# Patient Record
Sex: Female | Born: 1955 | Race: Black or African American | Hispanic: No | Marital: Married | Smoking: Former smoker
Health system: Southern US, Community
[De-identification: ages and names within clinical notes are randomized; demographics above are authoritative.]

## PROBLEM LIST (undated history)

## (undated) DIAGNOSIS — Z800 Family history of malignant neoplasm of digestive organs: Secondary | ICD-10-CM

## (undated) DIAGNOSIS — M797 Fibromyalgia: Secondary | ICD-10-CM

## (undated) DIAGNOSIS — I10 Essential (primary) hypertension: Secondary | ICD-10-CM

## (undated) DIAGNOSIS — Z8051 Family history of malignant neoplasm of kidney: Secondary | ICD-10-CM

## (undated) DIAGNOSIS — R519 Headache, unspecified: Secondary | ICD-10-CM

## (undated) DIAGNOSIS — Z803 Family history of malignant neoplasm of breast: Secondary | ICD-10-CM

## (undated) DIAGNOSIS — E785 Hyperlipidemia, unspecified: Secondary | ICD-10-CM

## (undated) DIAGNOSIS — D649 Anemia, unspecified: Secondary | ICD-10-CM

## (undated) DIAGNOSIS — C801 Malignant (primary) neoplasm, unspecified: Secondary | ICD-10-CM

## (undated) DIAGNOSIS — M7070 Other bursitis of hip, unspecified hip: Secondary | ICD-10-CM

## (undated) DIAGNOSIS — K219 Gastro-esophageal reflux disease without esophagitis: Secondary | ICD-10-CM

## (undated) HISTORY — PX: BREAST BIOPSY: SHX20

## (undated) HISTORY — PX: ABDOMINAL HYSTERECTOMY: SHX81

## (undated) HISTORY — DX: Gastro-esophageal reflux disease without esophagitis: K21.9

## (undated) HISTORY — DX: Family history of malignant neoplasm of breast: Z80.3

## (undated) HISTORY — DX: Family history of malignant neoplasm of digestive organs: Z80.0

## (undated) HISTORY — DX: Other bursitis of hip, unspecified hip: M70.70

## (undated) HISTORY — DX: Hyperlipidemia, unspecified: E78.5

## (undated) HISTORY — DX: Family history of malignant neoplasm of kidney: Z80.51

---

## 2006-12-11 ENCOUNTER — Ambulatory Visit: Payer: Self-pay | Admitting: Cardiology

## 2006-12-12 ENCOUNTER — Ambulatory Visit: Payer: Self-pay | Admitting: Cardiology

## 2006-12-12 ENCOUNTER — Observation Stay (HOSPITAL_COMMUNITY): Admission: AD | Admit: 2006-12-12 | Discharge: 2006-12-13 | Payer: Self-pay | Admitting: Cardiology

## 2006-12-12 HISTORY — PX: LEFT HEART CATH: SHX5946

## 2010-09-26 NOTE — Discharge Summary (Signed)
Christy Lynch, Christy Lynch         ACCOUNT NO.:  [id number]   MEDICAL RECORD NO.:  [id number]          PATIENT TYPE:  INP   LOCATION:  4741                         FACILITY:  [facility]   PHYSICIAN:  Arturo Morton. Riley Kill, MD, FACCDATE OF BIRTH:  17-Jul-1955   DATE OF ADMISSION:  12/12/2006  DATE OF DISCHARGE:  12/13/2006                               DISCHARGE SUMMARY   PROCEDURES:  1. Cardiac catheterization.  2. Coronary arteriogram.  3. Left ventriculogram.   TIME AT DISCHARGE:  35 minutes.   PRIMARY FINAL DISCHARGE DIAGNOSIS:  Chest pain, cardiac enzymes negative  for myocardial infarction and D-dimer negative for pulmonary embolism.   SECONDARY DIAGNOSES:  1. Hypertension.  2. Hyperlipidemia with a total cholesterol of 212, triglycerides 180,      HDL 34, LDL 142 at [facility] this admission.  3. Gastroesophageal reflux disease.  4. Status post hysterectomy.  5. Bursitis in her hips.  6. Obesity.  7. Remote history of tobacco use.   HOSPITAL COURSE:  Christy Lynch is a 55 year old female with no history  of coronary artery disease.  She went to [facility] for chest  pain on December 11, 2006.  Cardiology consult was called and because of the  fact that her chest pain was relieved by nitroglycerin as well as  multiple cardiac risk factors, it was felt that she needed cardiac  catheterization.  She was transferred to [facility]  for this.   The cardiac catheterization showed normal coronary arteries and normal  EF with no MR.  Dr. Excell Seltzer felt it was likely noncardiac chest pain and  a D-dimer was checked which was within normal limits at 0.31.  On December 13, 2006, Christy Lynch was evaluated by Dr. Riley Kill.  She was  ambulating without chest pain or shortness of breath and her EKG was  nonacute.  She was considered stable for discharge and is to follow up  as an outpatient with Dr. Linna Darner and with cardiology on a p.r.n. basis.  Of note, her a Statin was  changed from Pravachol 40 to  Lipitor 80 but  Christy Lynch prefers to remain on the Pravachol 40 and emphasized diet  compliance and follow up with Dr. Linna Darner.   DISCHARGE INSTRUCTIONS:  1. Her activity level is to be increased gradually.  2. She is to call our office for any problems with the cath site.  3. She is to stick to a low sodium heart healthy diet.  4. She is to follow up with Dr. Andee Lineman as needed.  5. She is to follow up with Dr. Linna Darner and call for an appointment.   DISCHARGE MEDICATIONS:  1. Lisinopril/hydrochlorothiazide 20/12.5 mg daily.  2. Nexium 40 mg a day.  3. Pravastatin 40 mg a day.  4. Naprosyn 500 mg b.i.d.Theodore Demark, PA-C      Arturo Morton. Riley Kill, MD, [facility]  Electronically Signed    RB/MEDQ  D:  12/13/2006  T:  12/14/2006  Job:  [id number]   cc:   [facility] in [place], [name]  Erasmo Downer, MD

## 2010-09-26 NOTE — Cardiovascular Report (Signed)
NAMEAMBERIA, BAYLESS         ACCOUNT NO.:  [id number]   MEDICAL RECORD NO.:  [id number]          PATIENT TYPE:  INP   LOCATION:  NA                           FACILITY:  [facility]   PHYSICIAN:  Veverly Fells. Excell Seltzer, MD  DATE OF BIRTH:  1955-05-29   DATE OF PROCEDURE:  12/12/2006  DATE OF DISCHARGE:                            CARDIAC CATHETERIZATION   PROCEDURE:  Left heart catheterization, selective coronary angiography,  left ventricular angiography, Starclose of the right femoral artery.   INDICATIONS:  Ms. Suddeth is a 55 year old woman with hypertension.  She experienced severe substernal chest pain.  She had a very mildly  elevated troponin at 0.11.  Her symptoms have greatly resolved, but in  the setting of her troponin elevation, she was referred for cardiac  catheterization.   Risks and indications of the procedure were explained to the patient.  Informed consent was obtained.  The right groin was prepped, draped and  anesthetized with 1% lidocaine.  Using the modified Seldinger technique,  a 6-French sheath was placed in the right femoral artery.  Multiple  views of the right and left coronary arteries were taken using standard  Judkins catheters.  Following selective coronary angiography, an angled  pigtail catheter was inserted into the left ventricle, and pressures  were recorded.  A left ventriculogram was performed.  A pullback across  the aortic valve was done at the completion of the procedure.  A  Starclose device was used to seal the femoral arteriotomy.  All catheter  exchanges were performed over a guidewire.  There were no immediate  complications.   FINDINGS:  Aortic pressure 124/68 with a mean of 92, left ventricular  pressure 124/8.   The left mainstem is angiographically normal.  It bifurcates into the  LAD and left circumflex.   The LAD is a large-caliber vessel that courses down and wraps around  left ventricular apex.  There is very small first  diagonal branch and a  medium-size second diagonal branch.  There is no significant  angiographic disease throughout the LAD or any of its diagonal branches.   Left circumflex is a large-caliber vessel, courses down and provides a  large first OM branch that bifurcates into twin vessels.  The true AV  groove circumflex beyond the OM branch is very small.  There is no  significant angiographic disease in the left circumflex.   The right coronary artery is large and dominant.  There is no  significant angiographic stenosis in the proximal mid or distal portion  of the right coronary artery.  Distally, it bifurcates into a PDA and  posterior AV segment which provides 3 posterolateral branches, all of  which are small.  There is no significant angiographic disease in any of  the branch vessels of the right coronary artery.   Left ventricular function is normal.  The LVEF is 60%.  There is no  mitral regurgitation.   ASSESSMENT:  1. Normal coronary arteries.  2. Normal left ventricular function.   Will watch Ms. Boehle overnight and plan on discharge home tomorrow  morning as her symptoms have resolved, and she has normal coronary  arteries.  It is difficult to explain her mild troponin increase in the  setting of widely patent coronary arteries.      Veverly Fells. Excell Seltzer, MD  Electronically Signed     MDC/MEDQ  D:  12/12/2006  T:  12/13/2006  Job:  [id number]   cc:   Learta Codding, MD,FACC  Erasmo Downer, MD

## 2011-02-26 LAB — CBC
HCT: 39.3
MCHC: 34.3
MCV: 86.1
Platelets: 254
WBC: 6.7

## 2011-02-26 LAB — BASIC METABOLIC PANEL
BUN: 12
CO2: 31
Chloride: 101
Creatinine, Ser: 0.9
Glucose, Bld: 96
Potassium: 4

## 2011-02-26 LAB — TSH: TSH: 1.423

## 2013-03-23 ENCOUNTER — Encounter (HOSPITAL_COMMUNITY): Payer: Self-pay | Admitting: Emergency Medicine

## 2013-03-23 ENCOUNTER — Emergency Department (HOSPITAL_COMMUNITY): Payer: PRIVATE HEALTH INSURANCE

## 2013-03-23 ENCOUNTER — Emergency Department (HOSPITAL_COMMUNITY)
Admission: EM | Admit: 2013-03-23 | Discharge: 2013-03-23 | Disposition: A | Discharge status: Home / Self Care | Payer: PRIVATE HEALTH INSURANCE | Attending: Emergency Medicine | Admitting: Emergency Medicine

## 2013-03-23 DIAGNOSIS — R079 Chest pain, unspecified: Secondary | ICD-10-CM

## 2013-03-23 DIAGNOSIS — I10 Essential (primary) hypertension: Secondary | ICD-10-CM | POA: Insufficient documentation

## 2013-03-23 DIAGNOSIS — Z79899 Other long term (current) drug therapy: Secondary | ICD-10-CM | POA: Insufficient documentation

## 2013-03-23 DIAGNOSIS — R0789 Other chest pain: Secondary | ICD-10-CM | POA: Insufficient documentation

## 2013-03-23 DIAGNOSIS — Z87891 Personal history of nicotine dependence: Secondary | ICD-10-CM | POA: Insufficient documentation

## 2013-03-23 HISTORY — DX: Essential (primary) hypertension: I10

## 2013-03-23 LAB — COMPREHENSIVE METABOLIC PANEL
ALT: 25 U/L (ref 0–35)
Alkaline Phosphatase: 93 U/L (ref 39–117)
BUN: 13 mg/dL (ref 6–23)
CO2: 29 mEq/L (ref 19–32)
Calcium: 9.5 mg/dL (ref 8.4–10.5)
GFR calc Af Amer: 81 mL/min — ABNORMAL LOW (ref >90)
GFR calc non Af Amer: 70 mL/min — ABNORMAL LOW (ref >90)
Glucose, Bld: 96 mg/dL (ref 70–99)
Sodium: 142 mEq/L (ref 135–145)

## 2013-03-23 LAB — CBC WITH DIFFERENTIAL/PLATELET
Basophils Absolute: 0 10*3/uL (ref 0.0–0.1)
Basophils Relative: 0 % (ref 0–1)
Hemoglobin: 12.5 g/dL (ref 12.0–15.0)
Lymphocytes Relative: 31 % (ref 12–46)
MCHC: 33.2 g/dL (ref 30.0–36.0)
Neutro Abs: 4.4 10*3/uL (ref 1.7–7.7)
Neutrophils Relative %: 58 % (ref 43–77)
RDW: 13.6 % (ref 11.5–15.5)
WBC: 7.6 10*3/uL (ref 4.0–10.5)

## 2013-03-23 LAB — POCT I-STAT 3, ART BLOOD GAS (G3+)
Patient temperature: 37
pH, Arterial: 7.58 — ABNORMAL HIGH (ref 7.350–7.450)

## 2013-03-23 LAB — POCT I-STAT TROPONIN I

## 2013-03-23 NOTE — ED Notes (Signed)
Patient transported to X-ray 

## 2013-03-23 NOTE — ED Notes (Signed)
Patient is alert and orientedx4.  Patient was explained discharge instructions and they understood them with no questions.   

## 2013-03-23 NOTE — ED Provider Notes (Signed)
CSN: [id number]     Arrival date & time 03/23/13  1413 History   First MD Initiated Contact with Patient 03/23/13 1517     Chief Complaint  Patient presents with  . Chest Pain   (Consider location/radiation/quality/duration/timing/severity/associated sxs/prior Treatment) Patient is a 57 y.o. female presenting with chest pain. The history is provided by the patient.  Chest Pain Pain location:  L chest Associated symptoms: no abdominal pain, no back pain, no headache, no nausea, no numbness, no shortness of breath, not vomiting and no weakness    patient has had left-sided chest pain since Saturday evening. It is constant. It is sort of under her left arm. It is not worse with movement. No difficulty breathing. It is worse with a deep breath. No trauma. She does not smoke. She's on no hormones. No swelling or legs. No recent travel. She had a clean heart cath in 2008.  Past Medical History  Diagnosis Date  . Hypertension    Past Surgical History  Procedure Laterality Date  . Abdominal hysterectomy     No family history on file. History  Substance Use Topics  . Smoking status: Former Games developer  . Smokeless tobacco: Not on file  . Alcohol Use: No   OB History   Grav Para Term Preterm Abortions TAB SAB Ect Mult Living                 Review of Systems  Constitutional: Negative for activity change and appetite change.  Eyes: Negative for pain.  Respiratory: Negative for chest tightness and shortness of breath.   Cardiovascular: Positive for chest pain. Negative for leg swelling.  Gastrointestinal: Negative for nausea, vomiting, abdominal pain and diarrhea.  Genitourinary: Negative for flank pain.  Musculoskeletal: Negative for back pain and neck stiffness.  Skin: Negative for rash.  Neurological: Negative for weakness, numbness and headaches.  Psychiatric/Behavioral: Negative for behavioral problems.    Allergies  Flagyl  Home Medications   Current Outpatient Rx  Name   Route  Sig  Dispense  Refill  . allopurinol (ZYLOPRIM) 100 MG tablet   Oral   Take 100 mg by mouth every evening.         Marland Kitchen atenolol (TENORMIN) 50 MG tablet   Oral   Take 50 mg by mouth every evening.         . cyclobenzaprine (FLEXERIL) 10 MG tablet   Oral   Take 10 mg by mouth at bedtime as needed for muscle spasms.         . DULoxetine (CYMBALTA) 60 MG capsule   Oral   Take 60 mg by mouth every evening.         Marland Kitchen esomeprazole (NEXIUM) 40 MG capsule   Oral   Take 40 mg by mouth daily at 12 noon.         Marland Kitchen lisinopril-hydrochlorothiazide (PRINZIDE,ZESTORETIC) 20-12.5 MG per tablet   Oral   Take 1 tablet by mouth daily.         . Multiple Vitamin (MULTIVITAMIN WITH MINERALS) TABS tablet   Oral   Take 1 tablet by mouth every evening.          BP 144/89  Pulse 65  Resp 18  SpO2 100% Physical Exam  Nursing note and vitals reviewed. Constitutional: She is oriented to person, place, and time. She appears well-developed and well-nourished.  HENT:  Head: Normocephalic and atraumatic.  Eyes: EOM are normal. Pupils are equal, round, and reactive to light.  Neck: Normal  range of motion. Neck supple.  Cardiovascular: Normal rate, regular rhythm and normal heart sounds.   No murmur heard. Pulmonary/Chest: Effort normal and breath sounds normal. No respiratory distress. She has no wheezes. She has no rales. She exhibits tenderness.  Tenderness to left chest wall near her axillary region. No rash. No deformity  Abdominal: Soft. Bowel sounds are normal. She exhibits no distension. There is no tenderness. There is no rebound and no guarding.  Musculoskeletal: Normal range of motion.  Neurological: She is alert and oriented to person, place, and time. No cranial nerve deficit.  Skin: Skin is warm and dry.  Psychiatric: She has a normal mood and affect. Her speech is normal.    ED Course  Procedures (including critical care time) Labs Review Labs Reviewed   COMPREHENSIVE METABOLIC PANEL - Abnormal; Notable for the following:    GFR calc non Af Amer 70 (*)    GFR calc Af Amer 81 (*)    All other components within normal limits  POCT I-STAT 3, BLOOD GAS (G3+) - Abnormal; Notable for the following:    pH, Arterial 7.580 (*)    pCO2 arterial 22.5 (*)    pO2, Arterial 55.0 (*)    All other components within normal limits  PRO B NATRIURETIC PEPTIDE  CBC WITH DIFFERENTIAL  POCT I-STAT TROPONIN I   Imaging Review Dg Chest 2 View  03/23/2013   CLINICAL DATA:  History of tobacco use. Now complaining of left-sided chest discomfort.  EXAM: CHEST  2 VIEW  COMPARISON:  None.  FINDINGS: The lungs are adequately inflated and clear. The cardiac silhouette is normal in size. The pulmonary vascularity is not engorged. There is mild tortuosity of the descending thoracic aorta. There is no pleural effusion or pneumothorax or pneumomediastinum. The observed portions of the bony thorax are normal.  IMPRESSION: There is no evidence of pneumonia or CHF nor other acute cardiopulmonary abnormality.   Electronically Signed   By: David  [place]   On: 03/23/2013 15:28    EKG Interpretation     Ventricular Rate:  66 PR Interval:  166 QRS Duration: 82 QT Interval:  394 QTC Calculation: 413 R Axis:   9 Text Interpretation:  Normal sinus rhythm Nonspecific T wave abnormality Abnormal ECG            MDM   1. Chest pain    Patient with chest pain. Doubt cardiac cause. 7 years ago had a clean cath. No risk factors for pulmonary embolism. Will discharged home.    Juliet Rude. Rubin Payor, MD 03/23/13 [phone]

## 2013-03-23 NOTE — ED Notes (Signed)
Patient is resting comfortably. 

## 2013-03-23 NOTE — ED Notes (Signed)
Pt states constant chest pain to left chest to left shoulder, left neck, and left arm.  Pt reports sob.  No nausea or diaphoresis.

## 2013-05-18 ENCOUNTER — Other Ambulatory Visit: Payer: Self-pay | Admitting: Family Medicine

## 2013-05-18 DIAGNOSIS — R110 Nausea: Secondary | ICD-10-CM

## 2013-05-18 DIAGNOSIS — R1011 Right upper quadrant pain: Secondary | ICD-10-CM

## 2013-05-18 DIAGNOSIS — R1013 Epigastric pain: Secondary | ICD-10-CM

## 2013-05-19 ENCOUNTER — Ambulatory Visit
Admission: RE | Admit: 2013-05-19 | Discharge: 2013-05-19 | Disposition: A | Discharge status: Home / Self Care | Payer: 59 | Source: Ambulatory Visit | Attending: Family Medicine | Admitting: Family Medicine

## 2013-05-19 DIAGNOSIS — R110 Nausea: Secondary | ICD-10-CM

## 2013-05-19 DIAGNOSIS — R1013 Epigastric pain: Secondary | ICD-10-CM

## 2013-05-19 DIAGNOSIS — R1011 Right upper quadrant pain: Secondary | ICD-10-CM

## 2013-05-21 ENCOUNTER — Other Ambulatory Visit: Payer: Self-pay | Admitting: Family Medicine

## 2013-05-21 DIAGNOSIS — K769 Liver disease, unspecified: Secondary | ICD-10-CM

## 2013-05-25 ENCOUNTER — Ambulatory Visit
Admission: RE | Admit: 2013-05-25 | Discharge: 2013-05-25 | Disposition: A | Discharge status: Home / Self Care | Payer: 59 | Source: Ambulatory Visit | Attending: Family Medicine | Admitting: Family Medicine

## 2013-05-25 DIAGNOSIS — K769 Liver disease, unspecified: Secondary | ICD-10-CM

## 2013-05-25 MED ORDER — IOHEXOL 300 MG/ML  SOLN
125.0000 mL | Freq: Once | INTRAMUSCULAR | Status: AC | PRN
  Administered 2013-05-25: 125 mL via INTRAVENOUS

## 2013-05-25 MED ORDER — IOHEXOL 300 MG/ML  SOLN: 100.0000 mL | Freq: Once | INTRAMUSCULAR | Status: DC | PRN

## 2013-05-27 ENCOUNTER — Other Ambulatory Visit (HOSPITAL_COMMUNITY)
Admission: RE | Admit: 2013-05-27 | Discharge: 2013-05-27 | Disposition: A | Discharge status: Home / Self Care | Payer: 59 | Source: Ambulatory Visit | Attending: Family Medicine | Admitting: Family Medicine

## 2013-05-27 ENCOUNTER — Other Ambulatory Visit: Payer: Self-pay | Admitting: Family Medicine

## 2013-05-27 DIAGNOSIS — Z124 Encounter for screening for malignant neoplasm of cervix: Secondary | ICD-10-CM | POA: Insufficient documentation

## 2013-06-04 ENCOUNTER — Other Ambulatory Visit: Payer: Self-pay | Admitting: Gastroenterology

## 2013-06-30 ENCOUNTER — Institutional Professional Consult (permissible substitution): Payer: 59 | Admitting: Cardiology

## 2013-07-02 ENCOUNTER — Institutional Professional Consult (permissible substitution): Payer: 59 | Admitting: Cardiology

## 2013-07-02 ENCOUNTER — Encounter: Payer: Self-pay | Admitting: *Deleted

## 2013-07-02 ENCOUNTER — Encounter: Payer: Self-pay | Admitting: Cardiology

## 2013-07-02 ENCOUNTER — Ambulatory Visit (INDEPENDENT_AMBULATORY_CARE_PROVIDER_SITE_OTHER): Payer: 59 | Admitting: Cardiology

## 2013-07-02 VITALS — BP 128/82 | HR 61 | Ht 64.0 in | Wt 235.0 lb

## 2013-07-02 DIAGNOSIS — R079 Chest pain, unspecified: Secondary | ICD-10-CM

## 2013-07-02 DIAGNOSIS — I10 Essential (primary) hypertension: Secondary | ICD-10-CM | POA: Insufficient documentation

## 2013-07-02 DIAGNOSIS — E785 Hyperlipidemia, unspecified: Secondary | ICD-10-CM

## 2013-07-02 NOTE — Patient Instructions (Signed)
**Note De-Identified Christy Lynch Obfuscation** Your physician has requested that you have an exercise tolerance test. For further information please visit [email or web address]. Please also follow instruction sheet, as given.  Your physician recommends that you schedule a follow-up appointment in: after test

## 2013-07-02 NOTE — Progress Notes (Signed)
Patient ID: Christy Lynch, female   DOB: 1956-04-19, 58 y.o.   MRN: [id number]     Patient Name: Christy Lynch Date of Encounter: 07/02/2013  Primary Care Provider:  Antony Blackbird, MD Primary Cardiologist:  Ena Dawley, H  Problem List   Past Medical History  Diagnosis Date  . Hypertension   . Other and unspecified hyperlipidemia   . Esophageal reflux   . Bursitis of hip    Past Surgical History  Procedure Laterality Date  . Abdominal hysterectomy    . Left heart cath  12/12/2006   Allergies  Allergies  Allergen Reactions  . Flagyl [Metronidazole] Nausea And Vomiting and Other (See Comments)    headache   HPI  A very pleasant 58 year old female with prior medical history of well controlled hypertension and hyperlipidemia. The patient presented to the ER in November of 2014 with left-sided chest pain that was constant for about a week radiating to her back and right arm. The pain was not positional and nonexertional. There was no clear exacerbating or alleviating factor. The patient is fairly active taking care of her granddaughter, and she denies any palpitations, dizziness, shortness of breath orthopnea, or lower extremity edema. She has been followed for her hypertension and hyperlipidemia by her primary care physician. She states that her lipids at the last checkup were at goal. She had a clean heart cath in 2008. She has no family history of coronary artery disease. The patient smoked but quit 20 years ago.  Home Medications  Prior to Admission medications   Medication Sig Start Date End Date Taking? Authorizing Provider  allopurinol (ZYLOPRIM) 100 MG tablet Take 100 mg by mouth every evening.   Yes Historical Provider, MD  atenolol (TENORMIN) 50 MG tablet Take 50 mg by mouth every evening.   Yes Historical Provider, MD  cyclobenzaprine (FLEXERIL) 10 MG tablet Take 10 mg by mouth at bedtime as needed for muscle spasms.   Yes Historical Provider, MD    DULoxetine (CYMBALTA) 60 MG capsule Take 60 mg by mouth every evening.   Yes Historical Provider, MD  esomeprazole (NEXIUM) 40 MG capsule Take 40 mg by mouth daily at 12 noon.   Yes Historical Provider, MD  lisinopril-hydrochlorothiazide (PRINZIDE,ZESTORETIC) 20-12.5 MG per tablet Take 1 tablet by mouth daily.   Yes Historical Provider, MD  Multiple Vitamin (MULTIVITAMIN WITH MINERALS) TABS tablet Take 1 tablet by mouth every evening.   Yes Historical Provider, MD    Family History  Family History  Problem Relation Age of Onset  . Hypertension Mother   . Stroke Mother   . Kidney disease Father     Social History  History   Social History  . Marital Status: Married    Spouse Name: N/A    Number of Children: N/A  . Years of Education: N/A   Occupational History  . Not on file.   Social History Main Topics  . Smoking status: Former Research scientist (life sciences)  . Smokeless tobacco: Not on file  . Alcohol Use: No  . Drug Use: No  . Sexual Activity: Not on file   Other Topics Concern  . Not on file   Social History Narrative  . No narrative on file     Review of Systems, as per HPI, otherwise negative General:  No chills, fever, night sweats or weight changes.  Cardiovascular:  No chest pain, dyspnea on exertion, edema, orthopnea, palpitations, paroxysmal nocturnal dyspnea. Dermatological: No rash, lesions/masses Respiratory: No cough, dyspnea Urologic: No hematuria,  dysuria Abdominal:   No nausea, vomiting, diarrhea, bright red blood per rectum, melena, or hematemesis Neurologic:  No visual changes, wkns, changes in mental status. All other systems reviewed and are otherwise negative except as noted above.  Physical Exam  Blood pressure 128/82, pulse 61, height 5\' 4"  (1.626 m), weight 235 lb (106.595 kg).  General: Pleasant, NAD Psych: Normal affect. Neuro: Alert and oriented X 3. Moves all extremities spontaneously. HEENT: Normal  Neck: Supple without bruits or JVD. Lungs:  Resp  regular and unlabored, CTA. Heart: RRR no s3, s4, or murmurs. Abdomen: Soft, non-tender, non-distended, BS + x 4.  Extremities: No clubbing, cyanosis or edema. DP/PT/Radials 2+ and equal bilaterally.  Labs:  No results found for this basename: CKTOTAL, CKMB, TROPONINI,  in the last 72 hours Lab Results  Component Value Date   WBC 7.6 03/23/2013   HGB 12.5 03/23/2013   HCT 37.7 03/23/2013   MCV 88.1 03/23/2013   PLT 262 03/23/2013   No results found for this basename: NA, K, CL, CO2, BUN, CREATININE, CALCIUM, LABALBU, PROT, BILITOT, ALKPHOS, ALT, AST, GLUCOSE,  in the last 168 hours No results found for this basename: CHOL, HDL, LDLCALC, TRIG   Lab Results  Component Value Date   DDIMER  Value: 0.31        AT THE INHOUSE ESTABLISHED CUTOFF VALUE OF 0.48 ug/mL FEU, THIS ASSAY HAS BEEN DOCUMENTED IN THE LITERATURE TO HAVE 12/13/2006   No components found with this basename: POCBNP,   Accessory Clinical Findings  Echocardiogram - none  ECG - normal sinus rhythm, normal EKG   Assessment & Plan  58 year old female  1. Atypical chest pain - considering risk factors that include hypertension, hyperlipidemia and obesity we will order an exercise treadmill stress test to rule out ischemia.  2. Hypertension - well controlled on current regimen and will continue.  3. Hyperlipidemia - followed by primary care physician currently on 20 mg of simvastatin daily  Followup in one month.  Ena Dawley, Lemmie Evens, MD, [facility] 07/02/2013, 12:09 PM

## 2013-07-09 ENCOUNTER — Encounter (HOSPITAL_COMMUNITY): Payer: 59

## 2013-07-13 ENCOUNTER — Telehealth (HOSPITAL_COMMUNITY): Payer: Self-pay | Admitting: *Deleted

## 2013-07-17 ENCOUNTER — Ambulatory Visit: Payer: 59 | Admitting: Cardiology

## 2013-07-23 ENCOUNTER — Ambulatory Visit: Payer: 59 | Admitting: Cardiology

## 2013-08-21 ENCOUNTER — Ambulatory Visit (INDEPENDENT_AMBULATORY_CARE_PROVIDER_SITE_OTHER): Payer: BC Managed Care – PPO | Admitting: Cardiology

## 2013-08-21 ENCOUNTER — Encounter: Payer: Self-pay | Admitting: Cardiology

## 2013-08-21 VITALS — BP 152/86 | HR 64 | Ht 65.0 in | Wt 233.0 lb

## 2013-08-21 DIAGNOSIS — R079 Chest pain, unspecified: Secondary | ICD-10-CM

## 2013-08-21 DIAGNOSIS — R42 Dizziness and giddiness: Secondary | ICD-10-CM

## 2013-08-21 MED ORDER — MECLIZINE HCL 25 MG PO TABS: 25.0000 mg | ORAL_TABLET | Freq: Two times a day (BID) | ORAL | Status: DC | PRN

## 2013-08-21 NOTE — Progress Notes (Signed)
Patient ID: Christy Lynch, female   DOB: 08/08/1955, 58 y.o.   MRN: [id number]    Patient Name: Christy Lynch Date of Encounter: 08/21/2013  Primary Care Provider:  Antony Blackbird, MD Primary Cardiologist:  Dorothy Spark  Problem List   Past Medical History  Diagnosis Date  . Hypertension   . Other and unspecified hyperlipidemia   . Esophageal reflux   . Bursitis of hip    Past Surgical History  Procedure Laterality Date  . Abdominal hysterectomy    . Left heart cath  12/12/2006   Allergies  Allergies  Allergen Reactions  . Flagyl [Metronidazole] Nausea And Vomiting and Other (See Comments)    headache   HPI  A very pleasant 58 year old female with prior medical history of well controlled hypertension and hyperlipidemia. The patient presented to the ER in November of 2014 with left-sided chest pain that was constant for about a week radiating to her back and right arm. The pain was not positional and nonexertional. There was no clear exacerbating or alleviating factor. The patient is fairly active taking care of her granddaughter, and she denies any palpitations, dizziness, shortness of breath orthopnea, or lower extremity edema. She has been followed for her hypertension and hyperlipidemia by her primary care physician. She states that her lipids at the last checkup were at goal. She had a clean heart cath in 2008. She has no family history of coronary artery disease. The patient smoked but quit 20 years ago.  The patient is coming after 2 months, she hasn't had a stress test yet as it was canceled to do snow day. In the meanwhile she was prescribed meclizine for vertigo in the ER where she went for dizziness associated with chest discomfort. Meclizine helped her significantly but the symptoms came back since she ran out of it.  Home Medications  Prior to Admission medications   Medication Sig Start Date End Date Taking? Authorizing Provider  allopurinol  (ZYLOPRIM) 100 MG tablet Take 100 mg by mouth every evening.   Yes Historical Provider, MD  atenolol (TENORMIN) 50 MG tablet Take 50 mg by mouth every evening.   Yes Historical Provider, MD  cyclobenzaprine (FLEXERIL) 10 MG tablet Take 10 mg by mouth at bedtime as needed for muscle spasms.   Yes Historical Provider, MD  DULoxetine (CYMBALTA) 60 MG capsule Take 60 mg by mouth every evening.   Yes Historical Provider, MD  esomeprazole (NEXIUM) 40 MG capsule Take 40 mg by mouth daily at 12 noon.   Yes Historical Provider, MD  lisinopril-hydrochlorothiazide (PRINZIDE,ZESTORETIC) 20-12.5 MG per tablet Take 1 tablet by mouth daily.   Yes Historical Provider, MD  Multiple Vitamin (MULTIVITAMIN WITH MINERALS) TABS tablet Take 1 tablet by mouth every evening.   Yes Historical Provider, MD    Family History  Family History  Problem Relation Age of Onset  . Hypertension Mother   . Stroke Mother   . Kidney disease Father     Social History  History   Social History  . Marital Status: Married    Spouse Name: N/A    Number of Children: N/A  . Years of Education: N/A   Occupational History  . Not on file.   Social History Main Topics  . Smoking status: Former Research scientist (life sciences)  . Smokeless tobacco: Not on file  . Alcohol Use: No  . Drug Use: No  . Sexual Activity: Not on file   Other Topics Concern  . Not on file  Social History Narrative  . No narrative on file     Review of Systems, as per HPI, otherwise negative General:  No chills, fever, night sweats or weight changes.  Cardiovascular:  No chest pain, dyspnea on exertion, edema, orthopnea, palpitations, paroxysmal nocturnal dyspnea. Dermatological: No rash, lesions/masses Respiratory: No cough, dyspnea Urologic: No hematuria, dysuria Abdominal:   No nausea, vomiting, diarrhea, bright red blood per rectum, melena, or hematemesis Neurologic:  No visual changes, wkns, changes in mental status. All other systems reviewed and are  otherwise negative except as noted above.  Physical Exam  Blood pressure 152/86, pulse 64, height 5\' 5"  (1.651 m), weight 233 lb (105.688 kg).  General: Pleasant, NAD Psych: Normal affect. Neuro: Alert and oriented X 3. Moves all extremities spontaneously. HEENT: Normal  Neck: Supple without bruits or JVD. Lungs:  Resp regular and unlabored, CTA. Heart: RRR no s3, s4, or murmurs. Abdomen: Soft, non-tender, non-distended, BS + x 4.  Extremities: No clubbing, cyanosis or edema. DP/PT/Radials 2+ and equal bilaterally.  Labs:  No results found for this basename: CKTOTAL, CKMB, TROPONINI,  in the last 72 hours Lab Results  Component Value Date   WBC 7.6 03/23/2013   HGB 12.5 03/23/2013   HCT 37.7 03/23/2013   MCV 88.1 03/23/2013   PLT 262 03/23/2013   No results found for this basename: NA, K, CL, CO2, BUN, CREATININE, CALCIUM, LABALBU, PROT, BILITOT, ALKPHOS, ALT, AST, GLUCOSE,  in the last 168 hours No results found for this basename: CHOL,  HDL,  LDLCALC,  TRIG   Lab Results  Component Value Date   DDIMER  Value: 0.31        AT THE INHOUSE ESTABLISHED CUTOFF VALUE OF 0.48 ug/mL FEU, THIS ASSAY HAS BEEN DOCUMENTED IN THE LITERATURE TO HAVE 12/13/2006   Accessory Clinical Findings  Echocardiogram - none  ECG - normal sinus rhythm, normal EKG   Assessment & Plan  58 year old female  1. Atypical chest pain - considering risk factors that include hypertension, hyperlipidemia and obesity we will reorder a Lexiscan nuclear stress test to rule out ischemia. We will prescribe her meclizine 25 mg po BID.  2. Hypertension - well controlled on current regimen and will continue.  3. Hyperlipidemia - followed by primary care physician currently on 20 mg of simvastatin daily  If negative stress test follow up in 1 year.  Dorothy Spark, MD, [facility] 08/21/2013, 9:53 AM

## 2013-08-21 NOTE — Patient Instructions (Signed)
Your physician has recommended you make the following change in your medication:   1. Start Meclizine 25 mg twice a day as needed for dizziness.   Your physician has requested that you have a lexiscan myoview. For further information please visit [email or web address]. Please follow instruction sheet, as given.  Marland Kitchen

## 2013-09-03 ENCOUNTER — Ambulatory Visit (HOSPITAL_COMMUNITY): Payer: BC Managed Care – PPO | Attending: Cardiology | Admitting: Radiology

## 2013-09-03 VITALS — BP 136/85 | HR 85 | Ht 66.0 in | Wt 228.0 lb

## 2013-09-03 DIAGNOSIS — R079 Chest pain, unspecified: Secondary | ICD-10-CM | POA: Insufficient documentation

## 2013-09-03 MED ORDER — TECHNETIUM TC 99M SESTAMIBI GENERIC - CARDIOLITE
10.0000 | Freq: Once | INTRAVENOUS | Status: AC | PRN
  Administered 2013-09-03: 10 via INTRAVENOUS

## 2013-09-03 MED ORDER — REGADENOSON 0.4 MG/5ML IV SOLN
0.4000 mg | Freq: Once | INTRAVENOUS | Status: AC
  Administered 2013-09-03: 0.4 mg via INTRAVENOUS

## 2013-09-03 MED ORDER — TECHNETIUM TC 99M SESTAMIBI GENERIC - CARDIOLITE
30.0000 | Freq: Once | INTRAVENOUS | Status: AC | PRN
  Administered 2013-09-03: 30 via INTRAVENOUS

## 2013-09-03 NOTE — Progress Notes (Signed)
[facility] [place], [place] [address] [phone]    Cardiology Nuclear Med Study  SHELBI VACCARO is a 58 y.o. female     MRN : [id number]     DOB: 20-Jul-1955  Procedure Date: 09/03/2013  Nuclear Med Background Indication for Stress Test:  Evaluation for Ischemia and Hospitalized 04-2013 with Chest Pain History:  No prior known history of CAD, and Cath Cardiac Risk Factors: History of Smoking, Hypertension and Lipids  Symptoms: Chest pain with/without exertion (last occurrence 04-2013),Dizziness   Nuclear Pre-Procedure Caffeine/Decaff Intake:  None NPO After: 7:00pm   Lungs:  clear O2 Sat: 95% on room air. IV 0.9% NS with Angio Cath:  22g  IV Site: R Antecubital  IV Started by:  Crissie Figures, RN  Chest Size (in):  40 Cup Size: B++  Height: 5\' 6"  (1.676 m)  Weight:  228 lb (103.42 kg)  BMI:  Body mass index is 36.82 kg/(m^2). Tech Comments:  Held Atenolol this am.    Nuclear Med Study 1 or 2 day study: 1 day  Stress Test Type:  Lexiscan  Reading MD: N/A  Order Authorizing Provider:  Ena Dawley, MD  Resting Radionuclide: Technetium 6m Sestamibi  Resting Radionuclide Dose: 11.0 mCi   Stress Radionuclide:  Technetium 51m Sestamibi  Stress Radionuclide Dose: 33.0 mCi           Stress Protocol Rest HR: 85 Stress HR: 93  Rest BP: 136/85 Stress BP: 152/63  Exercise Time (min): n/a METS: n/a   Predicted Max HR: 162 bpm % Max HR: 57.41 bpm Rate Pressure Product: 14136   Dose of Adenosine (mg):  n/a Dose of Lexiscan: 0.4 mg  Dose of Atropine (mg): n/a Dose of Dobutamine: n/a mcg/kg/min (at max HR)  Stress Test Technologist: Irven Baltimore, RN  Nuclear Technologist:  Charlton Amor, CNMT     Rest Procedure:  Myocardial perfusion imaging was performed at rest 45 minutes following the intravenous administration of Technetium 34m Sestamibi. Rest ECG: NSR, nonspecific Twave changes.  Stress Procedure:  The patient  received IV Lexiscan 0.4 mg over 15-seconds.  Technetium 72m Sestamibi injected at 30-seconds. The patient complained of headache, but denied chest pain. Quantitative spect images were obtained after a 45 minute delay. Stress ECG: No significant ST segment change suggestive of ischemia.  QPS Raw Data Images:  There is a breast shadow that accounts for the anterior attenuation. Stress Images:  There is decreased uptake in the anterior wall. Rest Images:  There is decreased uptake in the anterior wall. Subtraction (SDS):  No evidence of ischemia. Transient Ischemic Dilatation (Normal <1.22):  1.01 Lung/Heart Ratio (Normal <0.45):  0.31  Quantitative Gated Spect Images QGS EDV:  79 ml QGS ESV:  20 ml  Impression Exercise Capacity:  Lexiscan with no exercise. BP Response:  Normal blood pressure response. Clinical Symptoms:  No chest pain or dyspnea. ECG Impression:  No significant ST segment change suggestive of ischemia. Comparison with Prior Nuclear Study: No previous nuclear study performed  Overall Impression:  Normal stress nuclear study with a moderate size, mild intensity, fixed anterior defect consistent with breast attenuation; no ischemia.  LV Ejection Fraction: 75%.  LV Wall Motion:  NL LV Function; NL Wall Motion  Christy Lynch

## 2013-09-07 ENCOUNTER — Telehealth: Payer: Self-pay | Admitting: *Deleted

## 2013-09-07 NOTE — Telephone Encounter (Signed)
Contacted pt about her normal stress test results and normal heart function per Dr. Meda Coffee. Told pt that she has no prior infarct or ischemia per Dr Meda Coffee.  Pt verbalized understanding and is pleased with this information.

## 2013-09-07 NOTE — Telephone Encounter (Signed)
Message copied by Nuala Alpha on Mon Sep 07, 2013  8:14 AM ------      Message from: Dorothy Spark      Created: Mon Sep 07, 2013  8:04 AM       Normal stress test, normal heart function, no prior infarct or ischemia.      Would you let her know?      Thank you,      K ------

## 2014-11-08 ENCOUNTER — Other Ambulatory Visit (HOSPITAL_COMMUNITY): Payer: Self-pay | Admitting: Orthopaedic Surgery

## 2014-11-08 DIAGNOSIS — M25562 Pain in left knee: Secondary | ICD-10-CM

## 2014-11-18 ENCOUNTER — Ambulatory Visit (HOSPITAL_COMMUNITY)
Admission: RE | Admit: 2014-11-18 | Discharge: 2014-11-18 | Disposition: A | Discharge status: Home / Self Care | Payer: BLUE CROSS/BLUE SHIELD | Source: Ambulatory Visit | Attending: Orthopaedic Surgery | Admitting: Orthopaedic Surgery

## 2014-11-18 DIAGNOSIS — M659 Synovitis and tenosynovitis, unspecified: Secondary | ICD-10-CM | POA: Insufficient documentation

## 2014-11-18 DIAGNOSIS — M25562 Pain in left knee: Secondary | ICD-10-CM | POA: Insufficient documentation

## 2014-11-18 DIAGNOSIS — M23303 Other meniscus derangements, unspecified medial meniscus, right knee: Secondary | ICD-10-CM | POA: Diagnosis not present

## 2014-11-18 DIAGNOSIS — M25462 Effusion, left knee: Secondary | ICD-10-CM | POA: Diagnosis not present

## 2014-11-18 DIAGNOSIS — M23301 Other meniscus derangements, unspecified lateral meniscus, left knee: Secondary | ICD-10-CM | POA: Diagnosis not present

## 2015-06-23 ENCOUNTER — Telehealth: Payer: Self-pay | Admitting: Orthopaedic Surgery

## 2015-06-23 MED ORDER — HYDROCODONE-ACETAMINOPHEN 5-325 MG PO TABS: 1.0000 | ORAL_TABLET | ORAL | Status: DC | PRN

## 2015-06-23 NOTE — Telephone Encounter (Signed)
Rx [employer]

## 2015-06-23 NOTE — Telephone Encounter (Signed)
Patient is requesting refill on Hydrocodone 5/325mg  Qty 120 Tabs. She asked that you be sure to put directions.

## 2015-06-23 NOTE — Telephone Encounter (Signed)
Left message for patient to return call. Prescription is ready for pick up.

## 2015-10-12 ENCOUNTER — Telehealth: Payer: Self-pay | Admitting: Orthopaedic Surgery

## 2015-10-12 MED ORDER — HYDROCODONE-ACETAMINOPHEN 5-325 MG PO TABS: 1.0000 | ORAL_TABLET | ORAL | Status: DC | PRN

## 2015-10-12 NOTE — Telephone Encounter (Signed)
Rx done. 

## 2015-10-12 NOTE — Telephone Encounter (Signed)
Hydrocodone-Acetaminophen 5-325 mgs.  Qty  120   Sig: Take 1 tablet by mouth every 4 (four) hours as needed for moderate pain (Must last 30 days. Do not take and drive a car or use machinery.).

## 2015-11-17 ENCOUNTER — Encounter: Payer: Self-pay | Admitting: Orthopaedic Surgery

## 2015-11-17 ENCOUNTER — Encounter: Payer: Self-pay | Admitting: Orthopedic Surgery

## 2015-11-17 ENCOUNTER — Ambulatory Visit (INDEPENDENT_AMBULATORY_CARE_PROVIDER_SITE_OTHER): Payer: BLUE CROSS/BLUE SHIELD | Admitting: Orthopaedic Surgery

## 2015-11-17 ENCOUNTER — Ambulatory Visit (INDEPENDENT_AMBULATORY_CARE_PROVIDER_SITE_OTHER): Payer: BLUE CROSS/BLUE SHIELD

## 2015-11-17 VITALS — BP 150/64 | HR 61 | Temp 97.5°F | Ht 67.0 in | Wt 226.4 lb

## 2015-11-17 DIAGNOSIS — M79672 Pain in left foot: Secondary | ICD-10-CM

## 2015-11-17 DIAGNOSIS — S92342A Displaced fracture of fourth metatarsal bone, left foot, initial encounter for closed fracture: Secondary | ICD-10-CM

## 2015-11-17 MED ORDER — HYDROCODONE-ACETAMINOPHEN 5-325 MG PO TABS: 1.0000 | ORAL_TABLET | ORAL | Status: DC | PRN

## 2015-11-17 NOTE — Progress Notes (Signed)
Subjective: I hurt my foot last week.    Patient ID: Christy Lynch, female    DOB: 15-Aug-1955, 60 y.o.   MRN: [id number]  HPI  She hurt her left foot a week ago today.  She has had pain in the left foot since then.  She has had ecchymosis and swelling.  She continued working. She elevates at night and used ice.  She has more pain of the fourth and third toe base areas. She has no redness.  It has not gotten better and she wants to know what is going on.  Review of Systems  HENT: Negative for congestion.   Respiratory: Negative for cough and shortness of breath.   Cardiovascular: Negative for chest pain and leg swelling.  Endocrine: Positive for cold intolerance.  Musculoskeletal: Positive for joint swelling, arthralgias and gait problem.  Allergic/Immunologic: Positive for environmental allergies.   Past Medical History  Diagnosis Date  . Hypertension   . Other and unspecified hyperlipidemia   . Esophageal reflux   . Bursitis of hip     Past Surgical History  Procedure Laterality Date  . Abdominal hysterectomy    . Left heart cath  12/12/2006    Current Outpatient Prescriptions on File Prior to Visit  Medication Sig Dispense Refill  . allopurinol (ZYLOPRIM) 100 MG tablet Take 100 mg by mouth every evening.    Marland Kitchen atenolol (TENORMIN) 50 MG tablet Take 50 mg by mouth every evening.    . cyclobenzaprine (FLEXERIL) 10 MG tablet Take 10 mg by mouth at bedtime as needed for muscle spasms.    . DULoxetine (CYMBALTA) 60 MG capsule Take 60 mg by mouth every evening.    Marland Kitchen esomeprazole (NEXIUM) 40 MG capsule Take 40 mg by mouth daily at 12 noon.    Marland Kitchen lisinopril-hydrochlorothiazide (PRINZIDE,ZESTORETIC) 20-12.5 MG per tablet Take 1 tablet by mouth daily.    . meclizine (ANTIVERT) 25 MG tablet Take 1 tablet (25 mg total) by mouth 2 (two) times daily as needed for dizziness. 60 tablet 5  . Multiple Vitamin (MULTIVITAMIN WITH MINERALS) TABS tablet Take 1 tablet by mouth every evening.      No current facility-administered medications on file prior to visit.    Social History   Social History  . Marital Status: Married    Spouse Name: N/A  . Number of Children: N/A  . Years of Education: N/A   Occupational History  . Not on file.   Social History Main Topics  . Smoking status: Former Research scientist (life sciences)  . Smokeless tobacco: Not on file  . Alcohol Use: No  . Drug Use: No  . Sexual Activity: Not on file   Other Topics Concern  . Not on file   Social History Narrative    Family History  Problem Relation Age of Onset  . Hypertension Mother   . Stroke Mother   . Kidney disease Father     BP 150/64 mmHg  Pulse 61  Temp(Src) 97.5 F (36.4 C)  Ht 5\' 7"  (1.702 m)  Wt 226 lb 6 oz (102.683 kg)  BMI 35.45 kg/m2      Objective:   Physical Exam  Constitutional: She is oriented to person, place, and time. She appears well-developed and well-nourished.  HENT:  Head: Normocephalic and atraumatic.  Eyes: Conjunctivae and EOM are normal. Pupils are equal, round, and reactive to light.  Neck: Normal range of motion. Neck supple.  Cardiovascular: Normal rate, regular rhythm and intact distal pulses.   Pulmonary/Chest:  Effort normal.  Abdominal: Soft.  Musculoskeletal: She exhibits tenderness (She has swelling and ecchymosis of the left foot, more of the third and fourth toes.  Motion of toes tender but full.  Limp to the left.  Right foot negative.).       Feet:  Neurological: She is alert and oriented to person, place, and time. She displays normal reflexes. No cranial nerve deficit. She exhibits normal muscle tone. Coordination normal.  Skin: Skin is warm and dry.  Psychiatric: She has a normal mood and affect. Her behavior is normal. Judgment and thought content normal.    X-rays were done of the left foot, reported separately.      Assessment & Plan:   Encounter Diagnoses  Name Primary?  . Fracture of fourth metatarsal bone of left foot, closed, initial  encounter   . Left foot pain Yes   I have explained the findings of the x-ray.  She wants to continue working.  I have given Rx for post op shoe and instructions for Contrast Baths.  Return in one week with x-rays then of the left foot.  Call if any problem  Precautions discussed.  Electronically Signed Sanjuana Kava, MD 7/6/20173:32 PM

## 2015-11-17 NOTE — Patient Instructions (Signed)
Get post op shoe

## 2015-11-24 ENCOUNTER — Ambulatory Visit (INDEPENDENT_AMBULATORY_CARE_PROVIDER_SITE_OTHER): Payer: BLUE CROSS/BLUE SHIELD

## 2015-11-24 ENCOUNTER — Ambulatory Visit (INDEPENDENT_AMBULATORY_CARE_PROVIDER_SITE_OTHER): Payer: BLUE CROSS/BLUE SHIELD | Admitting: Orthopaedic Surgery

## 2015-11-24 ENCOUNTER — Encounter: Payer: Self-pay | Admitting: Orthopaedic Surgery

## 2015-11-24 VITALS — BP 127/73 | HR 63 | Temp 96.8°F | Ht 64.0 in | Wt 226.0 lb

## 2015-11-24 DIAGNOSIS — S92342D Displaced fracture of fourth metatarsal bone, left foot, subsequent encounter for fracture with routine healing: Secondary | ICD-10-CM | POA: Diagnosis not present

## 2015-11-24 DIAGNOSIS — S92335A Nondisplaced fracture of third metatarsal bone, left foot, initial encounter for closed fracture: Secondary | ICD-10-CM | POA: Diagnosis not present

## 2015-11-24 NOTE — Progress Notes (Signed)
CC:  My toes hurt  She has been using the post op shoe on the left.  She has more pain of the third and fourth toes.  NV is intact.  She has less swelling of the left foot and no redness.  Ecchymosis is resolving.  X-rays were done and reported separately.  Of note, she has a fracture of the third metatarsal not seen originally.  I have told her of this.  We will buddy tape the toes today.  Encounter Diagnoses  Name Primary?  . Closed displaced fracture of fourth metatarsal bone of left foot with routine healing, subsequent encounter Yes  . Nondisplaced fracture of third metatarsal bone, left, closed, initial encounter     Return in three weeks.  X-rays of the foot on the left on return.  Call if any problem.  Precautions discussed.  Electronically Signed Sanjuana Kava, MD 7/13/20173:21 PM

## 2015-12-15 ENCOUNTER — Ambulatory Visit: Payer: BLUE CROSS/BLUE SHIELD | Admitting: Orthopaedic Surgery

## 2015-12-20 ENCOUNTER — Encounter: Payer: Self-pay | Admitting: Orthopedic Surgery

## 2015-12-20 ENCOUNTER — Encounter: Payer: Self-pay | Admitting: Orthopaedic Surgery

## 2015-12-20 ENCOUNTER — Ambulatory Visit (INDEPENDENT_AMBULATORY_CARE_PROVIDER_SITE_OTHER): Payer: BLUE CROSS/BLUE SHIELD | Admitting: Orthopaedic Surgery

## 2015-12-20 ENCOUNTER — Ambulatory Visit (INDEPENDENT_AMBULATORY_CARE_PROVIDER_SITE_OTHER): Payer: BLUE CROSS/BLUE SHIELD

## 2015-12-20 VITALS — BP 144/82 | HR 59 | Temp 97.5°F | Ht 64.5 in | Wt 229.4 lb

## 2015-12-20 DIAGNOSIS — S92342D Displaced fracture of fourth metatarsal bone, left foot, subsequent encounter for fracture with routine healing: Secondary | ICD-10-CM | POA: Diagnosis not present

## 2015-12-20 DIAGNOSIS — S92302D Fracture of unspecified metatarsal bone(s), left foot, subsequent encounter for fracture with routine healing: Secondary | ICD-10-CM

## 2015-12-20 DIAGNOSIS — S92322A Displaced fracture of second metatarsal bone, left foot, initial encounter for closed fracture: Secondary | ICD-10-CM

## 2015-12-20 NOTE — Progress Notes (Signed)
Patient [id number] Christy Lynch, female DOB:[birth year], 60 y.o. [id number]  Chief Complaint  Patient presents with  . Follow-up    Left foot    HPI  Christy Lynch is a 60 y.o. female who has known fracture of the third and fourth metatarsals of the left foot.  She got x-rays today and a fracture of the second metatarsal nondisplaced is seen and noted.  I have explained this to her.  She is doing better.  She has no limp. She has been wearing the post op shoe. HPI  Body mass index is 38.77 kg/m.  ROS  Review of Systems  HENT: Negative for congestion.   Respiratory: Negative for cough and shortness of breath.   Cardiovascular: Negative for chest pain and leg swelling.  Endocrine: Positive for cold intolerance.  Musculoskeletal: Positive for arthralgias, gait problem and joint swelling.  Allergic/Immunologic: Positive for environmental allergies.    Past Medical History:  Diagnosis Date  . Bursitis of hip   . Esophageal reflux   . Hypertension   . Other and unspecified hyperlipidemia     Past Surgical History:  Procedure Laterality Date  . ABDOMINAL HYSTERECTOMY    . LEFT HEART CATH  12/12/2006    Family History  Problem Relation Age of Onset  . Hypertension Mother   . Stroke Mother   . Kidney disease Father     Social History Social History  Substance Use Topics  . Smoking status: Former Research scientist (life sciences)  . Smokeless tobacco: Never Used  . Alcohol use No    Allergies  Allergen Reactions  . Flagyl [Metronidazole] Nausea And Vomiting and Other (See Comments)    headache    Current Outpatient Prescriptions  Medication Sig Dispense Refill  . allopurinol (ZYLOPRIM) 100 MG tablet Take 100 mg by mouth every evening.    Marland Kitchen atenolol (TENORMIN) 50 MG tablet Take 50 mg by mouth every evening.    . cyclobenzaprine (FLEXERIL) 10 MG tablet Take 10 mg by mouth at bedtime as needed for muscle spasms.    . DULoxetine (CYMBALTA) 60 MG capsule Take 60 mg by mouth every  evening.    Marland Kitchen esomeprazole (NEXIUM) 40 MG capsule Take 40 mg by mouth daily at 12 noon.    Marland Kitchen HYDROcodone-acetaminophen (NORCO/VICODIN) 5-325 MG tablet Take 1 tablet by mouth every 4 (four) hours as needed for moderate pain (Must last 15 days.Do not take and drive a car or use machinery.). 60 tablet 0  . lisinopril-hydrochlorothiazide (PRINZIDE,ZESTORETIC) 20-12.5 MG per tablet Take 1 tablet by mouth daily.    . meclizine (ANTIVERT) 25 MG tablet Take 1 tablet (25 mg total) by mouth 2 (two) times daily as needed for dizziness. 60 tablet 5  . Multiple Vitamin (MULTIVITAMIN WITH MINERALS) TABS tablet Take 1 tablet by mouth every evening.     No current facility-administered medications for this visit.      Physical Exam  Blood pressure (!) 144/82, pulse (!) 59, temperature 97.5 F (36.4 C), height 5' 4.5" (1.638 m), weight 229 lb 6.4 oz (104.1 kg).  Constitutional: overall normal hygiene, normal nutrition, well developed, normal grooming, normal body habitus. Assistive device:post op shoe  Musculoskeletal: gait and station Limp none, muscle tone and strength are normal, no tremors or atrophy is present.  .  Neurological: coordination overall normal.  Deep tendon reflex/nerve stretch intact.  Sensation normal.  Cranial nerves II-XII intact.   Skin:   normal overall no scars, lesions, ulcers or rashes. No psoriasis.  Psychiatric: Alert and oriented  x 3.  Recent memory intact, remote memory unclear.  Normal mood and affect. Well groomed.  Good eye contact.  Cardiovascular: overall no swelling, no varicosities, no edema bilaterally, normal temperatures of the legs and arms, no clubbing, cyanosis and good capillary refill.  Lymphatic: palpation is normal.  She has just slight tenderness of the left foot distally.  NV is intact.  Gait is normal.  The patient has been educated about the nature of the problem(s) and counseled on treatment options.  The patient appeared to understand what I  have discussed and is in agreement with it.  Encounter Diagnoses  Name Primary?  . Closed displaced fracture of fourth metatarsal bone of left foot with routine healing Yes  . Fracture of second metatarsal bone of left foot, closed, initial encounter   . Fracture of third metatarsal bone, left, with routine healing, subsequent encounter     PLAN Call if any problems.  Precautions discussed.  Continue current medications.   Return to clinic 1 month   She can come out of the post op shoe as desired.  X-rays of the left foot on return.  Electronically Signed Sanjuana Kava, MD 8/8/20179:38 AM

## 2016-01-18 ENCOUNTER — Encounter: Payer: Self-pay | Admitting: Orthopedic Surgery

## 2016-01-18 ENCOUNTER — Ambulatory Visit: Payer: BLUE CROSS/BLUE SHIELD

## 2016-01-18 ENCOUNTER — Encounter: Payer: BLUE CROSS/BLUE SHIELD | Admitting: Orthopaedic Surgery

## 2016-01-31 ENCOUNTER — Ambulatory Visit (INDEPENDENT_AMBULATORY_CARE_PROVIDER_SITE_OTHER): Payer: BLUE CROSS/BLUE SHIELD | Admitting: Orthopaedic Surgery

## 2016-01-31 ENCOUNTER — Ambulatory Visit (INDEPENDENT_AMBULATORY_CARE_PROVIDER_SITE_OTHER): Payer: BLUE CROSS/BLUE SHIELD

## 2016-01-31 DIAGNOSIS — S92902D Unspecified fracture of left foot, subsequent encounter for fracture with routine healing: Secondary | ICD-10-CM

## 2016-01-31 NOTE — Progress Notes (Signed)
CC:  My foot does not hurt   She has done well with the metatarsal fractures of the left foot.  She is walking well in flip flops today.  NV intact.  Full ROM of toes present.  X-rays were taken, reported separately.  Encounter Diagnosis  Name Primary?  Marland Kitchen Foot fracture, left, with routine healing, subsequent encounter Yes   Discharge  See as needed.  Electronically Signed Sanjuana Kava, MD 9/19/201711:15 AM

## 2016-02-28 ENCOUNTER — Other Ambulatory Visit: Payer: Self-pay | Admitting: Orthopaedic Surgery

## 2016-02-29 MED ORDER — HYDROCODONE-ACETAMINOPHEN 5-325 MG PO TABS: 1.0000 | ORAL_TABLET | Freq: Four times a day (QID) | ORAL | 0 refills | Status: DC | PRN

## 2016-03-02 ENCOUNTER — Other Ambulatory Visit: Payer: Self-pay | Admitting: Family Medicine

## 2016-03-02 DIAGNOSIS — R1011 Right upper quadrant pain: Secondary | ICD-10-CM

## 2016-03-13 ENCOUNTER — Ambulatory Visit
Admission: RE | Admit: 2016-03-13 | Discharge: 2016-03-13 | Disposition: A | Discharge status: Home / Self Care | Payer: BLUE CROSS/BLUE SHIELD | Source: Ambulatory Visit | Attending: Family Medicine | Admitting: Family Medicine

## 2016-03-13 DIAGNOSIS — R1011 Right upper quadrant pain: Secondary | ICD-10-CM

## 2016-04-12 NOTE — Progress Notes (Signed)
This encounter was created in error - please disregard.

## 2016-04-24 ENCOUNTER — Other Ambulatory Visit: Payer: Self-pay | Admitting: Family Medicine

## 2016-05-10 ENCOUNTER — Other Ambulatory Visit (HOSPITAL_COMMUNITY): Payer: Self-pay | Admitting: Family Medicine

## 2016-05-10 DIAGNOSIS — R1011 Right upper quadrant pain: Secondary | ICD-10-CM

## 2016-09-26 ENCOUNTER — Ambulatory Visit (INDEPENDENT_AMBULATORY_CARE_PROVIDER_SITE_OTHER): Payer: BLUE CROSS/BLUE SHIELD

## 2016-09-26 ENCOUNTER — Encounter: Payer: Self-pay | Admitting: Orthopaedic Surgery

## 2016-09-26 ENCOUNTER — Ambulatory Visit (INDEPENDENT_AMBULATORY_CARE_PROVIDER_SITE_OTHER): Payer: BLUE CROSS/BLUE SHIELD | Admitting: Orthopaedic Surgery

## 2016-09-26 VITALS — BP 122/71 | HR 63 | Temp 97.9°F | Ht 64.5 in | Wt 224.0 lb

## 2016-09-26 DIAGNOSIS — M79672 Pain in left foot: Secondary | ICD-10-CM | POA: Diagnosis not present

## 2016-09-26 NOTE — Progress Notes (Signed)
[name], female DOB:11-Jan-1956, 60 y.o. [id number]  Chief Complaint  Patient presents with  . Follow-up    Left foot    HPI  Christy Lynch is a 61 y.o. female who had fractures of the 2, 3 4th metatarsals on the left foot last summer.  She has done well until recently and has had some pain in the left foot.  She is working 12 hours shifts now.  She has no new trauma, no redness, no swelling. She has no change in shoe wear.  She has soaked her foot and used heat. HPI  Body mass index is 37.86 kg/m.  ROS  Review of Systems  HENT: Negative for congestion.   Respiratory: Negative for cough and shortness of breath.   Cardiovascular: Negative for chest pain and leg swelling.  Endocrine: Positive for cold intolerance.  Musculoskeletal: Positive for arthralgias, gait problem and joint swelling.  Allergic/Immunologic: Positive for environmental allergies.    Past Medical History:  Diagnosis Date  . Bursitis of hip   . Esophageal reflux   . Hypertension   . Other and unspecified hyperlipidemia     Past Surgical History:  Procedure Laterality Date  . ABDOMINAL HYSTERECTOMY    . LEFT HEART CATH  12/12/2006    Family History  Problem Relation Age of Onset  . Hypertension Mother   . Stroke Mother   . Kidney disease Father     Social History Social History  Substance Use Topics  . Smoking status: Former Research scientist (life sciences)  . Smokeless tobacco: Never Used  . Alcohol use No    Allergies  Allergen Reactions  . Flagyl [Metronidazole] Nausea And Vomiting and Other (See Comments)    headache    Current Outpatient Prescriptions  Medication Sig Dispense Refill  . allopurinol (ZYLOPRIM) 100 MG tablet Take 100 mg by mouth every evening.    Marland Kitchen atenolol (TENORMIN) 50 MG tablet Take 50 mg by mouth every evening.    . cyclobenzaprine (FLEXERIL) 10 MG tablet Take 10 mg by mouth at bedtime as needed for muscle spasms.    . DULoxetine (CYMBALTA) 60 MG capsule  Take 60 mg by mouth every evening.    Marland Kitchen esomeprazole (NEXIUM) 40 MG capsule Take 40 mg by mouth daily at 12 noon.    Marland Kitchen HYDROcodone-acetaminophen (NORCO/VICODIN) 5-325 MG tablet Take 1 tablet by mouth every 6 (six) hours as needed for moderate pain (Must last 15 days.Do not take and drive a car or use machinery.). 50 tablet 0  . lisinopril-hydrochlorothiazide (PRINZIDE,ZESTORETIC) 20-12.5 MG per tablet Take 1 tablet by mouth daily.    . meclizine (ANTIVERT) 25 MG tablet Take 1 tablet (25 mg total) by mouth 2 (two) times daily as needed for dizziness. 60 tablet 5  . Multiple Vitamin (MULTIVITAMIN WITH MINERALS) TABS tablet Take 1 tablet by mouth every evening.     No current facility-administered medications for this visit.      Physical Exam  Blood pressure 122/71, pulse 63, temperature 97.9 F (36.6 C), height 5' 4.5" (1.638 m), weight 224 lb (101.6 kg).  Constitutional: overall normal hygiene, normal nutrition, well developed, normal grooming, normal body habitus. Assistive device:none  Musculoskeletal: gait and station Limp none, muscle tone and strength are normal, no tremors or atrophy is present.  .  Neurological: coordination overall normal.  Deep tendon reflex/nerve stretch intact.  Sensation normal.  Cranial nerves II-XII intact.   Skin:   Normal overall no scars, lesions, ulcers or rashes. No psoriasis.  Psychiatric:  Alert and oriented x 3.  Recent memory intact, remote memory unclear.  Normal mood and affect. Well groomed.  Good eye contact.  Cardiovascular: overall no swelling, no varicosities, no edema bilaterally, normal temperatures of the legs and arms, no clubbing, cyanosis and good capillary refill.  Lymphatic: palpation is normal.  Her left foot has no swelling, no redness.  NV intact.  Gait is normal. She has no deformity.  Exam negative.  The patient has been educated about the nature of the problem(s) and counseled on treatment options.  The patient appeared to  understand what I have discussed and is in agreement with it.  Encounter Diagnosis  Name Primary?  . Left foot pain Yes   X-rays were done of the left foot, reported separately.  PLAN Call if any problems.  Precautions discussed.  Continue current medications.   Return to clinic prn   I have suggested shoe inserts, Aspercreme.  Electronically Signed Sanjuana Kava, MD 5/16/201810:37 AM

## 2017-06-22 ENCOUNTER — Encounter (HOSPITAL_COMMUNITY): Payer: Self-pay | Admitting: *Deleted

## 2017-06-22 ENCOUNTER — Other Ambulatory Visit: Payer: Self-pay

## 2017-06-22 ENCOUNTER — Emergency Department (HOSPITAL_COMMUNITY)
Admission: EM | Admit: 2017-06-22 | Discharge: 2017-06-22 | Disposition: A | Discharge status: Home / Self Care | Payer: No Typology Code available for payment source | Attending: Emergency Medicine | Admitting: Emergency Medicine

## 2017-06-22 ENCOUNTER — Emergency Department (HOSPITAL_COMMUNITY): Payer: No Typology Code available for payment source

## 2017-06-22 DIAGNOSIS — E785 Hyperlipidemia, unspecified: Secondary | ICD-10-CM | POA: Insufficient documentation

## 2017-06-22 DIAGNOSIS — M7989 Other specified soft tissue disorders: Secondary | ICD-10-CM

## 2017-06-22 DIAGNOSIS — I10 Essential (primary) hypertension: Secondary | ICD-10-CM | POA: Insufficient documentation

## 2017-06-22 DIAGNOSIS — Z79899 Other long term (current) drug therapy: Secondary | ICD-10-CM | POA: Insufficient documentation

## 2017-06-22 DIAGNOSIS — M25571 Pain in right ankle and joints of right foot: Secondary | ICD-10-CM | POA: Diagnosis not present

## 2017-06-22 DIAGNOSIS — Z87891 Personal history of nicotine dependence: Secondary | ICD-10-CM | POA: Insufficient documentation

## 2017-06-22 MED ORDER — ENOXAPARIN SODIUM 40 MG/0.4ML ~~LOC~~ SOLN: 1.0000 mg/kg | Freq: Once | SUBCUTANEOUS | 0 refills | Status: DC

## 2017-06-22 MED ORDER — ENOXAPARIN SODIUM 100 MG/ML ~~LOC~~ SOLN
100.0000 mg | Freq: Once | SUBCUTANEOUS | Status: AC
Start: 2017-06-22 — End: 2017-06-22
  Administered 2017-06-22: 100 mg via SUBCUTANEOUS
  Filled 2017-06-22: qty 1

## 2017-06-22 NOTE — ED Notes (Signed)
Pharmacy confirmed they are sending lovenox.

## 2017-06-22 NOTE — ED Notes (Signed)
Pharmacy messaged about verifying/sending lovenox.

## 2017-06-22 NOTE — Discharge Instructions (Addendum)
As discussed, you will need to return in the morning for your ultrasound. You received a dose of anticoagulant that will last for the next 12 hours.  Return sooner if symptoms worsen, chest pain ,shortness of breath, or new concerning symptoms in the meantime.

## 2017-06-22 NOTE — ED Notes (Signed)
ED Provider at bedside. 

## 2017-06-22 NOTE — ED Triage Notes (Signed)
The pt is c/o rt foot and ankle swelling for one week  No known injury

## 2017-06-22 NOTE — ED Provider Notes (Signed)
[facility] Provider Note   CSN: [id number] Arrival date & time: 06/22/17  1925     History   Chief Complaint Chief Complaint  Patient presents with  . Ankle Pain    HPI Christy Lynch is a 62 y.o. female presenting with urgent care with right ankle pain, unilateral edema and calf pain of progressive onset over the last week. Denies any trauma or injury. No fever, chills, erythema, warmth.  Denies any chest pain or shortness of breath.  Patient does report a [address] twice a week. Denies any history of DVT/PE, prolonged immobilization, recent surgery, cough, hemoptysis.   HPI  Past Medical History:  Diagnosis Date  . Bursitis of hip   . Esophageal reflux   . Hypertension   . Other and unspecified hyperlipidemia     Patient Active Problem List   Diagnosis Date Noted  . Vertigo 08/21/2013  . Chest pain 07/02/2013  . Essential hypertension 07/02/2013  . Hyperlipidemia 07/02/2013    Past Surgical History:  Procedure Laterality Date  . ABDOMINAL HYSTERECTOMY    . LEFT HEART CATH  12/12/2006    OB History    No data available       Home Medications    Prior to Admission medications   Medication Sig Start Date End Date Taking? Authorizing Provider  allopurinol (ZYLOPRIM) 100 MG tablet Take 100 mg by mouth every evening.    [provider]  atenolol (TENORMIN) 50 MG tablet Take 50 mg by mouth every evening.    [provider]  cyclobenzaprine (FLEXERIL) 10 MG tablet Take 10 mg by mouth at bedtime as needed for muscle spasms.    [provider]  DULoxetine (CYMBALTA) 60 MG capsule Take 60 mg by mouth every evening.    [provider]  esomeprazole (NEXIUM) 40 MG capsule Take 40 mg by mouth daily at 12 noon.    [provider]  HYDROcodone-acetaminophen (NORCO/VICODIN) 5-325 MG tablet Take 1 tablet by mouth every 6 (six) hours as needed for moderate pain (Must last 15  days.Do not take and drive a car or use machinery.). 02/29/16   Sanjuana Kava, MD  lisinopril-hydrochlorothiazide (PRINZIDE,ZESTORETIC) 20-12.5 MG per tablet Take 1 tablet by mouth daily.    [provider]  meclizine (ANTIVERT) 25 MG tablet Take 1 tablet (25 mg total) by mouth 2 (two) times daily as needed for dizziness. 08/21/13   Dorothy Spark, MD  Multiple Vitamin (MULTIVITAMIN WITH MINERALS) TABS tablet Take 1 tablet by mouth every evening.    [provider]    Family History Family History  Problem Relation Age of Onset  . Hypertension Mother   . Stroke Mother   . Kidney disease Father     Social History Social History   Tobacco Use  . Smoking status: Former Research scientist (life sciences)  . Smokeless tobacco: Never Used  Substance Use Topics  . Alcohol use: No  . Drug use: No     Allergies   Flagyl [metronidazole]   Review of Systems Review of Systems  Constitutional: Negative for chills, diaphoresis, fatigue and fever.  Respiratory: Negative for cough, choking, chest tightness, shortness of breath, wheezing and stridor.   Cardiovascular: Positive for leg swelling. Negative for chest pain and palpitations.  Gastrointestinal: Negative for nausea and vomiting.  Musculoskeletal: Positive for arthralgias and joint swelling. Negative for gait problem, neck pain and neck stiffness.  Skin: Negative for color change, pallor, rash and wound.  Neurological: Negative  for dizziness, weakness, light-headedness, numbness and headaches.  Psychiatric/Behavioral: Negative for behavioral problems.     Physical Exam Updated Vital Signs BP 129/70 (BP Location: Right Arm)   Pulse (!) 56   Temp 97.8 F (36.6 C) (Oral)   Resp 15   Ht 5\' 7"  (1.702 m)   Wt 103 kg (227 lb)   SpO2 100%   BMI 35.55 kg/m   Physical Exam  Constitutional: She appears well-developed and well-nourished. No distress.  Well-appearing, nontoxic afebrile sitting comfortably in bed no acute distress.    HENT:  Head: Normocephalic and atraumatic.  Cardiovascular: Normal rate, regular rhythm, normal heart sounds and intact distal pulses.  No murmur heard. Pulmonary/Chest: Effort normal and breath sounds normal. No stridor. No respiratory distress. She has no wheezes. She has no rales.  Musculoskeletal: Normal range of motion. She exhibits edema and tenderness.  Strong dorsalis pedis pulses, edema to the right ankle and calf.  Tender to palpation of the calf worse medially.  No erythema or warmth.  Neurological: She is alert. No sensory deficit.  Skin: Skin is warm and dry. Capillary refill takes less than 2 seconds. No rash noted. She is not diaphoretic. No erythema. No pallor.  Psychiatric: She has a normal mood and affect.  Nursing note and vitals reviewed.    ED Treatments / Results  Labs (all labs ordered are listed, but only abnormal results are displayed) Labs Reviewed - No data to display  EKG  EKG Interpretation None       Radiology Dg Ankle Complete Right  Result Date: 06/22/2017 CLINICAL DATA:  Pt reports she woke up x 1 week ago with her right ankle and right foot swollen. Denies any known injury or fall. States most of her pain and swelling is located on the medial side of right ankle. Pt is able to bear some weight on right foot and ankle. EXAM: RIGHT ANKLE - COMPLETE 3+ VIEW COMPARISON:  None. FINDINGS: No fracture.  No bone lesion. The ankle joint is normally spaced and aligned. There are small marginal osteophytes from the articular surface of the distal tibia. No other arthropathic change. Small plantar calcaneal spur. There are areas of ossification along the lower Achilles tendon. There is diffuse soft tissue swelling, more evident laterally. IMPRESSION: 1. No fracture or bone lesion. 2. Mild marginal spurring from the distal tibia at the tibiotalar joint. No other ankle arthropathic change. 3. Nonspecific soft tissue swelling. Electronically Signed   By: Lajean Manes  M.D.   On: 06/22/2017 20:04   Dg Foot 2 Views Right  Result Date: 06/22/2017 CLINICAL DATA:  Pt reports she woke up x 1 week ago with her right ankle and right foot swollen. Denies any known injury or fall. States most of her pain and swelling is located on the medial side of right ankle. Pt is able to bear some weight on right foot and ankle. EXAM: RIGHT FOOT - 2 VIEW COMPARISON:  None. FINDINGS: No fracture or bone lesion. The joints are normally spaced and aligned. There is a flattened plantar arch. Small plantar calcaneal spur. There is ossification along the course of the Achilles tendon. Soft tissues are otherwise unremarkable. IMPRESSION: 1. No fracture or joint abnormality. 2. Small plantar calcaneal spur.  Flattened plantar arch. Electronically Signed   By: Lajean Manes M.D.   On: 06/22/2017 20:02    Procedures Procedures (including critical care time)  Medications Ordered in ED Medications  enoxaparin (LOVENOX) injection 105 mg (not administered)  Initial Impression / Assessment and Plan / ED Course  I have reviewed the triage vital signs and the nursing notes.  Pertinent labs & imaging results that were available during my care of the patient were reviewed by me and considered in my medical decision making (see chart for details).    Patient presenting with progressive onset worsening right lower extremity edema and pain.  Was seen at urgent care and sent to the emergency department to rule out DVT. Edema and tenderness palpation.  No evidence of cellulitis.  Otherwise well-appearing nontoxic afebrile. Films negative  No Ultrasound available at this time of night.  She was given Lovenox subcutanous in the emergency department and scheduled return for DVT study in the morning.  disharge home with return in the morning.  Discussed return precautions and advised to return if worsening in the meantime.  She understood and agrees with discharge plan.  Final Clinical  Impressions(s) / ED Diagnoses   Final diagnoses:  Acute right ankle pain  Leg swelling    ED Discharge Orders        Ordered    LE VENOUS     06/22/17 2257    enoxaparin (LOVENOX) 40 MG/0.4ML injection   Once,   Status:  Discontinued     06/22/17 2303       Emeline General, PA-C 06/22/17 2319    Quintella Reichert, MD 06/23/17 [phone]

## 2017-06-23 ENCOUNTER — Ambulatory Visit (HOSPITAL_COMMUNITY)
Admission: RE | Admit: 2017-06-23 | Discharge: 2017-06-23 | Disposition: A | Discharge status: Home / Self Care | Payer: No Typology Code available for payment source | Source: Ambulatory Visit | Attending: Emergency Medicine | Admitting: Emergency Medicine

## 2017-06-23 DIAGNOSIS — M79604 Pain in right leg: Secondary | ICD-10-CM | POA: Diagnosis not present

## 2017-06-23 DIAGNOSIS — R600 Localized edema: Secondary | ICD-10-CM | POA: Diagnosis not present

## 2017-06-23 DIAGNOSIS — M7989 Other specified soft tissue disorders: Secondary | ICD-10-CM | POA: Diagnosis not present

## 2017-06-23 NOTE — Progress Notes (Signed)
RLE venous duplex prelim: negative for DVT.  Shylyn Younce Eunice, RDMS, RVT  

## 2017-06-26 ENCOUNTER — Encounter: Payer: Self-pay | Admitting: Orthopaedic Surgery

## 2017-06-26 ENCOUNTER — Ambulatory Visit (INDEPENDENT_AMBULATORY_CARE_PROVIDER_SITE_OTHER): Payer: PRIVATE HEALTH INSURANCE | Admitting: Orthopaedic Surgery

## 2017-06-26 VITALS — BP 126/61 | HR 54 | Temp 97.4°F | Ht 64.5 in | Wt 229.0 lb

## 2017-06-26 DIAGNOSIS — M25571 Pain in right ankle and joints of right foot: Secondary | ICD-10-CM

## 2017-06-26 DIAGNOSIS — M109 Gout, unspecified: Secondary | ICD-10-CM

## 2017-06-26 LAB — URIC ACID: Uric Acid, Serum: 5 mg/dL (ref 2.5–7.0)

## 2017-06-26 MED ORDER — PREDNISONE 5 MG (21) PO TBPK: ORAL_TABLET | ORAL | 0 refills | Status: DC

## 2017-06-26 MED ORDER — HYDROCODONE-ACETAMINOPHEN 5-325 MG PO TABS: ORAL_TABLET | ORAL | 0 refills | Status: DC

## 2017-06-26 NOTE — Progress Notes (Signed)
[name], female DOB:Apr 06, 1956, 62 y.o. [id number]  Chief Complaint  Patient presents with  . Foot Pain    right     HPI  [name] is a 62 y.o. female who has had right foot and ankle pain.over the last two weeks.  She was seen at [facility] ER.  X-rays were negative.  She was tested for DVT and it was negative.  She has no trauma, no redness.  She awoke and her foot was tender and swollen.  She has tried ice, heat, Tylenol, elevation with no help.  It is not getting better.  I have reviewed the x-rays, the reports of the x-rays and the ER visit notes. HPI  Body mass index is 38.7 kg/m.  ROS  Review of Systems  HENT: Negative for congestion.   Respiratory: Negative for cough and shortness of breath.   Cardiovascular: Negative for chest pain and leg swelling.  Endocrine: Positive for cold intolerance.  Musculoskeletal: Positive for arthralgias, gait problem and joint swelling.  Allergic/Immunologic: Positive for environmental allergies.    Past Medical History:  Diagnosis Date  . Bursitis of hip   . Esophageal reflux   . Hypertension   . Other and unspecified hyperlipidemia     Past Surgical History:  Procedure Laterality Date  . ABDOMINAL HYSTERECTOMY    . LEFT HEART CATH  12/12/2006    Family History  Problem Relation Age of Onset  . Hypertension Mother   . Stroke Mother   . Kidney disease Father     Social History Social History   Tobacco Use  . Smoking status: Former Research scientist (life sciences)  . Smokeless tobacco: Never Used  Substance Use Topics  . Alcohol use: No  . Drug use: No    Allergies  Allergen Reactions  . Flagyl [Metronidazole] Nausea And Vomiting and Other (See Comments)    headache    Current Outpatient Medications  Medication Sig Dispense Refill  . allopurinol (ZYLOPRIM) 100 MG tablet Take 100 mg by mouth every evening.    Marland Kitchen atenolol (TENORMIN) 50 MG tablet Take 50 mg by mouth every evening.    . cyclobenzaprine  (FLEXERIL) 10 MG tablet Take 10 mg by mouth at bedtime as needed for muscle spasms.    . DULoxetine (CYMBALTA) 60 MG capsule Take 60 mg by mouth every evening.    Marland Kitchen esomeprazole (NEXIUM) 40 MG capsule Take 40 mg by mouth daily at 12 noon.    Marland Kitchen lisinopril-hydrochlorothiazide (PRINZIDE,ZESTORETIC) 20-12.5 MG per tablet Take 1 tablet by mouth daily.    . meclizine (ANTIVERT) 25 MG tablet Take 1 tablet (25 mg total) by mouth 2 (two) times daily as needed for dizziness. 60 tablet 5  . Multiple Vitamin (MULTIVITAMIN WITH MINERALS) TABS tablet Take 1 tablet by mouth every evening.    Marland Kitchen HYDROcodone-acetaminophen (NORCO/VICODIN) 5-325 MG tablet One tablet every four hours as needed for acute pain.  Limit of five days per [place] statue. 30 tablet 0  . predniSONE (STERAPRED UNI-PAK 21 TAB) 5 MG (21) TBPK tablet Take 6 pills first day; 5 pills second day; 4 pills third day; 3 pills fourth day; 2 pills next day and 1 pill last day. 21 tablet 0   No current facility-administered medications for this visit.      Physical Exam  Blood pressure 126/61, pulse (!) 54, temperature (!) 97.4 F (36.3 C), height 5' 4.5" (1.638 m), weight 229 lb (103.9 kg).  Constitutional: overall normal hygiene, normal nutrition, well developed, normal  grooming, normal body habitus. Assistive device:none  Musculoskeletal: gait and station Limp right, muscle tone and strength are normal, no tremors or atrophy is present.  .  Neurological: coordination overall normal.  Deep tendon reflex/nerve stretch intact.  Sensation normal.  Cranial nerves II-XII intact.   Skin:   Normal overall no scars, lesions, ulcers or rashes. No psoriasis.  Psychiatric: Alert and oriented x 3.  Recent memory intact, remote memory unclear.  Normal mood and affect. Well groomed.  Good eye contact.  Cardiovascular: overall no swelling, no varicosities, no edema bilaterally, normal temperatures of the legs and arms, no clubbing, cyanosis and good  capillary refill.  Lymphatic: palpation is normal.  Right foot is diffusely tender, no redness, no fluctuance, limp right.  NV intact.  All other systems reviewed and are negative   The patient has been educated about the nature of the problem(s) and counseled on treatment options.  The patient appeared to understand what I have discussed and is in agreement with it.  Encounter Diagnoses  Name Primary?  . Pain in joint involving right ankle and foot Yes  . Acute gout of right foot, unspecified cause     PLAN Call if any problems.  Precautions discussed.  Begin Prednisone and Norco.   Return to clinic 1 week   She is to get serum uric acid drawn.  I have reviewed the [facility] web site prior to prescribing narcotic medicine for this patient.  Electronically Signed Sanjuana Kava, MD 2/13/20198:24 AM

## 2017-07-02 ENCOUNTER — Encounter: Payer: Self-pay | Admitting: Orthopaedic Surgery

## 2017-07-02 ENCOUNTER — Ambulatory Visit (INDEPENDENT_AMBULATORY_CARE_PROVIDER_SITE_OTHER): Payer: PRIVATE HEALTH INSURANCE | Admitting: Orthopaedic Surgery

## 2017-07-02 VITALS — BP 149/81 | HR 63 | Temp 98.1°F | Ht 64.5 in | Wt 224.0 lb

## 2017-07-02 DIAGNOSIS — M25571 Pain in right ankle and joints of right foot: Secondary | ICD-10-CM

## 2017-07-02 MED ORDER — HYDROCODONE-ACETAMINOPHEN 5-325 MG PO TABS: 1.0000 | ORAL_TABLET | Freq: Four times a day (QID) | ORAL | 0 refills | Status: DC | PRN

## 2017-07-02 NOTE — Patient Instructions (Signed)
Out of work until Monday February 25, return February 25.

## 2017-07-02 NOTE — Progress Notes (Signed)
Patient SW:FUXNATFT Christy Lynch, female DOB:03-16-1956, 62 y.o. [id number]  Chief Complaint  Patient presents with  . Foot Pain    right     HPI  Christy Lynch is a 62 y.o. female who has right foot pain.  Her pain is less.  She has less swelling.  Uric acid was normal and she does not have gout.  She has been using the contrast baths and elevation.  She has no new trauma.  She still has problems walking distances.  She is taking her pain medicine. HPI  Body mass index is 37.86 kg/m.  ROS  Review of Systems  HENT: Negative for congestion.   Respiratory: Negative for cough and shortness of breath.   Cardiovascular: Negative for chest pain and leg swelling.  Endocrine: Positive for cold intolerance.  Musculoskeletal: Positive for arthralgias, gait problem and joint swelling.  Allergic/Immunologic: Positive for environmental allergies.  All other systems reviewed and are negative.   Past Medical History:  Diagnosis Date  . Bursitis of hip   . Esophageal reflux   . Hypertension   . Other and unspecified hyperlipidemia     Past Surgical History:  Procedure Laterality Date  . ABDOMINAL HYSTERECTOMY    . LEFT HEART CATH  12/12/2006    Family History  Problem Relation Age of Onset  . Hypertension Mother   . Stroke Mother   . Kidney disease Father     Social History Social History   Tobacco Use  . Smoking status: Former Research scientist (life sciences)  . Smokeless tobacco: Never Used  Substance Use Topics  . Alcohol use: No  . Drug use: No    Allergies  Allergen Reactions  . Flagyl [Metronidazole] Nausea And Vomiting and Other (See Comments)    headache    Current Outpatient Medications  Medication Sig Dispense Refill  . allopurinol (ZYLOPRIM) 100 MG tablet Take 100 mg by mouth every evening.    Marland Kitchen atenolol (TENORMIN) 50 MG tablet Take 50 mg by mouth every evening.    . cyclobenzaprine (FLEXERIL) 10 MG tablet Take 10 mg by mouth at bedtime as needed for muscle spasms.     . DULoxetine (CYMBALTA) 60 MG capsule Take 60 mg by mouth every evening.    Marland Kitchen esomeprazole (NEXIUM) 40 MG capsule Take 40 mg by mouth daily at 12 noon.    Marland Kitchen lisinopril-hydrochlorothiazide (PRINZIDE,ZESTORETIC) 20-12.5 MG per tablet Take 1 tablet by mouth daily.    . meclizine (ANTIVERT) 25 MG tablet Take 1 tablet (25 mg total) by mouth 2 (two) times daily as needed for dizziness. 60 tablet 5  . Multiple Vitamin (MULTIVITAMIN WITH MINERALS) TABS tablet Take 1 tablet by mouth every evening.    Marland Kitchen HYDROcodone-acetaminophen (NORCO/VICODIN) 5-325 MG tablet Take 1 tablet by mouth every 6 (six) hours as needed for moderate pain. 56 tablet 0   No current facility-administered medications for this visit.      Physical Exam  Blood pressure (!) 149/81, pulse 63, temperature 98.1 F (36.7 C), height 5' 4.5" (1.638 m), weight 224 lb (101.6 kg).  Constitutional: overall normal hygiene, normal nutrition, well developed, normal grooming, normal body habitus. Assistive device:cane  Musculoskeletal: gait and station Limp right, muscle tone and strength are normal, no tremors or atrophy is present.  .  Neurological: coordination overall normal.  Deep tendon reflex/nerve stretch intact.  Sensation normal.  Cranial nerves II-XII intact.   Skin:   Normal overall no scars, lesions, ulcers or rashes. No psoriasis.  Psychiatric: Alert and oriented x  3.  Recent memory intact, remote memory unclear.  Normal mood and affect. Well groomed.  Good eye contact.  Cardiovascular: overall no swelling, no varicosities, no edema bilaterally, normal temperatures of the legs and arms, no clubbing, cyanosis and good capillary refill.  Lymphatic: palpation is normal.  Right foot with slight swelling, more dorsally.  NV intact.  Limp right.  ROM of the ankle is full.  All other systems reviewed and are negative   The patient has been educated about the nature of the problem(s) and counseled on treatment options.  The  patient appeared to understand what I have discussed and is in agreement with it.  Encounter Diagnosis  Name Primary?  . Pain in joint involving right ankle and foot Yes    PLAN Call if any problems.  Precautions discussed.  Continue current medications.   Return to clinic 3 weeks   I have reviewed the [place] web site prior to prescribing narcotic medicine for this patient.  Electronically Signed Sanjuana Kava, MD 2/19/20199:54 AM

## 2017-07-04 ENCOUNTER — Telehealth: Payer: Self-pay | Admitting: Orthopaedic Surgery

## 2017-07-04 MED ORDER — HYDROCODONE-ACETAMINOPHEN 5-325 MG PO TABS: ORAL_TABLET | ORAL | 0 refills | Status: DC

## 2017-07-04 NOTE — Telephone Encounter (Signed)
Hydrocodone -Acetaminophen  5/325 mg for 1 week supply   PATIENT USES [facility]

## 2017-07-10 NOTE — Telephone Encounter (Signed)
I called and left a message for this patient to call me regarding the pain medication.  Dr. Luna Glasgow said to tell her to use Advil and tylenol alternately every 2 hours.  Her insurance denied the 7 day prescription without authorization.  Dr. Luna Glasgow will not do the authorization.  If she continues to receive the pain medication, she will need to get it from her family doctor.

## 2017-07-26 ENCOUNTER — Ambulatory Visit (INDEPENDENT_AMBULATORY_CARE_PROVIDER_SITE_OTHER): Payer: PRIVATE HEALTH INSURANCE | Admitting: Orthopedic Surgery

## 2017-07-26 ENCOUNTER — Encounter: Payer: Self-pay | Admitting: Orthopedic Surgery

## 2017-07-26 VITALS — BP 137/86 | HR 57 | Ht 64.5 in | Wt 227.0 lb

## 2017-07-26 DIAGNOSIS — M76822 Posterior tibial tendinitis, left leg: Secondary | ICD-10-CM | POA: Diagnosis not present

## 2017-07-26 DIAGNOSIS — M76821 Posterior tibial tendinitis, right leg: Secondary | ICD-10-CM

## 2017-07-26 NOTE — Progress Notes (Signed)
Progress Note   Patient ID: Christy Lynch, female   DOB: 06-10-1955, 62 y.o.   MRN: [id number]  Chief Complaint  Patient presents with  . Foot Pain    right foot     62 year old female currently still employed presents for evaluation of ongoing pain in her right foot from posterior tibial tendon dysfunction she did improve with steroids but still has 5 out of 10 pain she would like her pain medication refilled  She has tried tried foot orthosis in the past has seen a foot and ankle specialist as well the inserts give her calluses.  She was also casted on the right at one point.     Review of Systems  Constitutional: Negative for fever.  Skin: Negative.   Neurological: Negative for tingling.   Current Meds  Medication Sig  . allopurinol (ZYLOPRIM) 100 MG tablet Take 100 mg by mouth every evening.  Marland Kitchen atenolol (TENORMIN) 50 MG tablet Take 50 mg by mouth every evening.  Marland Kitchen esomeprazole (NEXIUM) 40 MG capsule Take 40 mg by mouth daily at 12 noon.  Marland Kitchen HYDROcodone-acetaminophen (NORCO/VICODIN) 5-325 MG tablet One tablet by mouth every six hours as needed for pain.  Seven day limit  . lisinopril-hydrochlorothiazide (PRINZIDE,ZESTORETIC) 20-12.5 MG per tablet Take 1 tablet by mouth daily.  . meclizine (ANTIVERT) 25 MG tablet Take 1 tablet (25 mg total) by mouth 2 (two) times daily as needed for dizziness.  . Multiple Vitamin (MULTIVITAMIN WITH MINERALS) TABS tablet Take 1 tablet by mouth every evening.    Allergies  Allergen Reactions  . Flagyl [Metronidazole] Nausea And Vomiting and Other (See Comments)    headache     BP 137/86   Pulse (!) 57   Ht 5' 4.5" (1.638 m)   Wt 227 lb (103 kg)   BMI 38.36 kg/m   Physical Exam  Constitutional: She is oriented to person, place, and time. She appears well-developed and well-nourished.  Neurological: She is alert and oriented to person, place, and time.  Psychiatric: She has a normal mood and affect. Judgment normal.  Vitals  reviewed.  Examination it reveals bilateral pes planus severe mild decrease in subtalar motion calcaneus is in valgus or ankle joint motion is still intact  She has tenderness over the medial and lateral areas of the ankle and subtalar joint neurovascular exam remains normal  Medical decision-making Encounter Diagnosis  Name Primary?  . Posterior tibial tendon dysfunction (PTTD) of both lower extremities Yes   Current plan is to see [employer] for PT TD bracing and continue with current pain management  See [employer] for possible bracing either AFO articulated AFO or double upright AFO   No orders of the defined types were placed in this encounter.    Arther Abbott, MD 07/26/2017 12:19 PM

## 2017-08-10 ENCOUNTER — Encounter: Payer: Self-pay | Admitting: Critical Care Medicine

## 2017-08-10 LAB — GLUCOSE, POCT (MANUAL RESULT ENTRY): POC GLUCOSE: 106 mg/dL — AB (ref 70–99)

## 2017-08-10 LAB — POCT GLYCOSYLATED HEMOGLOBIN (HGB A1C): Hemoglobin A1C: 5.8

## 2019-08-10 ENCOUNTER — Ambulatory Visit: Payer: PRIVATE HEALTH INSURANCE | Attending: Internal Medicine

## 2019-08-10 DIAGNOSIS — Z23 Encounter for immunization: Secondary | ICD-10-CM

## 2019-08-10 NOTE — Progress Notes (Signed)
   Covid-19 Vaccination Clinic  Name:  Christy Lynch    MRN: [id number] DOB: Sep 01, 1955  08/10/2019  Christy Lynch was observed post Covid-19 immunization for 15 minutes without incident. She was provided with Vaccine Information Sheet and instruction to access the V-Safe system.   Christy Lynch was instructed to call 911 with any severe reactions post vaccine: Marland Kitchen Difficulty breathing  . Swelling of face and throat  . A fast heartbeat  . A bad rash all over body  . Dizziness and weakness   Immunizations Administered    Name Date Dose VIS Date Route   Pfizer COVID-19 Vaccine 08/10/2019  2:50 PM 0.3 mL 04/24/2019 Intramuscular   Manufacturer: [place]   Lot: [id number]   [place]: [id number]

## 2019-08-31 ENCOUNTER — Ambulatory Visit: Payer: PRIVATE HEALTH INSURANCE | Attending: Internal Medicine

## 2019-08-31 DIAGNOSIS — Z23 Encounter for immunization: Secondary | ICD-10-CM

## 2019-08-31 NOTE — Progress Notes (Signed)
   Covid-19 Vaccination Clinic  Name:  ROSALEY ZAHEER    MRN: [id number] DOB: 19-Nov-1955  08/31/2019  Ms. Casino was observed post Covid-19 immunization for 15 minutes without incident. She was provided with Vaccine Information Sheet and instruction to access the V-Safe system.   Ms. Serlin was instructed to call 911 with any severe reactions post vaccine: Marland Kitchen Difficulty breathing  . Swelling of face and throat  . A fast heartbeat  . A bad rash all over body  . Dizziness and weakness   Immunizations Administered    Name Date Dose VIS Date Route   Pfizer COVID-19 Vaccine 08/31/2019  2:35 PM 0.3 mL 07/08/2018 Intramuscular   Manufacturer: [employer], [employer]   Lot: [id number]   [place]: [id number]

## 2019-10-14 ENCOUNTER — Encounter: Payer: Self-pay | Admitting: Orthopaedic Surgery

## 2019-10-14 ENCOUNTER — Ambulatory Visit (INDEPENDENT_AMBULATORY_CARE_PROVIDER_SITE_OTHER): Payer: BC Managed Care – PPO | Admitting: Orthopaedic Surgery

## 2019-10-14 ENCOUNTER — Ambulatory Visit: Payer: Self-pay

## 2019-10-14 ENCOUNTER — Other Ambulatory Visit: Payer: Self-pay

## 2019-10-14 VITALS — Ht 67.0 in | Wt 239.0 lb

## 2019-10-14 DIAGNOSIS — M25562 Pain in left knee: Secondary | ICD-10-CM

## 2019-10-14 DIAGNOSIS — G8929 Other chronic pain: Secondary | ICD-10-CM

## 2019-10-14 DIAGNOSIS — M1712 Unilateral primary osteoarthritis, left knee: Secondary | ICD-10-CM | POA: Insufficient documentation

## 2019-10-14 NOTE — Progress Notes (Signed)
Office Visit Note   Patient: Christy Lynch           Date of Birth: Sep 23, 1955           MRN: [id number] Visit Date: 10/14/2019              Requested by: Antony Blackbird, MD [place],  [place] [address] PCP: Antony Blackbird, MD   Assessment & Plan: Visit Diagnoses:  1. Chronic pain of left knee   2. Unilateral primary osteoarthritis, left knee     Plan: Chronic, recurrent pain left knee.  Christy Lynch had an MRI scan of her left knee in 2016 ordered by Dr. Luna [place].  This demonstrated tearing of both the medial lateral menisci as well as advanced arthritis in all 3 compartments of her knee.  Surgery was not performed but she did well with medical treatment.  Within the last 6 months she has had recurrent pain to the point of compromise.  She took a short course of prednisone per primary care physician she notes that it "made a big difference".  Hans Panwala injected her knee with cortisone about a month ago and she is not sure that it made that much of a difference I suspect x-rays were obtained at [facility] last month but were unable to retrieve them.  I suspect based on her history and MRI scan from 5 years ago that she has advanced osteoarthritis.  Long discussion over 30 minutes regarding treatment options which are somewhat limited but worth trying.  This would include weight loss and nonweightbearing exercises.  I also think it might be worth trying a course of viscosupplementation.  I am not sure she would be a good candidate for knee replacement right now as I think she is fairly deconditioned and is having some issues with her ankles.  She wears an Manufacturing systems engineer.  We will pre-CERT for Visco supplementation and have her return in 2 weeks This patient is diagnosed with osteoarthritis of the knee(s).    Radiographs show evidence of joint space narrowing, osteophytes, subchondral sclerosis and/or subchondral cysts.  This patient has knee pain which interferes  with functional and activities of daily living.    This patient has experienced inadequate response, adverse effects and/or intolerance with conservative treatments such as acetaminophen, NSAIDS, topical creams, physical therapy or regular exercise, knee bracing and/or weight loss.   This patient has experienced inadequate response or has a contraindication to intra articular steroid injections for at least 3 months.   This patient is not scheduled to have a total knee replacement within 6 months of starting treatment with viscosupplementation.  Follow-Up Instructions: Return Pre-CERT Visco supplementation.   Orders:  Orders Placed This Encounter  Procedures  . XR KNEE 3 VIEW LEFT   No orders of the defined types were placed in this encounter.     Procedures: No procedures performed   Clinical Data: No additional findings.   Subjective: Chief Complaint  Patient presents with  . Left Knee - Pain  Patient presents today for left knee pain. No known injury. Her knee has been hurting for 3 years. Her pain is located more medially and posteriorly. Her pain is constant, but seems to have been worsened with walking in a CAM boot on the right side. She said that grinds, swells, and occasionally gives way. No catches. No previous knee surgery. She takes Ibuprofen for pain. Not diabetic. She states that she received a cortisone injection with her PCP  one month ago, but did not receive any relief from it.   Has had pain off and on in her left  knee for almost 5 or 6 years.  MRI scan in 2016 was performed and outlined as above.  No surgery performed.  She also had recent x-rays performed at [facility] about a month ago.  We just received a copy of the report as were unable to retrieve the images.  There were tricompartmental degenerative changes most notably in the patellofemoral joint with extensive chondrocalcinosis. there was moderate narrowing of the patellofemoral joint and mild  narrowing medially.  I HPI  Review of Systems   Objective: Vital Signs: Ht 5\' 7"  (1.702 m)   Wt 239 lb (108.4 kg)   BMI 37.43 kg/m   Physical Exam Constitutional:      Appearance: She is well-developed.  Eyes:     Pupils: Pupils are equal, round, and reactive to light.  Pulmonary:     Effort: Pulmonary effort is normal.  Skin:    General: Skin is warm and dry.  Neurological:     Mental Status: She is alert and oriented to person, place, and time.  Psychiatric:        Behavior: Behavior normal.     Ortho Exam awake alert and oriented x3.  Comfortable sitting.  Left knee with slight increased varus.  Knee was not hot warm or red.  Predominately medial joint pain diffusely.  No pain laterally.  Considerable patellar crepitation and pain with compression.  Flexed about 100 degrees without instability.  No popliteal pain.  No calf pain.  No distal edema.  Motor exam intact.  Does have significant pronation with some discomfort along the medial aspect of her left ankle.  Right leg is in an Manufacturing systems engineer  Specialty Comments:  No specialty comments available.  Imaging: No results found.   PMFS History: Patient Active Problem List   Diagnosis Date Noted  . Unilateral primary osteoarthritis, left knee 10/14/2019  . Vertigo 08/21/2013  . Chest pain 07/02/2013  . Essential hypertension 07/02/2013  . Hyperlipidemia 07/02/2013   Past Medical History:  Diagnosis Date  . Bursitis of hip   . Esophageal reflux   . Hypertension   . Other and unspecified hyperlipidemia     Family History  Problem Relation Age of Onset  . Hypertension Mother   . Stroke Mother   . Kidney disease Father     Past Surgical History:  Procedure Laterality Date  . ABDOMINAL HYSTERECTOMY    . LEFT HEART CATH  12/12/2006   Social History   Occupational History  . Not on file  Tobacco Use  . Smoking status: Former Research scientist (life sciences)  . Smokeless tobacco: Never Used  Substance and Sexual Activity  .  Alcohol use: No  . Drug use: No  . Sexual activity: Not on file

## 2019-10-28 ENCOUNTER — Ambulatory Visit (INDEPENDENT_AMBULATORY_CARE_PROVIDER_SITE_OTHER): Payer: BC Managed Care – PPO | Admitting: Orthopaedic Surgery

## 2019-10-28 ENCOUNTER — Encounter: Payer: Self-pay | Admitting: Orthopaedic Surgery

## 2019-10-28 ENCOUNTER — Other Ambulatory Visit: Payer: Self-pay

## 2019-10-28 VITALS — Ht 67.0 in | Wt 239.0 lb

## 2019-10-28 DIAGNOSIS — M1712 Unilateral primary osteoarthritis, left knee: Secondary | ICD-10-CM | POA: Diagnosis not present

## 2019-10-28 MED ORDER — HYLAN G-F 20 16 MG/2ML IX SOSY
16.0000 mg | PREFILLED_SYRINGE | INTRA_ARTICULAR | Status: AC | PRN
  Administered 2019-10-28: 16 mg via INTRA_ARTICULAR

## 2019-10-28 NOTE — Progress Notes (Signed)
   Office Visit Note   Patient: Christy Lynch           Date of Birth: [birth year]           MRN: [id number] Visit Date: 10/28/2019              Requested by: Antony Blackbird, MD [place],  [place] [address] PCP: Antony Blackbird, MD   Assessment & Plan: Visit Diagnoses:  1. Unilateral primary osteoarthritis, left knee     Plan: First Synvisc injection left knee.  Follow-up weekly for the next 2 weeks to complete the series  Follow-Up Instructions: Return in about 1 week (around 11/04/2019).   Orders:  Orders Placed This Encounter  Procedures  . Large Joint Inj: L knee   No orders of the defined types were placed in this encounter.     Procedures: Large Joint Inj: L knee on 10/28/2019 2:45 PM Indications: pain and joint swelling Details: 25 G 1.5 in needle, anteromedial approach  Arthrogram: No  Medications: 16 mg Hylan 16 MG/2ML Outcome: tolerated well, no immediate complications Procedure, treatment alternatives, risks and benefits explained, specific risks discussed. Consent was given by the patient. Immediately prior to procedure a time out was called to verify the correct patient, procedure, equipment, support staff and site/side marked as required. Patient was prepped and draped in the usual sterile fashion.       Clinical Data: No additional findings.   Subjective: Chief Complaint  Patient presents with  . Left Knee - Follow-up  Patient presents today for follow up on her left knee. She is here today to get the first injection of the series of Synvisc in her left knee.   HPI  Review of Systems   Objective: Vital Signs: Ht 5\' 7"  (1.702 m)   Wt 239 lb (108.4 kg)   BMI 37.43 kg/m   Physical Exam  Ortho Exam left knee was not hot red warm or swollen.  No obvious effusion.  Predominately medial joint pain today.  Specialty Comments:  No specialty comments available.  Imaging: No results found.   PMFS History: Patient Active  Problem List   Diagnosis Date Noted  . Unilateral primary osteoarthritis, left knee 10/14/2019  . Vertigo 08/21/2013  . Chest pain 07/02/2013  . Essential hypertension 07/02/2013  . Hyperlipidemia 07/02/2013   Past Medical History:  Diagnosis Date  . Bursitis of hip   . Esophageal reflux   . Hypertension   . Other and unspecified hyperlipidemia     Family History  Problem Relation Age of Onset  . Hypertension Mother   . Stroke Mother   . Kidney disease Father     Past Surgical History:  Procedure Laterality Date  . ABDOMINAL HYSTERECTOMY    . LEFT HEART CATH  12/12/2006   Social History   Occupational History  . Not on file  Tobacco Use  . Smoking status: Former Research scientist (life sciences)  . Smokeless tobacco: Never Used  Substance and Sexual Activity  . Alcohol use: No  . Drug use: No  . Sexual activity: Not on file

## 2019-11-05 ENCOUNTER — Ambulatory Visit (INDEPENDENT_AMBULATORY_CARE_PROVIDER_SITE_OTHER): Payer: BC Managed Care – PPO | Admitting: Orthopaedic Surgery

## 2019-11-05 ENCOUNTER — Other Ambulatory Visit: Payer: Self-pay

## 2019-11-05 ENCOUNTER — Encounter: Payer: Self-pay | Admitting: Orthopaedic Surgery

## 2019-11-05 VITALS — Ht 67.0 in | Wt 239.0 lb

## 2019-11-05 DIAGNOSIS — M1712 Unilateral primary osteoarthritis, left knee: Secondary | ICD-10-CM | POA: Diagnosis not present

## 2019-11-05 MED ORDER — LIDOCAINE HCL 1 % IJ SOLN
0.5000 mL | INTRAMUSCULAR | Status: AC | PRN
  Administered 2019-11-05: .5 mL

## 2019-11-05 MED ORDER — HYLAN G-F 20 16 MG/2ML IX SOSY
16.0000 mg | PREFILLED_SYRINGE | INTRA_ARTICULAR | Status: AC | PRN
  Administered 2019-11-05: 16 mg via INTRA_ARTICULAR

## 2019-11-05 NOTE — Progress Notes (Signed)
   Office Visit Note   Patient: Christy Lynch           Date of Birth: 03/04/1956           MRN: [id number] Visit Date: 11/05/2019              Requested by: Antony Blackbird, MD [place],  [place] [address] PCP: Antony Blackbird, MD   Assessment & Plan: Visit Diagnoses:  1. Unilateral primary osteoarthritis, left knee     Plan: Second injection performed return 1 week for final left knee injection Synvisc.  Follow-Up Instructions: Return in about 1 week (around 11/12/2019).   Orders:  Orders Placed This Encounter  Procedures  . Large Joint Inj   No orders of the defined types were placed in this encounter.     Procedures: Large Joint Inj: L knee on 11/05/2019 3:09 PM Indications: joint swelling and pain Details: 22 G 1.5 in needle, anterolateral approach  Arthrogram: No  Medications: 0.5 mL lidocaine 1 %; 16 mg Hylan 16 MG/2ML Outcome: tolerated well, no immediate complications Procedure, treatment alternatives, risks and benefits explained, specific risks discussed. Consent was given by the patient. Immediately prior to procedure a time out was called to verify the correct patient, procedure, equipment, support staff and site/side marked as required. Patient was prepped and draped in the usual sterile fashion.       Clinical Data: No additional findings.   Subjective: Chief Complaint  Patient presents with  . Left Knee - Pain    Synvisc Injection #103    HPI 64 year old female returns for left knee Synvisc injection #2.  Review of Systems N/A   Objective: Vital Signs: Ht 5\' 7"  (1.702 m)   Wt 239 lb (108.4 kg)   BMI 37.43 kg/m   Physical Exam no change  Ortho Exam slight left knee swelling no abnormalities at the injection site from last week.  Specialty Comments:  No specialty comments available.  Imaging: No results found.   PMFS History: Patient Active Problem List   Diagnosis Date Noted  . Unilateral primary  osteoarthritis, left knee 10/14/2019  . Vertigo 08/21/2013  . Chest pain 07/02/2013  . Essential hypertension 07/02/2013  . Hyperlipidemia 07/02/2013   Past Medical History:  Diagnosis Date  . Bursitis of hip   . Esophageal reflux   . Hypertension   . Other and unspecified hyperlipidemia     Family History  Problem Relation Age of Onset  . Hypertension Mother   . Stroke Mother   . Kidney disease Father     Past Surgical History:  Procedure Laterality Date  . ABDOMINAL HYSTERECTOMY    . LEFT HEART CATH  12/12/2006   Social History   Occupational History  . Not on file  Tobacco Use  . Smoking status: Former Research scientist (life sciences)  . Smokeless tobacco: Never Used  Substance and Sexual Activity  . Alcohol use: No  . Drug use: No  . Sexual activity: Not on file

## 2019-11-11 ENCOUNTER — Ambulatory Visit (INDEPENDENT_AMBULATORY_CARE_PROVIDER_SITE_OTHER): Payer: BC Managed Care – PPO | Admitting: Orthopaedic Surgery

## 2019-11-11 ENCOUNTER — Encounter: Payer: Self-pay | Admitting: Orthopaedic Surgery

## 2019-11-11 ENCOUNTER — Other Ambulatory Visit: Payer: Self-pay

## 2019-11-11 VITALS — Ht 67.0 in | Wt 239.0 lb

## 2019-11-11 DIAGNOSIS — M1712 Unilateral primary osteoarthritis, left knee: Secondary | ICD-10-CM

## 2019-11-11 MED ORDER — HYLAN G-F 20 16 MG/2ML IX SOSY
16.0000 mg | PREFILLED_SYRINGE | INTRA_ARTICULAR | Status: AC | PRN
  Administered 2019-11-11: 16 mg via INTRA_ARTICULAR

## 2019-11-11 NOTE — Progress Notes (Signed)
Office Visit Note   Patient: Christy Lynch           Date of Birth: 1955/08/13           MRN: [id number] Visit Date: 11/11/2019              Requested by: Antony Blackbird, MD [place],  [place] [address] PCP: Antony Blackbird, MD   Assessment & Plan: Visit Diagnoses:  1. Unilateral primary osteoarthritis, left knee     Plan: Excellent response to the 2 prior Synvisc injections left knee.  We will proceed with the third.  She would like to consider viscosupplementation of the right knee.  Will pre-CERT This patient is diagnosed with osteoarthritis of the knee(s).    Radiographs show evidence of joint space narrowing, osteophytes, subchondral sclerosis and/or subchondral cysts.  This patient has knee pain which interferes with functional and activities of daily living.    This patient has experienced inadequate response, adverse effects and/or intolerance with conservative treatments such as acetaminophen, NSAIDS, topical creams, physical therapy or regular exercise, knee bracing and/or weight loss.   This patient has experienced inadequate response or has a contraindication to intra articular steroid injections for at least 3 months.   This patient is not scheduled to have a total knee replacement within 6 months of starting treatment with viscosupplementation.   Follow-Up Instructions: Return if symptoms worsen or fail to improve.   Orders:  No orders of the defined types were placed in this encounter.  No orders of the defined types were placed in this encounter.     Procedures: Large Joint Inj: L knee on 11/11/2019 3:53 PM Indications: pain and joint swelling Details: 25 G 1.5 in needle, anteromedial approach  Arthrogram: No  Medications: 16 mg Hylan 16 MG/2ML Outcome: tolerated well, no immediate complications Procedure, treatment alternatives, risks and benefits explained, specific risks discussed. Consent was given by the patient. Immediately  prior to procedure a time out was called to verify the correct patient, procedure, equipment, support staff and site/side marked as required. Patient was prepped and draped in the usual sterile fashion.       Clinical Data: No additional findings.   Subjective: Chief Complaint  Patient presents with  . Left Knee - Follow-up, Pain    Synvisc Injection #3 left knee  Patient returns for Synvisc Injection #3 of her left knee.   HPI  Review of Systems   Objective: Vital Signs: Ht 5\' 7"  (1.702 m)   Wt 239 lb (108.4 kg)   BMI 37.43 kg/m   Physical Exam  Ortho Exam left knee not hot red warm or swollen.  No effusion.  Specialty Comments:  No specialty comments available.  Imaging: No results found.   PMFS History: Patient Active Problem List   Diagnosis Date Noted  . Unilateral primary osteoarthritis, left knee 10/14/2019  . Vertigo 08/21/2013  . Chest pain 07/02/2013  . Essential hypertension 07/02/2013  . Hyperlipidemia 07/02/2013   Past Medical History:  Diagnosis Date  . Bursitis of hip   . Esophageal reflux   . Hypertension   . Other and unspecified hyperlipidemia     Family History  Problem Relation Age of Onset  . Hypertension Mother   . Stroke Mother   . Kidney disease Father     Past Surgical History:  Procedure Laterality Date  . ABDOMINAL HYSTERECTOMY    . LEFT HEART CATH  12/12/2006   Social History   Occupational History  .  Not on file  Tobacco Use  . Smoking status: Former Research scientist (life sciences)  . Smokeless tobacco: Never Used  Substance and Sexual Activity  . Alcohol use: No  . Drug use: No  . Sexual activity: Not on file

## 2019-11-25 ENCOUNTER — Encounter: Payer: Self-pay | Admitting: Orthopaedic Surgery

## 2019-11-25 ENCOUNTER — Other Ambulatory Visit: Payer: Self-pay

## 2019-11-25 ENCOUNTER — Ambulatory Visit (INDEPENDENT_AMBULATORY_CARE_PROVIDER_SITE_OTHER): Payer: BC Managed Care – PPO | Admitting: Orthopaedic Surgery

## 2019-11-25 DIAGNOSIS — M1711 Unilateral primary osteoarthritis, right knee: Secondary | ICD-10-CM

## 2019-11-25 MED ORDER — HYLAN G-F 20 16 MG/2ML IX SOSY
16.0000 mg | PREFILLED_SYRINGE | INTRA_ARTICULAR | Status: AC | PRN
  Administered 2019-11-25: 16 mg via INTRA_ARTICULAR

## 2019-11-25 NOTE — Progress Notes (Signed)
   Office Visit Note   Patient: Christy Lynch           Date of Birth: 1956/04/02           MRN: [id number] Visit Date: 11/25/2019              Requested by: Antony Blackbird, MD [place],  [place] [address] PCP: Antony Blackbird, MD   Assessment & Plan: Visit Diagnoses:  1. Unilateral primary osteoarthritis, right knee     Plan: First Synvisc injection right knee follow-up weekly for the next 2 weeks to complete the series  Follow-Up Instructions: Return in about 1 week (around 12/02/2019).   Orders:  Orders Placed This Encounter  Procedures  . Large Joint Inj: R knee   No orders of the defined types were placed in this encounter.     Procedures: Large Joint Inj: R knee on 11/25/2019 3:11 PM Indications: pain and joint swelling Details: 25 G 1.5 in needle  Arthrogram: No  Medications: 16 mg Hylan 16 MG/2ML Outcome: tolerated well, no immediate complications Procedure, treatment alternatives, risks and benefits explained, specific risks discussed. Consent was given by the patient. Immediately prior to procedure a time out was called to verify the correct patient, procedure, equipment, support staff and site/side marked as required. Patient was prepped and draped in the usual sterile fashion.       Clinical Data: No additional findings.   Subjective: Chief Complaint  Patient presents with  . Right Knee - Follow-up    Synvisc started 11-25-2019  Patient presents today for the first Synvisc injection in her right knee. She just finished the series on her left knee and is doing well. She said that her left knee throbs at night, but better during the day.    HPI  Review of Systems   Objective: Vital Signs: Ht 5\' 7"  (1.702 m)   Wt 239 lb (108.4 kg)   BMI 37.43 kg/m   Physical Exam  Ortho Exam right knee with possibly very minimal effusion.  Does have mostly medial joint pain.  Full extension and about 105 degrees of flexion without  instability.  Specialty Comments:  No specialty comments available.  Imaging: No results found.   PMFS History: Patient Active Problem List   Diagnosis Date Noted  . Unilateral primary osteoarthritis, right knee 11/25/2019  . Unilateral primary osteoarthritis, left knee 10/14/2019  . Vertigo 08/21/2013  . Chest pain 07/02/2013  . Essential hypertension 07/02/2013  . Hyperlipidemia 07/02/2013   Past Medical History:  Diagnosis Date  . Bursitis of hip   . Esophageal reflux   . Hypertension   . Other and unspecified hyperlipidemia     Family History  Problem Relation Age of Onset  . Hypertension Mother   . Stroke Mother   . Kidney disease Father     Past Surgical History:  Procedure Laterality Date  . ABDOMINAL HYSTERECTOMY    . LEFT HEART CATH  12/12/2006   Social History   Occupational History  . Not on file  Tobacco Use  . Smoking status: Former Research scientist (life sciences)  . Smokeless tobacco: Never Used  Substance and Sexual Activity  . Alcohol use: No  . Drug use: No  . Sexual activity: Not on file

## 2019-12-09 ENCOUNTER — Ambulatory Visit (INDEPENDENT_AMBULATORY_CARE_PROVIDER_SITE_OTHER): Payer: BC Managed Care – PPO | Admitting: Orthopaedic Surgery

## 2019-12-09 ENCOUNTER — Encounter: Payer: Self-pay | Admitting: Orthopaedic Surgery

## 2019-12-09 ENCOUNTER — Other Ambulatory Visit: Payer: Self-pay

## 2019-12-09 VITALS — Ht 67.0 in | Wt 239.0 lb

## 2019-12-09 DIAGNOSIS — M1711 Unilateral primary osteoarthritis, right knee: Secondary | ICD-10-CM

## 2019-12-09 MED ORDER — LIDOCAINE HCL 1 % IJ SOLN
2.0000 mL | INTRAMUSCULAR | Status: AC | PRN
  Administered 2019-12-09: 15:00:00 2 mL

## 2019-12-09 MED ORDER — HYLAN G-F 20 16 MG/2ML IX SOSY
16.0000 mg | PREFILLED_SYRINGE | INTRA_ARTICULAR | Status: AC | PRN
  Administered 2019-12-09: 16 mg via INTRA_ARTICULAR

## 2019-12-09 NOTE — Progress Notes (Signed)
   Office Visit Note   Patient: Christy Lynch           Date of Birth: 1955/06/21           MRN: [id number] Visit Date: 12/09/2019              Requested by: Antony Blackbird, MD [place],  [place] [address] PCP: Antony Blackbird, MD   Assessment & Plan: Visit Diagnoses:  1. Unilateral primary osteoarthritis, right knee     Plan: Second Synvisc injection right knee.  Doing well after the first  Follow-Up Instructions: Return in about 1 week (around 12/16/2019).   Orders:  Orders Placed This Encounter  Procedures  . Large Joint Inj: R knee   No orders of the defined types were placed in this encounter.     Procedures: Large Joint Inj: R knee on 12/09/2019 3:15 PM Indications: pain and joint swelling Details: 25 G 1.5 in needle  Arthrogram: No  Medications: 2 mL lidocaine 1 %; 16 mg Hylan 16 MG/2ML Outcome: tolerated well, no immediate complications Procedure, treatment alternatives, risks and benefits explained, specific risks discussed. Consent was given by the patient. Immediately prior to procedure a time out was called to verify the correct patient, procedure, equipment, support staff and site/side marked as required. Patient was prepped and draped in the usual sterile fashion.       Clinical Data: No additional findings.   Subjective: Chief Complaint  Patient presents with  . Right Knee - Follow-up    Synvisc Injection #2 Right Knee  Patient returns for Synvisc Injection #2 in her right knee. She states that she has noticed some improvement. She takes an occasional ibuprofen for pain.  HPI  Review of Systems   Objective: Vital Signs: Ht 5\' 7"  (1.702 m)   Wt (!) 239 lb (108.4 kg)   BMI 37.43 kg/m   Physical Exam  Ortho Exam right knee was not hot red warm or swollen.  Very minimal discomfort  Specialty Comments:  No specialty comments available.  Imaging: No results found.   PMFS History: Patient Active Problem List    Diagnosis Date Noted  . Unilateral primary osteoarthritis, right knee 11/25/2019  . Unilateral primary osteoarthritis, left knee 10/14/2019  . Vertigo 08/21/2013  . Chest pain 07/02/2013  . Essential hypertension 07/02/2013  . Hyperlipidemia 07/02/2013   Past Medical History:  Diagnosis Date  . Bursitis of hip   . Esophageal reflux   . Hypertension   . Other and unspecified hyperlipidemia     Family History  Problem Relation Age of Onset  . Hypertension Mother   . Stroke Mother   . Kidney disease Father     Past Surgical History:  Procedure Laterality Date  . ABDOMINAL HYSTERECTOMY    . LEFT HEART CATH  12/12/2006   Social History   Occupational History  . Not on file  Tobacco Use  . Smoking status: Former Research scientist (life sciences)  . Smokeless tobacco: Never Used  Substance and Sexual Activity  . Alcohol use: No  . Drug use: No  . Sexual activity: Not on file

## 2019-12-23 ENCOUNTER — Ambulatory Visit (INDEPENDENT_AMBULATORY_CARE_PROVIDER_SITE_OTHER): Payer: BC Managed Care – PPO | Admitting: Orthopaedic Surgery

## 2019-12-23 ENCOUNTER — Encounter: Payer: Self-pay | Admitting: Orthopaedic Surgery

## 2019-12-23 ENCOUNTER — Other Ambulatory Visit: Payer: Self-pay

## 2019-12-23 VITALS — Ht 67.0 in | Wt 239.0 lb

## 2019-12-23 DIAGNOSIS — M1711 Unilateral primary osteoarthritis, right knee: Secondary | ICD-10-CM

## 2019-12-23 MED ORDER — HYLAN G-F 20 16 MG/2ML IX SOSY
16.0000 mg | PREFILLED_SYRINGE | INTRA_ARTICULAR | Status: AC | PRN
  Administered 2019-12-23: 16 mg via INTRA_ARTICULAR

## 2019-12-23 NOTE — Progress Notes (Signed)
   Office Visit Note   Patient: Christy Lynch           Date of Birth: 02-May-1956           MRN: [id number] Visit Date: 12/23/2019              Requested by: Antony Blackbird, MD [place],  [place] [address] PCP: Antony Blackbird, MD   Assessment & Plan: Visit Diagnoses:  1. Unilateral primary osteoarthritis, right knee     Plan: Third and final Synvisc injection right knee.  Return as needed  Follow-Up Instructions: Return if symptoms worsen or fail to improve.   Orders:  Orders Placed This Encounter  Procedures  . Large Joint Inj   No orders of the defined types were placed in this encounter.     Procedures: Large Joint Inj: R knee on 12/23/2019 3:14 PM Indications: pain and joint swelling Details: 25 G 1.5 in needle  Arthrogram: No  Medications: 16 mg Hylan 16 MG/2ML Outcome: tolerated well, no immediate complications Procedure, treatment alternatives, risks and benefits explained, specific risks discussed. Consent was given by the patient. Immediately prior to procedure a time out was called to verify the correct patient, procedure, equipment, support staff and site/side marked as required. Patient was prepped and draped in the usual sterile fashion.       Clinical Data: No additional findings.   Subjective: Chief Complaint  Patient presents with  . Right Knee - Follow-up    Synvisc started 11/25/2019  Patient presents today for the third and final Synvisc injection for her right knee. She states that she is doing well and has noticed a lot of improvement already in her knee with the first two injections.  HPI  Review of Systems   Objective: Vital Signs: Ht 5\' 7"  (1.702 m)   Wt 239 lb (108.4 kg)   BMI 37.43 kg/m   Physical Exam  Ortho Exam right knee was not hot red warm or swollen.  No effusion and no significant pain  Specialty Comments:  No specialty comments available.  Imaging: No results found.   PMFS  History: Patient Active Problem List   Diagnosis Date Noted  . Unilateral primary osteoarthritis, left knee 10/14/2019  . Vertigo 08/21/2013  . Chest pain 07/02/2013  . Essential hypertension 07/02/2013  . Hyperlipidemia 07/02/2013   Past Medical History:  Diagnosis Date  . Bursitis of hip   . Esophageal reflux   . Hypertension   . Other and unspecified hyperlipidemia     Family History  Problem Relation Age of Onset  . Hypertension Mother   . Stroke Mother   . Kidney disease Father     Past Surgical History:  Procedure Laterality Date  . ABDOMINAL HYSTERECTOMY    . LEFT HEART CATH  12/12/2006   Social History   Occupational History  . Not on file  Tobacco Use  . Smoking status: Former Research scientist (life sciences)  . Smokeless tobacco: Never Used  Substance and Sexual Activity  . Alcohol use: No  . Drug use: No  . Sexual activity: Not on file

## 2020-02-11 ENCOUNTER — Other Ambulatory Visit: Payer: Self-pay | Admitting: Student

## 2020-02-11 DIAGNOSIS — Z1231 Encounter for screening mammogram for malignant neoplasm of breast: Secondary | ICD-10-CM

## 2020-02-12 ENCOUNTER — Other Ambulatory Visit: Payer: Self-pay | Admitting: Student

## 2020-02-12 DIAGNOSIS — Z1231 Encounter for screening mammogram for malignant neoplasm of breast: Secondary | ICD-10-CM

## 2020-02-18 ENCOUNTER — Other Ambulatory Visit: Payer: Self-pay

## 2020-02-18 ENCOUNTER — Ambulatory Visit
Admission: RE | Admit: 2020-02-18 | Discharge: 2020-02-18 | Disposition: A | Discharge status: Home / Self Care | Payer: BC Managed Care – PPO | Source: Ambulatory Visit | Attending: Internal Medicine | Admitting: Internal Medicine

## 2020-02-18 DIAGNOSIS — Z1231 Encounter for screening mammogram for malignant neoplasm of breast: Secondary | ICD-10-CM

## 2020-02-25 ENCOUNTER — Other Ambulatory Visit: Payer: Self-pay | Admitting: *Deleted

## 2020-02-25 ENCOUNTER — Inpatient Hospital Stay
Admission: RE | Admit: 2020-02-25 | Discharge: 2020-02-25 | Disposition: A | Discharge status: Home / Self Care | Payer: Self-pay | Source: Ambulatory Visit | Attending: *Deleted | Admitting: *Deleted

## 2020-02-25 DIAGNOSIS — Z1231 Encounter for screening mammogram for malignant neoplasm of breast: Secondary | ICD-10-CM

## 2020-03-01 ENCOUNTER — Telehealth: Payer: Self-pay | Admitting: Orthopaedic Surgery

## 2020-03-01 NOTE — Telephone Encounter (Signed)
Patient called to relay that she spoke with someone last week and discussed a possible appointment for today, 03/01/20, however, it then came up that patient is currently treating with another orthopaedic specialist. (Notes are in Epic chart). States appointment was "taken off", due to reason of second opinion, which requires review of records and films, and approval by one of our orthopaedic providers.* *Discussed with staff who spoke with patient - correct, appointment scheduling not completed for these reasons. Called back to patient; she voiced understanding.

## 2020-04-26 ENCOUNTER — Ambulatory Visit: Payer: BC Managed Care – PPO | Admitting: Orthopaedic Surgery

## 2020-05-17 ENCOUNTER — Ambulatory Visit: Payer: BC Managed Care – PPO | Admitting: Orthopaedic Surgery

## 2020-10-13 DIAGNOSIS — M25562 Pain in left knee: Secondary | ICD-10-CM | POA: Diagnosis not present

## 2020-10-13 DIAGNOSIS — I10 Essential (primary) hypertension: Secondary | ICD-10-CM | POA: Diagnosis not present

## 2020-10-13 DIAGNOSIS — Z713 Dietary counseling and surveillance: Secondary | ICD-10-CM | POA: Diagnosis not present

## 2020-10-13 DIAGNOSIS — Z299 Encounter for prophylactic measures, unspecified: Secondary | ICD-10-CM | POA: Diagnosis not present

## 2020-11-29 DIAGNOSIS — Z299 Encounter for prophylactic measures, unspecified: Secondary | ICD-10-CM | POA: Diagnosis not present

## 2020-11-29 DIAGNOSIS — I10 Essential (primary) hypertension: Secondary | ICD-10-CM | POA: Diagnosis not present

## 2020-11-29 DIAGNOSIS — R079 Chest pain, unspecified: Secondary | ICD-10-CM | POA: Diagnosis not present

## 2020-11-29 DIAGNOSIS — K219 Gastro-esophageal reflux disease without esophagitis: Secondary | ICD-10-CM | POA: Diagnosis not present

## 2020-12-06 DIAGNOSIS — R079 Chest pain, unspecified: Secondary | ICD-10-CM | POA: Diagnosis not present

## 2020-12-07 DIAGNOSIS — R0789 Other chest pain: Secondary | ICD-10-CM | POA: Diagnosis not present

## 2020-12-07 DIAGNOSIS — Z299 Encounter for prophylactic measures, unspecified: Secondary | ICD-10-CM | POA: Diagnosis not present

## 2020-12-07 DIAGNOSIS — I10 Essential (primary) hypertension: Secondary | ICD-10-CM | POA: Diagnosis not present

## 2020-12-07 DIAGNOSIS — R079 Chest pain, unspecified: Secondary | ICD-10-CM | POA: Diagnosis not present

## 2020-12-23 DIAGNOSIS — R079 Chest pain, unspecified: Secondary | ICD-10-CM | POA: Diagnosis not present

## 2021-01-05 DIAGNOSIS — I10 Essential (primary) hypertension: Secondary | ICD-10-CM | POA: Diagnosis not present

## 2021-01-05 DIAGNOSIS — N390 Urinary tract infection, site not specified: Secondary | ICD-10-CM | POA: Diagnosis not present

## 2021-01-05 DIAGNOSIS — Z299 Encounter for prophylactic measures, unspecified: Secondary | ICD-10-CM | POA: Diagnosis not present

## 2021-01-10 DIAGNOSIS — R079 Chest pain, unspecified: Secondary | ICD-10-CM | POA: Diagnosis not present

## 2021-01-12 DIAGNOSIS — Z299 Encounter for prophylactic measures, unspecified: Secondary | ICD-10-CM | POA: Diagnosis not present

## 2021-01-12 DIAGNOSIS — I10 Essential (primary) hypertension: Secondary | ICD-10-CM | POA: Diagnosis not present

## 2021-01-12 DIAGNOSIS — R109 Unspecified abdominal pain: Secondary | ICD-10-CM | POA: Diagnosis not present

## 2021-01-13 DIAGNOSIS — R000 Tachycardia, unspecified: Secondary | ICD-10-CM | POA: Diagnosis not present

## 2021-01-19 DIAGNOSIS — R911 Solitary pulmonary nodule: Secondary | ICD-10-CM | POA: Diagnosis not present

## 2021-01-19 DIAGNOSIS — N281 Cyst of kidney, acquired: Secondary | ICD-10-CM | POA: Diagnosis not present

## 2021-01-19 DIAGNOSIS — R109 Unspecified abdominal pain: Secondary | ICD-10-CM | POA: Diagnosis not present

## 2021-01-19 DIAGNOSIS — R9389 Abnormal findings on diagnostic imaging of other specified body structures: Secondary | ICD-10-CM | POA: Diagnosis not present

## 2021-01-19 DIAGNOSIS — R200 Anesthesia of skin: Secondary | ICD-10-CM | POA: Diagnosis not present

## 2021-01-27 DIAGNOSIS — I700 Atherosclerosis of aorta: Secondary | ICD-10-CM | POA: Diagnosis not present

## 2021-01-27 DIAGNOSIS — I10 Essential (primary) hypertension: Secondary | ICD-10-CM | POA: Diagnosis not present

## 2021-01-27 DIAGNOSIS — M869 Osteomyelitis, unspecified: Secondary | ICD-10-CM | POA: Diagnosis not present

## 2021-01-27 DIAGNOSIS — Z299 Encounter for prophylactic measures, unspecified: Secondary | ICD-10-CM | POA: Diagnosis not present

## 2021-01-27 DIAGNOSIS — R109 Unspecified abdominal pain: Secondary | ICD-10-CM | POA: Diagnosis not present

## 2021-02-03 DIAGNOSIS — H524 Presbyopia: Secondary | ICD-10-CM | POA: Diagnosis not present

## 2021-02-03 DIAGNOSIS — H35033 Hypertensive retinopathy, bilateral: Secondary | ICD-10-CM | POA: Diagnosis not present

## 2021-02-06 DIAGNOSIS — M25551 Pain in right hip: Secondary | ICD-10-CM | POA: Diagnosis not present

## 2021-02-06 DIAGNOSIS — M25552 Pain in left hip: Secondary | ICD-10-CM | POA: Diagnosis not present

## 2021-02-20 ENCOUNTER — Other Ambulatory Visit: Payer: Self-pay | Admitting: Internal Medicine

## 2021-02-20 ENCOUNTER — Other Ambulatory Visit: Payer: Self-pay

## 2021-02-20 ENCOUNTER — Ambulatory Visit
Admission: RE | Admit: 2021-02-20 | Discharge: 2021-02-20 | Disposition: A | Discharge status: Home / Self Care | Payer: Medicare Other | Source: Ambulatory Visit | Attending: Internal Medicine | Admitting: Internal Medicine

## 2021-02-20 DIAGNOSIS — Z1231 Encounter for screening mammogram for malignant neoplasm of breast: Secondary | ICD-10-CM

## 2021-05-09 DIAGNOSIS — I10 Essential (primary) hypertension: Secondary | ICD-10-CM | POA: Diagnosis not present

## 2021-05-09 DIAGNOSIS — U071 COVID-19: Secondary | ICD-10-CM | POA: Diagnosis not present

## 2021-05-09 DIAGNOSIS — R110 Nausea: Secondary | ICD-10-CM | POA: Diagnosis not present

## 2021-05-09 DIAGNOSIS — Z299 Encounter for prophylactic measures, unspecified: Secondary | ICD-10-CM | POA: Diagnosis not present

## 2021-05-16 DIAGNOSIS — M869 Osteomyelitis, unspecified: Secondary | ICD-10-CM | POA: Diagnosis not present

## 2021-05-16 DIAGNOSIS — Z299 Encounter for prophylactic measures, unspecified: Secondary | ICD-10-CM | POA: Diagnosis not present

## 2021-05-16 DIAGNOSIS — I10 Essential (primary) hypertension: Secondary | ICD-10-CM | POA: Diagnosis not present

## 2021-05-16 DIAGNOSIS — J40 Bronchitis, not specified as acute or chronic: Secondary | ICD-10-CM | POA: Diagnosis not present

## 2021-06-26 DIAGNOSIS — J029 Acute pharyngitis, unspecified: Secondary | ICD-10-CM | POA: Diagnosis not present

## 2021-06-26 DIAGNOSIS — Z299 Encounter for prophylactic measures, unspecified: Secondary | ICD-10-CM | POA: Diagnosis not present

## 2021-06-26 DIAGNOSIS — I700 Atherosclerosis of aorta: Secondary | ICD-10-CM | POA: Diagnosis not present

## 2021-06-26 DIAGNOSIS — I10 Essential (primary) hypertension: Secondary | ICD-10-CM | POA: Diagnosis not present

## 2021-08-25 DIAGNOSIS — R42 Dizziness and giddiness: Secondary | ICD-10-CM | POA: Diagnosis not present

## 2021-08-25 DIAGNOSIS — Z299 Encounter for prophylactic measures, unspecified: Secondary | ICD-10-CM | POA: Diagnosis not present

## 2021-08-25 DIAGNOSIS — I10 Essential (primary) hypertension: Secondary | ICD-10-CM | POA: Diagnosis not present

## 2021-08-31 DIAGNOSIS — Z299 Encounter for prophylactic measures, unspecified: Secondary | ICD-10-CM | POA: Diagnosis not present

## 2021-08-31 DIAGNOSIS — R42 Dizziness and giddiness: Secondary | ICD-10-CM | POA: Diagnosis not present

## 2021-08-31 DIAGNOSIS — I10 Essential (primary) hypertension: Secondary | ICD-10-CM | POA: Diagnosis not present

## 2021-10-11 DIAGNOSIS — I10 Essential (primary) hypertension: Secondary | ICD-10-CM | POA: Diagnosis not present

## 2021-10-11 DIAGNOSIS — R42 Dizziness and giddiness: Secondary | ICD-10-CM | POA: Diagnosis not present

## 2021-10-11 DIAGNOSIS — Z299 Encounter for prophylactic measures, unspecified: Secondary | ICD-10-CM | POA: Diagnosis not present

## 2021-10-19 DIAGNOSIS — Z299 Encounter for prophylactic measures, unspecified: Secondary | ICD-10-CM | POA: Diagnosis not present

## 2021-10-19 DIAGNOSIS — R42 Dizziness and giddiness: Secondary | ICD-10-CM | POA: Diagnosis not present

## 2021-10-19 DIAGNOSIS — I10 Essential (primary) hypertension: Secondary | ICD-10-CM | POA: Diagnosis not present

## 2021-10-19 DIAGNOSIS — Z789 Other specified health status: Secondary | ICD-10-CM | POA: Diagnosis not present

## 2021-10-19 DIAGNOSIS — R0989 Other specified symptoms and signs involving the circulatory and respiratory systems: Secondary | ICD-10-CM | POA: Diagnosis not present

## 2021-10-27 DIAGNOSIS — H8110 Benign paroxysmal vertigo, unspecified ear: Secondary | ICD-10-CM | POA: Diagnosis not present

## 2021-10-27 DIAGNOSIS — R42 Dizziness and giddiness: Secondary | ICD-10-CM | POA: Diagnosis not present

## 2021-10-30 DIAGNOSIS — H8110 Benign paroxysmal vertigo, unspecified ear: Secondary | ICD-10-CM | POA: Diagnosis not present

## 2021-10-30 DIAGNOSIS — R42 Dizziness and giddiness: Secondary | ICD-10-CM | POA: Diagnosis not present

## 2021-11-09 DIAGNOSIS — R0989 Other specified symptoms and signs involving the circulatory and respiratory systems: Secondary | ICD-10-CM | POA: Diagnosis not present

## 2021-11-09 DIAGNOSIS — I6523 Occlusion and stenosis of bilateral carotid arteries: Secondary | ICD-10-CM | POA: Diagnosis not present

## 2021-11-09 DIAGNOSIS — I771 Stricture of artery: Secondary | ICD-10-CM | POA: Diagnosis not present

## 2021-12-01 DIAGNOSIS — K648 Other hemorrhoids: Secondary | ICD-10-CM | POA: Diagnosis not present

## 2021-12-01 DIAGNOSIS — Z86010 Personal history of colonic polyps: Secondary | ICD-10-CM | POA: Diagnosis not present

## 2021-12-01 DIAGNOSIS — Z09 Encounter for follow-up examination after completed treatment for conditions other than malignant neoplasm: Secondary | ICD-10-CM | POA: Diagnosis not present

## 2022-03-12 ENCOUNTER — Other Ambulatory Visit: Payer: Self-pay | Admitting: Internal Medicine

## 2022-03-12 DIAGNOSIS — Z1231 Encounter for screening mammogram for malignant neoplasm of breast: Secondary | ICD-10-CM

## 2022-03-26 ENCOUNTER — Ambulatory Visit
Admission: RE | Admit: 2022-03-26 | Discharge: 2022-03-26 | Disposition: A | Discharge status: Home / Self Care | Payer: Medicare Other | Source: Ambulatory Visit | Attending: Internal Medicine | Admitting: Internal Medicine

## 2022-03-26 DIAGNOSIS — Z1231 Encounter for screening mammogram for malignant neoplasm of breast: Secondary | ICD-10-CM | POA: Diagnosis not present

## 2022-03-29 ENCOUNTER — Other Ambulatory Visit: Payer: Self-pay | Admitting: Internal Medicine

## 2022-03-29 DIAGNOSIS — R928 Other abnormal and inconclusive findings on diagnostic imaging of breast: Secondary | ICD-10-CM

## 2022-03-30 DIAGNOSIS — J069 Acute upper respiratory infection, unspecified: Secondary | ICD-10-CM | POA: Diagnosis not present

## 2022-03-30 DIAGNOSIS — M869 Osteomyelitis, unspecified: Secondary | ICD-10-CM | POA: Diagnosis not present

## 2022-03-30 DIAGNOSIS — I700 Atherosclerosis of aorta: Secondary | ICD-10-CM | POA: Diagnosis not present

## 2022-04-12 DIAGNOSIS — I10 Essential (primary) hypertension: Secondary | ICD-10-CM | POA: Diagnosis not present

## 2022-04-12 DIAGNOSIS — M79672 Pain in left foot: Secondary | ICD-10-CM | POA: Diagnosis not present

## 2022-04-12 DIAGNOSIS — Z299 Encounter for prophylactic measures, unspecified: Secondary | ICD-10-CM | POA: Diagnosis not present

## 2022-04-12 DIAGNOSIS — M79605 Pain in left leg: Secondary | ICD-10-CM | POA: Diagnosis not present

## 2022-04-18 ENCOUNTER — Ambulatory Visit
Admission: RE | Admit: 2022-04-18 | Discharge: 2022-04-18 | Disposition: A | Discharge status: Home / Self Care | Payer: Medicare Other | Source: Ambulatory Visit | Attending: Internal Medicine | Admitting: Internal Medicine

## 2022-04-18 DIAGNOSIS — N6321 Unspecified lump in the left breast, upper outer quadrant: Secondary | ICD-10-CM | POA: Diagnosis not present

## 2022-04-18 DIAGNOSIS — R928 Other abnormal and inconclusive findings on diagnostic imaging of breast: Secondary | ICD-10-CM

## 2022-04-18 DIAGNOSIS — R92322 Mammographic fibroglandular density, left breast: Secondary | ICD-10-CM | POA: Diagnosis not present

## 2022-04-19 ENCOUNTER — Other Ambulatory Visit: Payer: Self-pay | Admitting: Internal Medicine

## 2022-04-19 DIAGNOSIS — M79662 Pain in left lower leg: Secondary | ICD-10-CM | POA: Diagnosis not present

## 2022-04-19 DIAGNOSIS — N6320 Unspecified lump in the left breast, unspecified quadrant: Secondary | ICD-10-CM

## 2022-04-19 DIAGNOSIS — M7122 Synovial cyst of popliteal space [Baker], left knee: Secondary | ICD-10-CM | POA: Diagnosis not present

## 2022-04-19 DIAGNOSIS — I824Y2 Acute embolism and thrombosis of unspecified deep veins of left proximal lower extremity: Secondary | ICD-10-CM | POA: Diagnosis not present

## 2022-04-30 ENCOUNTER — Ambulatory Visit
Admission: RE | Admit: 2022-04-30 | Discharge: 2022-04-30 | Disposition: A | Discharge status: Home / Self Care | Payer: Medicare Other | Source: Ambulatory Visit | Attending: Internal Medicine | Admitting: Internal Medicine

## 2022-04-30 DIAGNOSIS — C50412 Malignant neoplasm of upper-outer quadrant of left female breast: Secondary | ICD-10-CM | POA: Diagnosis not present

## 2022-04-30 DIAGNOSIS — N6320 Unspecified lump in the left breast, unspecified quadrant: Secondary | ICD-10-CM

## 2022-04-30 DIAGNOSIS — N6321 Unspecified lump in the left breast, upper outer quadrant: Secondary | ICD-10-CM | POA: Diagnosis not present

## 2022-04-30 DIAGNOSIS — M797 Fibromyalgia: Secondary | ICD-10-CM | POA: Diagnosis not present

## 2022-04-30 DIAGNOSIS — R5383 Other fatigue: Secondary | ICD-10-CM | POA: Diagnosis not present

## 2022-04-30 HISTORY — PX: BREAST BIOPSY: SHX20

## 2022-05-02 DIAGNOSIS — Z713 Dietary counseling and surveillance: Secondary | ICD-10-CM | POA: Diagnosis not present

## 2022-05-02 DIAGNOSIS — I10 Essential (primary) hypertension: Secondary | ICD-10-CM | POA: Diagnosis not present

## 2022-05-02 DIAGNOSIS — Z299 Encounter for prophylactic measures, unspecified: Secondary | ICD-10-CM | POA: Diagnosis not present

## 2022-05-02 DIAGNOSIS — C50912 Malignant neoplasm of unspecified site of left female breast: Secondary | ICD-10-CM | POA: Diagnosis not present

## 2022-05-14 DIAGNOSIS — C801 Malignant (primary) neoplasm, unspecified: Secondary | ICD-10-CM

## 2022-05-14 HISTORY — DX: Malignant (primary) neoplasm, unspecified: C80.1

## 2022-05-18 ENCOUNTER — Other Ambulatory Visit: Payer: Self-pay | Admitting: General Surgery

## 2022-05-18 DIAGNOSIS — Z170 Estrogen receptor positive status [ER+]: Secondary | ICD-10-CM

## 2022-05-18 DIAGNOSIS — Z809 Family history of malignant neoplasm, unspecified: Secondary | ICD-10-CM | POA: Diagnosis not present

## 2022-05-18 DIAGNOSIS — C50412 Malignant neoplasm of upper-outer quadrant of left female breast: Secondary | ICD-10-CM | POA: Diagnosis not present

## 2022-05-21 ENCOUNTER — Other Ambulatory Visit: Payer: Self-pay | Admitting: General Surgery

## 2022-05-21 DIAGNOSIS — Z170 Estrogen receptor positive status [ER+]: Secondary | ICD-10-CM

## 2022-05-22 ENCOUNTER — Other Ambulatory Visit: Payer: Self-pay | Admitting: *Deleted

## 2022-05-22 ENCOUNTER — Other Ambulatory Visit: Payer: Self-pay | Admitting: General Surgery

## 2022-05-22 DIAGNOSIS — Z170 Estrogen receptor positive status [ER+]: Secondary | ICD-10-CM

## 2022-05-22 DIAGNOSIS — C50412 Malignant neoplasm of upper-outer quadrant of left female breast: Secondary | ICD-10-CM

## 2022-05-25 ENCOUNTER — Encounter: Payer: Self-pay | Admitting: *Deleted

## 2022-05-25 ENCOUNTER — Inpatient Hospital Stay (HOSPITAL_BASED_OUTPATIENT_CLINIC_OR_DEPARTMENT_OTHER): Payer: Medicare Other | Admitting: Hematology and Oncology

## 2022-05-25 ENCOUNTER — Inpatient Hospital Stay: Payer: Medicare Other | Attending: Hematology and Oncology

## 2022-05-25 VITALS — BP 117/76 | HR 63 | Temp 97.1°F | Resp 18 | Wt 248.4 lb

## 2022-05-25 DIAGNOSIS — Z833 Family history of diabetes mellitus: Secondary | ICD-10-CM

## 2022-05-25 DIAGNOSIS — C50412 Malignant neoplasm of upper-outer quadrant of left female breast: Secondary | ICD-10-CM | POA: Diagnosis not present

## 2022-05-25 DIAGNOSIS — Z841 Family history of disorders of kidney and ureter: Secondary | ICD-10-CM | POA: Diagnosis not present

## 2022-05-25 DIAGNOSIS — Z87891 Personal history of nicotine dependence: Secondary | ICD-10-CM

## 2022-05-25 DIAGNOSIS — Z170 Estrogen receptor positive status [ER+]: Secondary | ICD-10-CM

## 2022-05-25 DIAGNOSIS — Z8249 Family history of ischemic heart disease and other diseases of the circulatory system: Secondary | ICD-10-CM | POA: Insufficient documentation

## 2022-05-25 DIAGNOSIS — Z90710 Acquired absence of both cervix and uterus: Secondary | ICD-10-CM | POA: Diagnosis not present

## 2022-05-25 DIAGNOSIS — Z803 Family history of malignant neoplasm of breast: Secondary | ICD-10-CM | POA: Diagnosis not present

## 2022-05-25 DIAGNOSIS — Z79899 Other long term (current) drug therapy: Secondary | ICD-10-CM

## 2022-05-25 DIAGNOSIS — Z881 Allergy status to other antibiotic agents status: Secondary | ICD-10-CM

## 2022-05-25 LAB — RESEARCH LABS

## 2022-05-25 NOTE — Research (Signed)
Trial: [employer] 2021-05 - Specimen Collection Study to Evaluate Biomarkers in Subjects with Cancer    [name] was identified by Dr. Lindi Adie as a potential candidate for the above listed study.  This Clinical Research Nurse met with Christy Lynch, [id number], on 05/25/22 in a manner and location that ensures patient privacy to discuss participation in the above listed research study.  Patient is Unaccompanied.  A copy of the informed consent document with embedded HIPAA language was provided to the patient.  Patient reads, speaks, and understands [place].    Patient was provided with the business card of this Nurse and encouraged to contact the research team with any questions.  Patient was provided the option of taking informed consent documents home to review and was encouraged to review at their convenience with their support network, including other care providers. Patient is comfortable with making a decision regarding study participation today.  As outlined in the informed consent form, this Nurse and Christy Lynch discussed the purpose of the research study, the investigational nature of the study, study procedures and requirements for study participation, potential risks and benefits of study participation, as well as alternatives to participation. This study is not blinded. The patient understands participation is voluntary and they may withdraw from study participation at any time.  This study does not involve randomization.  This study does not involve an investigational drug or device. This study does not involve a placebo. Patient understands enrollment is pending full eligibility review.   Confidentiality and how the patient's information will be used as part of study participation were discussed.  Patient was informed there is reimbursement provided for their time and effort spent on trial participation.  The patient is encouraged to discuss research  study participation with their insurance provider to determine what costs they may incur as part of study participation, including research related injury.    All questions were answered to patient's satisfaction.  The informed consent with embedded HIPAA language was reviewed page by page.  The patient's mental and emotional status is appropriate to provide informed consent, and the patient verbalizes an understanding of study participation.  Patient has agreed to participate in the above listed research study and has voluntarily signed the informed consent IRB approved version 27 May 2020, revised 12 Jun 2021 with embedded HIPAA language on 05/25/22 at 10:00AM.  The patient was provided with a copy of the signed informed consent form with embedded HIPAA language for their reference.  No study specific procedures were obtained prior to the signing of the informed consent document.  Approximately 20 minutes were spent with the patient reviewing the informed consent documents.  Patient was not requested to complete a Release of Information form.  Data Collection:  Medical History:  High Blood Pressure  Yes Coronary Artery Disease No Lupus    No Rheumatoid Arthritis  No Diabetes   No      Lynch Syndrome  No  Is the patient currently taking a magnesium supplement?   No  Does the patient have a personal history of cancer (greater than 5 years ago)?  No  Does the patient have a family history of cancer in 1st or 2nd degree relatives? Yes If yes, Relationship(s) and Cancer type(s)? Mother- Breast Cancer and Uncle- Colon Cancer  Does the patient have history of alcohol consumption? No    Does the patient have history of cigarette, cigar, pipe, or chewing tobacco use?  Yes  If yes, current  for former? Former If yes, type (Cigarette, cigar, pipe, and/or chewing tobacco)? Cigarettes   If former, year stopped? 1994 Number of years? 4 Packs/number/containers per day?  1 pack every 4-5 days= 0.25 pack  per day    Eligibility: Eligibility criteria reviewed with patient. This Nurse has reviewed this patient's inclusion and exclusion criteria and confirmed patient is eligible for study participation. Eligibility confirmed by treating investigator, who also agrees that patient should proceed with enrollment. Patient will continue with enrollment.  Blood Collection: Research blood obtained by Fresh venipuncture. Patient tolerated well without any adverse events.  Gift Card: $50 gift card given to patient for her participation in this study.    Patient was thanked for her participation in this research study.  Foye Spurling, BSN, RN, [place] Nurse II [phone] 05/25/2022 10:55 AM

## 2022-05-25 NOTE — Progress Notes (Signed)
[facility] CONSULT NOTE  Patient Care Team: Glenda Chroman, MD as PCP - General (Internal Medicine)  CHIEF COMPLAINTS/PURPOSE OF CONSULTATION:  Newly diagnosed breast cancer  HISTORY OF PRESENTING ILLNESS:  Christy Lynch 67 y.o. female is here because of recent diagnosis of left breast cancer.  Patient routine screening mammogram detected left breast abnormality which was evaluated by ultrasound.  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  She was seen by Dr. Barry Dienes who is setting her up for a breast MRI.  She was referred to [place] for discussion regarding adjuvant treatment options.  I reviewed her records extensively and collaborated the history with the patient.  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% Ki-67 60%, HER2 3+ positive   05/25/2022 Cancer Staging   Staging form: Breast, AJCC 8th Edition - Clinical: Stage IA (cT1b, cN0, cM0, G3, ER+, PR+, HER2+) - Signed by Nicholas Lose, MD on 05/25/2022 Stage prefix: Initial diagnosis Histologic grading system: 3 grade system      MEDICAL HISTORY:  Past Medical History:  Diagnosis Date   Bursitis of hip    Esophageal reflux    Hypertension    Other and unspecified hyperlipidemia     SURGICAL HISTORY: Past Surgical History:  Procedure Laterality Date   ABDOMINAL HYSTERECTOMY     BREAST BIOPSY Left    neg bx early 2000's   BREAST BIOPSY Left 04/30/2022   [place] LT BREAST BX W LOC DEV 1ST LESION IMG BX SPEC US GUIDE 04/30/2022 [facility]   BREAST BIOPSY Left 04/30/2022   [place] LT BREAST BX W LOC DEV EA ADD LESION IMG BX SPEC US GUIDE 04/30/2022  [facility]   LEFT HEART CATH  12/12/2006    SOCIAL HISTORY: Social History   Socioeconomic History   Marital status: Married    Spouse name: Not on file   Number of children: Not on file   Years of education: Not on file   Highest education level: Not on file  Occupational History   Not on file  Tobacco Use   Smoking status: Former   Smokeless tobacco: Never  Substance and Sexual Activity   Alcohol use: No   Drug use: No   Sexual activity: Not on file  Other Topics Concern   Not on file  Social History Narrative   Not on file   Social Determinants of Health   Financial Resource Strain: Not on file  Food Insecurity: Not on file  Transportation Needs: Not on file  Physical Activity: Not on file  Stress: Not on file  Social Connections: Not on file  Intimate Partner Violence: Not on file    FAMILY HISTORY: Family History  Problem Relation Age of Onset   Breast cancer Mother    Hypertension Mother    Stroke Mother    Kidney disease Father     ALLERGIES:  is allergic to flagyl [metronidazole].  MEDICATIONS:  Current Outpatient Medications  Medication Sig Dispense Refill   allopurinol (ZYLOPRIM) 100 MG tablet Take 100 mg by mouth every evening.     atenolol (TENORMIN) 50 MG tablet Take 50  mg by mouth every evening.     cyclobenzaprine (FLEXERIL) 10 MG tablet Take 10 mg by mouth at bedtime as needed for muscle spasms.     DULoxetine (CYMBALTA) 60 MG capsule Take 60 mg by mouth every evening.     esomeprazole (NEXIUM) 40 MG capsule Take 40 mg by mouth daily at 12 noon.     HYDROcodone-acetaminophen (NORCO/VICODIN) 5-325 MG tablet One tablet by mouth every six hours as needed for pain.  Seven day limit 28 tablet 0   lisinopril-hydrochlorothiazide (PRINZIDE,ZESTORETIC) 20-12.5 MG per tablet Take 1 tablet by mouth daily.     meclizine (ANTIVERT) 25 MG tablet Take 1 tablet (25 mg total) by mouth 2 (two) times daily as needed for dizziness. 60 tablet 5   Multiple  Vitamin (MULTIVITAMIN WITH MINERALS) TABS tablet Take 1 tablet by mouth every evening.     No current facility-administered medications for this visit.    REVIEW OF SYSTEMS:   Constitutional: Denies fevers, chills or abnormal night sweats   All other systems were reviewed with the patient and are negative.  PHYSICAL EXAMINATION: ECOG PERFORMANCE STATUS: 0 - Asymptomatic  Vitals:   05/25/22 0904  BP: 117/76  Pulse: 63  Resp: 18  Temp: (!) 97.1 F (36.2 C)  SpO2: 100%   Filed Weights   05/25/22 0904  Weight: 248 lb 7 oz (112.7 kg)    GENERAL:alert, no distress and comfortable    LABORATORY DATA:  I have reviewed the data as listed Lab Results  Component Value Date   WBC 7.6 03/23/2013   HGB 12.5 03/23/2013   HCT 37.7 03/23/2013   MCV 88.1 03/23/2013   PLT 262 03/23/2013   Lab Results  Component Value Date   NA 142 03/23/2013   K 4.1 03/23/2013   CL 105 03/23/2013   CO2 29 03/23/2013    RADIOGRAPHIC STUDIES: I have personally reviewed the radiological reports and agreed with the findings in the report.  ASSESSMENT AND PLAN:  Malignant neoplasm of upper-outer quadrant of left breast in female, estrogen receptor positive ([place]) 04/30/2022: Screening mammogram detected left breast mass: Diagnostic mammogram and ultrasound revealed 2 o'clock position left breast irregular hypoechoic mass with calcification measuring 0.7 cm.  Adjacent to this another irregular hypoechoic mass 0.5 cm.  Axilla negative.  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 After reviewing the pathology in detail, we proceeded to discuss the different treatment options between surgery, radiation, chemotherapy, antiestrogen  therapies.  Treatment plan: Breast conserving surgery with sentinel lymph node biopsy Adjuvant chemotherapy with Taxol Herceptin followed by Herceptin maintenance Adjuvant radiation Adjuvant antiestrogen therapy  Return to clinic after surgery to discuss final pathology report and finalize her treatment plan.  All questions were answered. The patient knows to call the clinic with any problems, questions or concerns.    Harriette Ohara, MD 05/25/22

## 2022-05-25 NOTE — Assessment & Plan Note (Signed)
04/30/2022: Screening mammogram detected left breast mass: Diagnostic mammogram and ultrasound revealed 2 o'clock position left breast irregular hypoechoic mass with calcification measuring 0.7 cm.  Adjacent to this another irregular hypoechoic mass 0.5 cm.  Axilla negative.  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 After reviewing the pathology in detail, we proceeded to discuss the different treatment options between surgery, radiation, chemotherapy, antiestrogen therapies.  Treatment plan: Breast conserving surgery with sentinel lymph node biopsy Adjuvant chemotherapy with Taxol Herceptin followed by Herceptin maintenance Adjuvant radiation Adjuvant antiestrogen therapy  Return to clinic after surgery to discuss final pathology report and finalize her treatment plan.

## 2022-05-25 NOTE — Research (Signed)
[employer] 2021-05 - Specimen Collection Study to Evaluate Biomarkers in Subjects with Cancer   SECOND ELIGIBILITY CHECK  This Nurse has reviewed this patient's inclusion and exclusion criteria as a second review and confirms Christy Lynch is eligible for study participation.  Patient may continue with enrollment.  Wells Guiles 'Learta Codding' Neysa Bonito, RN, BSN, [facility] Clinical Research Nurse I 05/25/22 10:17 AM

## 2022-05-28 ENCOUNTER — Encounter: Payer: Self-pay | Admitting: Physical Therapy

## 2022-05-28 ENCOUNTER — Other Ambulatory Visit: Payer: Self-pay

## 2022-05-28 ENCOUNTER — Ambulatory Visit: Payer: Medicare Other | Attending: General Surgery | Admitting: Physical Therapy

## 2022-05-28 DIAGNOSIS — R293 Abnormal posture: Secondary | ICD-10-CM

## 2022-05-28 DIAGNOSIS — Z170 Estrogen receptor positive status [ER+]: Secondary | ICD-10-CM

## 2022-05-28 DIAGNOSIS — C50412 Malignant neoplasm of upper-outer quadrant of left female breast: Secondary | ICD-10-CM | POA: Diagnosis not present

## 2022-05-28 NOTE — Therapy (Signed)
OUTPATIENT PHYSICAL THERAPY BREAST CANCER BASELINE EVALUATION   Patient Name: Christy Lynch MRN: [id number] DOB:06/19/1955, 67 y.o., female Today's Date: 05/28/2022  END OF SESSION:  PT End of Session - 05/28/22 1434     Visit Number 1    Number of Visits 2    Date for PT Re-Evaluation 07/09/22    PT Start Time 1404    PT Stop Time 1431    PT Time Calculation (min) 27 min    Activity Tolerance Patient tolerated treatment well    Behavior During Therapy WFL for tasks assessed/performed             Past Medical History:  Diagnosis Date   Bursitis of hip    Esophageal reflux    Hypertension    Other and unspecified hyperlipidemia    Past Surgical History:  Procedure Laterality Date   ABDOMINAL HYSTERECTOMY     BREAST BIOPSY Left    neg bx early 2000's   BREAST BIOPSY Left 04/30/2022   [place] LT BREAST BX W LOC DEV 1ST LESION IMG BX SPEC US GUIDE 04/30/2022 [facility]   BREAST BIOPSY Left 04/30/2022   [place] LT BREAST BX W LOC DEV EA ADD LESION IMG BX SPEC US GUIDE 04/30/2022 [facility]   LEFT HEART CATH  12/12/2006   Patient Active Problem List   Diagnosis Date Noted   Malignant neoplasm of upper-outer quadrant of left breast in female, estrogen receptor positive ([place]) 05/22/2022   Unilateral primary osteoarthritis, left knee 10/14/2019   Vertigo 08/21/2013   Chest pain 07/02/2013   Essential hypertension 07/02/2013   Hyperlipidemia 07/02/2013    PCP: Glenda Chroman, MD  REFERRING PROVIDER: Stark Klein, MD  REFERRING DIAG: C50.412,Z17.0 (ICD-10-CM) - Malignant neoplasm of upper-outer quadrant of left breast in female, estrogen receptor positive ([place])   THERAPY DIAG:  Abnormal posture  Malignant neoplasm of upper-outer quadrant of left breast in female, estrogen receptor positive ([place])  Rationale for Evaluation and Treatment: Rehabilitation  ONSET DATE: 04/30/22  SUBJECTIVE:                                                                                                                                                                                            SUBJECTIVE STATEMENT: Patient reports she is here today to be seen by her medical team for her newly diagnosed left breast cancer.   PERTINENT HISTORY:  Patient was diagnosed on 04/30/22 with left grade 3. It measures 0.7 cm and the second mass is 0.5 cm and is located in the upper-outer quadrant. It is ER/PR+, HER2+ with a Ki67 of 60%.   PATIENT  GOALS:   reduce lymphedema risk and learn post op HEP.   PAIN:  Are you having pain? No  PRECAUTIONS: Active CA   HAND DOMINANCE: right  WEIGHT BEARING RESTRICTIONS: No  FALLS:  Has patient fallen in last 6 months? No  LIVING ENVIRONMENT: Patient lives with: husband Lives in: House/apartment Has following equipment at home: None  OCCUPATION: full time, Freight forwarder at [employer]: pt does not exercise  PRIOR LEVEL OF FUNCTION: Independent   OBJECTIVE:  COGNITION: Overall cognitive status: Within functional limits for tasks assessed    POSTURE:  Forward head and rounded shoulders posture  UPPER EXTREMITY AROM/PROM:  A/PROM RIGHT   eval   Shoulder extension 85  Shoulder flexion 167  Shoulder abduction 175  Shoulder internal rotation 65  Shoulder external rotation 85    (Blank rows = not tested)  A/PROM LEFT   eval  Shoulder extension 74  Shoulder flexion 169  Shoulder abduction 172  Shoulder internal rotation 61  Shoulder external rotation 84    (Blank rows = not tested)  CERVICAL AROM: All within normal limits:    Percent limited  Flexion WFL  Extension WFL  Right lateral flexion WFL  Left lateral flexion WFL  Right rotation WFL  Left rotation WFL    UPPER EXTREMITY STRENGTH: 5/5  LYMPHEDEMA ASSESSMENTS:   LANDMARK RIGHT   eval  10 cm proximal to olecranon process 34.1  Olecranon process 29  10 cm proximal to ulnar styloid process 23.3  Just proximal to ulnar styloid  process 18.1  Across hand at thumb web space 19.5  At base of 2nd digit 6.8  (Blank rows = not tested)  LANDMARK LEFT   eval  10 cm proximal to olecranon process 35  Olecranon process 28.6  10 cm proximal to ulnar styloid process 22.3  Just proximal to ulnar styloid process 17.5  Across hand at thumb web space 19.1  At base of 2nd digit 6.5  (Blank rows = not tested)  L-DEX LYMPHEDEMA SCREENING:  The patient was assessed using the L-Dex machine today to produce a lymphedema index baseline score. The patient will be reassessed on a regular basis (typically every 3 months) to obtain new L-Dex scores. If the score is > 6.5 points away from his/her baseline score indicating onset of subclinical lymphedema, it will be recommended to wear a compression garment for 4 weeks, 12 hours per day and then be reassessed. If the score continues to be > 6.5 points from baseline at reassessment, we will initiate lymphedema treatment. Assessing in this manner has a 95% rate of preventing clinically significant lymphedema.   L-DEX FLOWSHEETS - 05/28/22 1400       L-DEX LYMPHEDEMA SCREENING   Measurement Type Unilateral    L-DEX MEASUREMENT EXTREMITY Upper Extremity    POSITION  Standing    DOMINANT SIDE Right    At Risk Side Left    BASELINE SCORE (UNILATERAL) 1.7             QUICK DASH SURVEY:  [name] - 05/28/22 0001     Open a tight or new jar No difficulty    Do heavy household chores (wash walls, wash floors) No difficulty    Carry a shopping bag or briefcase No difficulty    Wash your back No difficulty    Use a knife to cut food No difficulty    Recreational activities in which you take some force or impact through your arm, shoulder, or hand (  golf, hammering, tennis) No difficulty    During the past week, to what extent has your arm, shoulder or hand problem interfered with your normal social activities with family, friends, neighbors, or groups? Not at all    During the past  week, to what extent has your arm, shoulder or hand problem limited your work or other regular daily activities Not at all    Arm, shoulder, or hand pain. None    Tingling (pins and needles) in your arm, shoulder, or hand None    Difficulty Sleeping No difficulty    DASH Score 0 %              PATIENT EDUCATION:  Education details: Lymphedema risk reduction and post op shoulder/posture HEP Person educated: Patient Education method: Explanation, Demonstration, Handout Education comprehension: Patient verbalized understanding and returned demonstration  HOME EXERCISE PROGRAM: Patient was instructed today in a home exercise program today for post op shoulder range of motion. These included active assist shoulder flexion in sitting, scapular retraction, wall walking with shoulder abduction, and hands behind head external rotation.  She was encouraged to do these twice a day, holding 3 seconds and repeating 5 times when permitted by her physician.   ASSESSMENT:  CLINICAL IMPRESSION: Pt reports to PT with recently diagnosed L breast cancer. The plan is to undergo a L breast lumpectomy and SLNB on 06/14/22 followed by adjuvant chemo with Taxol and herceptin and then radiation and anti estrogen therapy. At baseline her bilateral shoulder ROM is [facility]. She will benefit from a post op PT reassessment to determine needs and from L-Dex screens every 3 months for 2 years to detect subclinical lymphedema.  Pt will benefit from skilled therapeutic intervention to improve on the following deficits: Decreased knowledge of precautions, impaired UE functional use, pain, decreased ROM, postural dysfunction.   PT treatment/interventions: ADL/self-care home management, pt/family education, therapeutic exercise  REHAB POTENTIAL: Good  CLINICAL DECISION MAKING: Stable/uncomplicated  EVALUATION COMPLEXITY: Low   GOALS: Goals reviewed with patient? YES  LONG TERM GOALS: (STG=LTG)    Name Target Date  Goal status  1 Pt will be able to verbalize understanding of pertinent lymphedema risk reduction practices relevant to her dx specifically related to skin care.  Baseline:  No knowledge 05/28/2022 Achieved at eval  2 Pt will be able to return demo and/or verbalize understanding of the post op HEP related to regaining shoulder ROM. Baseline:  No knowledge 05/28/2022 Achieved at eval  3 Pt will be able to verbalize understanding of the importance of attending the post op After Breast CA Class for further lymphedema risk reduction education and therapeutic exercise.  Baseline:  No knowledge 05/28/2022 Achieved at eval  4 Pt will demo she has regained full shoulder ROM and function post operatively compared to baselines.  Baseline: See objective measurements taken today. 07/09/22     PLAN:  PT FREQUENCY/DURATION: EVAL and 1 follow up appointment.   PLAN FOR NEXT SESSION: will reassess 3-4 weeks post op to determine needs.   Patient will follow up at outpatient cancer rehab 3-4 weeks following surgery.  If the patient requires physical therapy at that time, a specific plan will be dictated and sent to the referring physician for approval. The patient was educated today on appropriate basic range of motion exercises to begin post operatively and the importance of attending the After Breast Cancer class following surgery.  Patient was educated today on lymphedema risk reduction practices as it pertains to recommendations that will  benefit the patient immediately following surgery.  She verbalized good understanding.    Physical Therapy Information for After Breast Cancer Surgery/Treatment:  Lymphedema is a swelling condition that you may be at risk for in your arm if you have lymph nodes removed from the armpit area.  After a sentinel node biopsy, the risk is approximately 5-9% and is higher after an axillary node dissection.  There is treatment available for this condition and it is not life-threatening.   Contact your physician or physical therapist with concerns. You may begin the 4 shoulder/posture exercises (see additional sheet) when permitted by your physician (typically a week after surgery).  If you have drains, you may need to wait until those are removed before beginning range of motion exercises.  A general recommendation is to not lift your arms above shoulder height until drains are removed.  These exercises should be done to your tolerance and gently.  This is not a "no pain/no gain" type of recovery so listen to your body and stretch into the range of motion that you can tolerate, stopping if you have pain.  If you are having immediate reconstruction, ask your plastic surgeon about doing exercises as he or she may want you to wait. We encourage you to attend the free one time ABC (After Breast Cancer) class offered by [facility].  You will learn information related to lymphedema risk, prevention and treatment and additional exercises to regain mobility following surgery.  You can call [phone] for more information.  This is offered the 1st and 3rd Monday of each month.  You only attend the class one time. While undergoing any medical procedure or treatment, try to avoid blood pressure being taken or needle sticks from occurring on the arm on the side of cancer.   This recommendation begins after surgery and continues for the rest of your life.  This may help reduce your risk of getting lymphedema (swelling in your arm). An excellent resource for those seeking information on lymphedema is the National Lymphedema Network's web site. It can be accessed at [place].org If you notice swelling in your hand, arm or breast at any time following surgery (even if it is many years from now), please contact your doctor or physical therapist to discuss this.  Lymphedema can be treated at any time but it is easier for you if it is treated early on.  If you feel like your  shoulder motion is not returning to normal in a reasonable amount of time, please contact your surgeon or physical therapist.  [facility] [phone]. [address], [address], [place] [address]  ABC CLASS After Breast Cancer Class  After Breast Cancer Class is a specially designed exercise class to assist you in a safe recover after having breast cancer surgery.  In this class you will learn how to get back to full function whether your drains were just removed or if you had surgery a month ago.  This one-time class is held the 1st and 3rd Monday of every month from 11:00 a.m. until 12:00 noon virtually.  This class is FREE and space is limited. For more information or to register for the next available class, call [phone].  Class Goals  Understand specific stretches to improve the flexibility of you chest and shoulder. Learn ways to safely strengthen your upper body and improve your posture. Understand the warning signs of infection and why you may be at risk for  an arm infection. Learn about Lymphedema and prevention.  ** You do not attend this class until after surgery.  Drains must be removed to participate  Patient was instructed today in a home exercise program today for post op shoulder range of motion. These included active assist shoulder flexion in sitting, scapular retraction, wall walking with shoulder abduction, and hands behind head external rotation.  She was encouraged to do these twice a day, holding 3 seconds and repeating 5 times when permitted by her physician.    [facility] [place], PT 05/28/2022, 2:36 PM

## 2022-05-30 ENCOUNTER — Telehealth: Payer: Self-pay | Admitting: *Deleted

## 2022-05-30 ENCOUNTER — Encounter: Payer: Self-pay | Admitting: *Deleted

## 2022-05-30 NOTE — Telephone Encounter (Signed)
Called pt to provide navigation resources and contact information. Left vm requesting call with questions or needs.

## 2022-05-31 ENCOUNTER — Telehealth: Payer: Self-pay | Admitting: Hematology and Oncology

## 2022-05-31 NOTE — Telephone Encounter (Signed)
Called patient to schedule appointment per 1/17 in basket. Left voicemail with new appointment information.

## 2022-06-05 ENCOUNTER — Other Ambulatory Visit: Payer: Medicare Other

## 2022-06-07 ENCOUNTER — Ambulatory Visit
Admission: RE | Admit: 2022-06-07 | Discharge: 2022-06-07 | Disposition: A | Discharge status: Home / Self Care | Payer: Medicare Other | Source: Ambulatory Visit | Attending: General Surgery | Admitting: General Surgery

## 2022-06-07 DIAGNOSIS — N6311 Unspecified lump in the right breast, upper outer quadrant: Secondary | ICD-10-CM | POA: Diagnosis not present

## 2022-06-07 DIAGNOSIS — Z170 Estrogen receptor positive status [ER+]: Secondary | ICD-10-CM

## 2022-06-07 MED ORDER — GADOPICLENOL 0.5 MMOL/ML IV SOLN: 6.0000 mL | Freq: Once | INTRAVENOUS | Status: DC | PRN

## 2022-06-07 MED ORDER — GADOPICLENOL 0.5 MMOL/ML IV SOLN
10.0000 mL | Freq: Once | INTRAVENOUS | Status: AC | PRN
  Administered 2022-06-07: 10 mL via INTRAVENOUS

## 2022-06-08 ENCOUNTER — Other Ambulatory Visit: Payer: Self-pay | Admitting: General Surgery

## 2022-06-08 ENCOUNTER — Encounter: Payer: Self-pay | Admitting: General Surgery

## 2022-06-08 DIAGNOSIS — C50412 Malignant neoplasm of upper-outer quadrant of left female breast: Secondary | ICD-10-CM

## 2022-06-08 NOTE — Pre-Procedure Instructions (Signed)
Surgical Instructions    Your procedure is scheduled on Thursday, June 14, 2022 at 11:00 AM,  .  Report to [facility] Main Entrance "A" at 9:00 A.M., then check in with the Admitting office.  Call this number if you have problems the morning of surgery:  [phone]   If you have any questions prior to your surgery date call [phone]: Open Monday-Friday 8am-4pm  *If you experience any cold or flu symptoms such as cough, fever, chills, shortness of breath, etc. between now and your scheduled surgery, please notify us.*    Remember:  Do not eat after midnight the night before your surgery  You may drink clear liquids until 8:00 AM the morning of your surgery.   Clear liquids allowed are: Water, Non-Citrus Juices (without pulp), Carbonated Beverages, Clear Tea, Black Coffee Only (NO MILK, CREAM OR POWDERED CREAMER of any kind), and Gatorade.    Take these medicines the morning of surgery with A SIP OF WATER:   NONE  Follow your surgeon's instructions on when to stop Aspirin.  If no instructions were given by your surgeon then you will need to call the office to get those instructions.     As of today, STOP taking any Aleve, Naproxen, Ibuprofen, Motrin, Advil, Goody's, BC's, all herbal medications, fish oil, and all vitamins.                     Do NOT Smoke (Tobacco/Vaping) for 24 hours prior to your procedure.  If you use a CPAP at night, you may bring your mask/headgear for your overnight stay.   Contacts, glasses, piercing's, hearing aid's, dentures or partials may not be worn into surgery, please bring cases for these belongings.    For patients admitted to the hospital, discharge time will be determined by your treatment team.   Patients discharged the day of surgery will not be allowed to drive home, and someone needs to stay with them for 24 hours.  SURGICAL WAITING ROOM VISITATION Patients having surgery or a procedure may have two support people in the waiting  area. Visitors may stay in the waiting area during the procedure and switch out with other visitors if needed. Only 1 support person is allowed in the pre-op area with the patient AFTER the patient is prepped. This person cannot be switched out. Children under the age of 75 must have an adult accompany them who is not the patient. If the patient needs to stay at the hospital during part of their recovery, the visitor guidelines for inpatient rooms apply.  Please refer to the [facility] website for the visitor guidelines for Inpatients (after your surgery is over and you are in a regular room).    Special instructions:   [facility]- Preparing For Surgery  Before surgery, you can play an important role. Because skin is not sterile, your skin needs to be as free of germs as possible. You can reduce the number of germs on your skin by washing with CHG (chlorahexidine gluconate) Soap before surgery.  CHG is an antiseptic cleaner which kills germs and bonds with the skin to continue killing germs even after washing.    Oral Hygiene is also important to reduce your risk of infection.  Remember - BRUSH YOUR TEETH THE MORNING OF SURGERY WITH YOUR REGULAR TOOTHPASTE  Please do not use if you have an allergy to CHG or antibacterial soaps. If your skin becomes reddened/irritated stop using the CHG.  Do not shave (  including legs and underarms) for at least 48 hours prior to first CHG shower. It is OK to shave your face.  Please follow these instructions carefully.   Shower the NIGHT BEFORE SURGERY and the MORNING OF SURGERY  If you chose to wash your hair, wash your hair first as usual with your normal shampoo.  After you shampoo, rinse your hair and body thoroughly to remove the shampoo.  Use CHG Soap as you would any other liquid soap. You can apply CHG directly to the skin and wash gently with a scrungie or a clean washcloth.   Apply the CHG Soap to your body ONLY FROM THE NECK DOWN.  Do not use  on open wounds or open sores. Avoid contact with your eyes, ears, mouth and genitals (private parts). Wash Face and genitals (private parts)  with your normal soap.   Wash thoroughly, paying special attention to the area where your surgery will be performed.  Thoroughly rinse your body with warm water from the neck down.  DO NOT shower/wash with your normal soap after using and rinsing off the CHG Soap.  Pat yourself dry with a CLEAN TOWEL.  Wear CLEAN PAJAMAS to bed the night before surgery  Place CLEAN SHEETS on your bed the night before your surgery  DO NOT SLEEP WITH PETS.   Day of Surgery: Take a shower with CHG soap. Do not wear jewelry or makeup Do not wear lotions, powders, perfumes/colognes, or deodorant. Do not shave 48 hours prior to surgery. Do not wear nail polish, gel polish, artificial nails, or any other type of covering on natural nails (fingers and toes) If you have artificial nails or gel coating that need to be removed by a nail salon, please have this removed prior to surgery. Artificial nails or gel coating may interfere with anesthesia's ability to adequately monitor your vital signs. Wear Clean/Comfortable clothing the morning of surgery Do not bring valuables to the hospital.  [facility] is not responsible for any belongings or valuables. Remember to brush your teeth WITH YOUR REGULAR TOOTHPASTE.   Please read over the following fact sheets that you were given.  If you received a COVID test during your pre-op visit  it is requested that you wear a mask when out in public, stay away from anyone that may not be feeling well and notify your surgeon if you develop symptoms. If you have been in contact with anyone that has tested positive in the last 10 days please notify you surgeon.

## 2022-06-11 ENCOUNTER — Other Ambulatory Visit: Payer: Self-pay

## 2022-06-11 ENCOUNTER — Encounter (HOSPITAL_COMMUNITY)
Admission: RE | Admit: 2022-06-11 | Discharge: 2022-06-11 | Disposition: A | Discharge status: Home / Self Care | Payer: Medicare Other | Source: Ambulatory Visit | Attending: General Surgery | Admitting: General Surgery

## 2022-06-11 ENCOUNTER — Encounter: Payer: Self-pay | Admitting: *Deleted

## 2022-06-11 ENCOUNTER — Encounter (HOSPITAL_COMMUNITY): Payer: Self-pay

## 2022-06-11 VITALS — BP 131/71 | HR 71 | Temp 97.6°F | Resp 18 | Ht 67.0 in | Wt 250.5 lb

## 2022-06-11 DIAGNOSIS — Z01818 Encounter for other preprocedural examination: Secondary | ICD-10-CM | POA: Insufficient documentation

## 2022-06-11 DIAGNOSIS — I10 Essential (primary) hypertension: Secondary | ICD-10-CM | POA: Diagnosis not present

## 2022-06-11 HISTORY — DX: Fibromyalgia: M79.7

## 2022-06-11 HISTORY — DX: Malignant (primary) neoplasm, unspecified: C80.1

## 2022-06-11 LAB — CBC
HCT: 37.1 % (ref 36.0–46.0)
Hemoglobin: 11.6 g/dL — ABNORMAL LOW (ref 12.0–15.0)
MCH: 27 pg (ref 26.0–34.0)
MCHC: 31.3 g/dL (ref 30.0–36.0)
MCV: 86.5 fL (ref 80.0–100.0)
Platelets: 319 10*3/uL (ref 150–400)
RBC: 4.29 MIL/uL (ref 3.87–5.11)
RDW: 14.5 % (ref 11.5–15.5)
WBC: 6.9 10*3/uL (ref 4.0–10.5)
nRBC: 0 % (ref 0.0–0.2)

## 2022-06-11 LAB — BASIC METABOLIC PANEL
Anion gap: 12 (ref 5–15)
BUN: 16 mg/dL (ref 8–23)
CO2: 25 mmol/L (ref 22–32)
Calcium: 9.7 mg/dL (ref 8.9–10.3)
Chloride: 100 mmol/L (ref 98–111)
Creatinine, Ser: 1.15 mg/dL — ABNORMAL HIGH (ref 0.44–1.00)
GFR, Estimated: 53 mL/min — ABNORMAL LOW (ref >60)
Glucose, Bld: 97 mg/dL (ref 70–99)
Potassium: 3.8 mmol/L (ref 3.5–5.1)
Sodium: 137 mmol/L (ref 135–145)

## 2022-06-11 NOTE — Progress Notes (Signed)
PCP - Dr. Jerene Bears Cardiologist - Denies Oncologist: Dr. Nicholas Lose  PPM/ICD - Denies  Chest x-ray - N/A EKG - 06/01/22 Stress Test - 12/06/20 ECHO - Denies Cardiac Cath - Denies  Sleep Study - Denies  Diabetes: Denies  Blood Thinner Instructions: N/A Aspirin Instructions: LD: 04/30/22  ERAS Protcol - Yes PRE-SURGERY Ensure or G2- No  COVID TEST- N/A   Anesthesia review: Yes, seed placement; abnormal EKG  Patient denies shortness of breath, fever, cough and chest pain at PAT appointment   All instructions explained to the patient, with a verbal understanding of the material. Patient agrees to go over the instructions while at home for a better understanding. Patient also instructed to self quarantine after being tested for COVID-19. The opportunity to ask questions was provided.

## 2022-06-12 ENCOUNTER — Encounter: Payer: Self-pay | Admitting: Hematology and Oncology

## 2022-06-14 ENCOUNTER — Encounter (HOSPITAL_COMMUNITY): Admission: RE | Payer: Self-pay | Source: Ambulatory Visit

## 2022-06-14 ENCOUNTER — Telehealth: Payer: Self-pay | Admitting: *Deleted

## 2022-06-14 ENCOUNTER — Ambulatory Visit (HOSPITAL_COMMUNITY): Admission: RE | Admit: 2022-06-14 | Payer: Medicare Other | Source: Ambulatory Visit | Admitting: General Surgery

## 2022-06-14 SURGERY — BREAST LUMPECTOMY WITH RADIOACTIVE SEED AND SENTINEL LYMPH NODE BIOPSY
Anesthesia: General | Site: Breast

## 2022-06-14 NOTE — Telephone Encounter (Signed)
Spoke with patient to explain the reasoning for delay of sx.  Explained the add bx's need to be done and could possibly change the type of sx. Patient verbalized understanding.

## 2022-06-15 DIAGNOSIS — C50412 Malignant neoplasm of upper-outer quadrant of left female breast: Secondary | ICD-10-CM | POA: Diagnosis not present

## 2022-06-20 ENCOUNTER — Ambulatory Visit
Admission: RE | Admit: 2022-06-20 | Discharge: 2022-06-20 | Disposition: A | Discharge status: Home / Self Care | Payer: Medicare Other | Source: Ambulatory Visit | Attending: General Surgery | Admitting: General Surgery

## 2022-06-20 DIAGNOSIS — N6012 Diffuse cystic mastopathy of left breast: Secondary | ICD-10-CM | POA: Diagnosis not present

## 2022-06-20 DIAGNOSIS — N6011 Diffuse cystic mastopathy of right breast: Secondary | ICD-10-CM | POA: Diagnosis not present

## 2022-06-20 DIAGNOSIS — R928 Other abnormal and inconclusive findings on diagnostic imaging of breast: Secondary | ICD-10-CM | POA: Diagnosis not present

## 2022-06-20 DIAGNOSIS — C50411 Malignant neoplasm of upper-outer quadrant of right female breast: Secondary | ICD-10-CM | POA: Diagnosis not present

## 2022-06-20 DIAGNOSIS — C50412 Malignant neoplasm of upper-outer quadrant of left female breast: Secondary | ICD-10-CM

## 2022-06-20 DIAGNOSIS — N6311 Unspecified lump in the right breast, upper outer quadrant: Secondary | ICD-10-CM | POA: Diagnosis not present

## 2022-06-20 DIAGNOSIS — Z171 Estrogen receptor negative status [ER-]: Secondary | ICD-10-CM

## 2022-06-20 DIAGNOSIS — C50912 Malignant neoplasm of unspecified site of left female breast: Secondary | ICD-10-CM | POA: Diagnosis not present

## 2022-06-20 MED ORDER — GADOPICLENOL 0.5 MMOL/ML IV SOLN
10.0000 mL | Freq: Once | INTRAVENOUS | Status: AC | PRN
  Administered 2022-06-20: 10 mL via INTRAVENOUS

## 2022-06-22 ENCOUNTER — Other Ambulatory Visit: Payer: Self-pay | Admitting: General Surgery

## 2022-06-22 DIAGNOSIS — Z170 Estrogen receptor positive status [ER+]: Secondary | ICD-10-CM

## 2022-06-22 DIAGNOSIS — C50412 Malignant neoplasm of upper-outer quadrant of left female breast: Secondary | ICD-10-CM | POA: Diagnosis not present

## 2022-06-22 DIAGNOSIS — C50411 Malignant neoplasm of upper-outer quadrant of right female breast: Secondary | ICD-10-CM | POA: Diagnosis not present

## 2022-06-22 DIAGNOSIS — N6092 Unspecified benign mammary dysplasia of left breast: Secondary | ICD-10-CM | POA: Diagnosis not present

## 2022-06-25 ENCOUNTER — Ambulatory Visit: Payer: Medicare Other | Admitting: Hematology and Oncology

## 2022-06-26 ENCOUNTER — Other Ambulatory Visit: Payer: Self-pay | Admitting: General Surgery

## 2022-06-26 DIAGNOSIS — Z170 Estrogen receptor positive status [ER+]: Secondary | ICD-10-CM

## 2022-06-27 ENCOUNTER — Telehealth: Payer: Self-pay | Admitting: Hematology and Oncology

## 2022-06-27 ENCOUNTER — Encounter: Payer: Self-pay | Admitting: *Deleted

## 2022-06-27 NOTE — Telephone Encounter (Signed)
Per 2/24 IB reached out to patient to schedule appointment, left voicemail will call patient back at a later time.

## 2022-06-28 ENCOUNTER — Encounter: Payer: Self-pay | Admitting: Hematology and Oncology

## 2022-07-03 ENCOUNTER — Encounter: Payer: Self-pay | Admitting: Hematology and Oncology

## 2022-07-09 ENCOUNTER — Ambulatory Visit: Payer: Medicare Other | Attending: General Surgery | Admitting: Physical Therapy

## 2022-07-09 DIAGNOSIS — Z170 Estrogen receptor positive status [ER+]: Secondary | ICD-10-CM

## 2022-07-09 DIAGNOSIS — C50412 Malignant neoplasm of upper-outer quadrant of left female breast: Secondary | ICD-10-CM | POA: Insufficient documentation

## 2022-07-09 NOTE — Therapy (Signed)
  OUTPATIENT PHYSICAL THERAPY BREAST CANCER POST OP FOLLOW UP   Patient Name: Christy Lynch MRN: [id number] DOB:01/28/1956, 67 y.o., female Today's Date: 07/09/2022  END OF SESSION:   Past Medical History:  Diagnosis Date   Breast Cancer    Left   Bursitis of hip    Esophageal reflux    Fibromyalgia    Hypertension    Other and unspecified hyperlipidemia    Past Surgical History:  Procedure Laterality Date   ABDOMINAL HYSTERECTOMY     BREAST BIOPSY Left    neg bx early 2000's   BREAST BIOPSY Left 04/30/2022   [place] LT BREAST BX W LOC DEV 1ST LESION IMG BX SPEC US GUIDE 04/30/2022 [facility]   BREAST BIOPSY Left 04/30/2022   [place] LT BREAST BX W LOC DEV EA ADD LESION IMG BX SPEC US GUIDE 04/30/2022 [facility]   LEFT HEART CATH  12/12/2006   Patient Active Problem List   Diagnosis Date Noted   Malignant neoplasm of upper-outer quadrant of left breast in female, estrogen receptor positive ([place]) 05/22/2022   Unilateral primary osteoarthritis, left knee 10/14/2019   Vertigo 08/21/2013   Chest pain 07/02/2013   Essential hypertension 07/02/2013   Hyperlipidemia 07/02/2013    PCP: Glenda Chroman, MD  REFERRING PROVIDER: Stark Klein, MD  REFERRING DIAG: C50.412,Z17.0 (ICD-10-CM) - Malignant neoplasm of upper-outer quadrant of left breast in female, estrogen receptor positive ([place])   THERAPY DIAG:  No diagnosis found.  Rationale for Evaluation and Treatment: Rehabilitation  ONSET DATE: 04/30/22  Pt arrived - no charge because her surgery was rescheduled and she has not had surgery. It was rescheduled due to now needing bilateral lumpectomies due to bilateral breast cancer. It was rescheduled to 07/19/22 and this appt was rescheduled for 3 weeks after that.   [facility] [place], PT 07/09/2022, 8:55 AM

## 2022-07-12 ENCOUNTER — Encounter: Payer: Self-pay | Admitting: Radiology

## 2022-07-12 ENCOUNTER — Other Ambulatory Visit: Payer: Self-pay

## 2022-07-12 ENCOUNTER — Encounter (HOSPITAL_BASED_OUTPATIENT_CLINIC_OR_DEPARTMENT_OTHER): Payer: Self-pay | Admitting: General Surgery

## 2022-07-13 ENCOUNTER — Encounter (HOSPITAL_BASED_OUTPATIENT_CLINIC_OR_DEPARTMENT_OTHER)
Admission: RE | Admit: 2022-07-13 | Discharge: 2022-07-13 | Disposition: A | Discharge status: Home / Self Care | Payer: Medicare Other | Source: Ambulatory Visit | Attending: General Surgery | Admitting: General Surgery

## 2022-07-13 DIAGNOSIS — Z79899 Other long term (current) drug therapy: Secondary | ICD-10-CM | POA: Diagnosis not present

## 2022-07-13 DIAGNOSIS — Z01812 Encounter for preprocedural laboratory examination: Secondary | ICD-10-CM | POA: Insufficient documentation

## 2022-07-13 LAB — BASIC METABOLIC PANEL
Anion gap: 8 (ref 5–15)
BUN: 20 mg/dL (ref 8–23)
CO2: 25 mmol/L (ref 22–32)
Calcium: 9.4 mg/dL (ref 8.9–10.3)
Chloride: 103 mmol/L (ref 98–111)
Creatinine, Ser: 1.15 mg/dL — ABNORMAL HIGH (ref 0.44–1.00)
GFR, Estimated: 52 mL/min — ABNORMAL LOW (ref >60)
Glucose, Bld: 112 mg/dL — ABNORMAL HIGH (ref 70–99)
Potassium: 3.6 mmol/L (ref 3.5–5.1)
Sodium: 136 mmol/L (ref 135–145)

## 2022-07-13 NOTE — Progress Notes (Signed)

## 2022-07-17 ENCOUNTER — Ambulatory Visit
Admission: RE | Admit: 2022-07-17 | Discharge: 2022-07-17 | Disposition: A | Discharge status: Home / Self Care | Payer: Medicare Other | Source: Ambulatory Visit | Attending: General Surgery | Admitting: General Surgery

## 2022-07-17 DIAGNOSIS — C50911 Malignant neoplasm of unspecified site of right female breast: Secondary | ICD-10-CM | POA: Diagnosis not present

## 2022-07-17 DIAGNOSIS — C50912 Malignant neoplasm of unspecified site of left female breast: Secondary | ICD-10-CM | POA: Diagnosis not present

## 2022-07-17 DIAGNOSIS — Z170 Estrogen receptor positive status [ER+]: Secondary | ICD-10-CM

## 2022-07-17 DIAGNOSIS — R928 Other abnormal and inconclusive findings on diagnostic imaging of breast: Secondary | ICD-10-CM | POA: Diagnosis not present

## 2022-07-17 HISTORY — PX: BREAST BIOPSY: SHX20

## 2022-07-17 NOTE — H&P (Signed)
PROVIDER:  Georgianne Fick, MD Patient Care Team: Glenda Chroman, MD as PCP - General (Internal Medicine) Georgianne Fick, MD as Consulting Provider (Surgical Oncology) Rulon Eisenmenger, MD (Hematology and Oncology)   MRN: [id number] DOB: 1955/11/23 DATE OF ENCOUNTER: 06/22/2022    Chief Complaint: Follow-up (RE-CHECK - discuss breast sx/)   History of Present Illness: Christy Lynch is a 67 y.o. female who is seen today for breast cancer follow up.   Initial history:   Pt presented with new diagnosis of left breast cancer 04/2022.  She had recall for screening detected mass.  Diagnostic imaging was performed.  She had two adjacent masses at 2 o'clock with the larger 7-8 mm and the smaller 5 mm.  Biopsies were performed.  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  She has had a biopsy before, but not cancer.     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      Interval history:    Pt underwent MRI and had several additional findings.  There was a 9 mm area in the upper outer quadrant that was invasive mammary carcinoma (prognostics pending) and a 1.4 cm upper central region that was benign.  There was a 3 cm region of NME in the retroareolar region on the left that was ADH with excision requested.   ] She presents to discuss.  She has seen Dr. Lindi Adie and is going to get chemo.     Review of Systems: A complete review of systems was obtained from the patient.  I have reviewed this information and discussed as appropriate with the patient.  See HPI as well for other ROS.   Review of Systems  All other systems reviewed and are negative.       Medical History: Past Medical History      Past Medical History:  Diagnosis Date   GERD (gastroesophageal reflux disease)     Hypertension             Patient Active Problem List  Diagnosis    Malignant neoplasm of upper-outer quadrant of left breast in female, estrogen receptor positive    Family history of cancer   Breast cancer of upper-outer quadrant of right female breast (CMS-HCC)   Atypical ductal hyperplasia of left breast      Past Surgical History       Past Surgical History:  Procedure Laterality Date   HYSTERECTOMY            Allergies       Allergies  Allergen Reactions   Metronidazole Nausea And Vomiting and Other (See Comments)      headache              Current Outpatient Medications on File Prior to Visit  Medication Sig Dispense Refill   atenoloL (TENORMIN) 50 MG tablet Take 50 mg by mouth once daily       diazePAM (VALIUM) 5 MG tablet Take 1 tablet (5 mg total) by mouth once daily as needed for Anxiety (prior to procedure) for up to 2 doses 2 tablet 0   DULoxetine (CYMBALTA) 60 MG DR capsule Take 60 mg by mouth at bedtime       esomeprazole (NEXIUM) 40 MG DR capsule Take 40 mg by mouth once daily       lisinopriL-hydroCHLOROthiazide (ZESTORETIC) 20-25 mg tablet Take  1 tablet by mouth once daily        No current facility-administered medications on file prior to visit.      Family History       Family History  Problem Relation Age of Onset   Breast cancer Mother     High blood pressure (Hypertension) Brother          Social History        Tobacco Use  Smoking Status Former   Types: Cigarettes   Quit date: 1996   Years since quitting: 28.1  Smokeless Tobacco Never      Social History  Social History         Socioeconomic History   Marital status: Married  Tobacco Use   Smoking status: Former      Types: Cigarettes      Quit date: 1996      Years since quitting: 28.1   Smokeless tobacco: Never  Substance and Sexual Activity   Alcohol use: Never   Drug use: Never        Objective:         Vitals:    06/22/22 1040  PainSc:   6    There is no height or weight on file to calculate BMI.   Head:   Normocephalic  and atraumatic.  Eyes:    Conjunctivae are normal. Pupils are equal, round, and reactive to light. No scleral icterus.  Neck:   Normal range of motion. Neck supple. No tracheal deviation present. No thyromegaly present.  Resp:   No respiratory distress, normal effort. Breast: no significant bruising.  Neurological: Alert and oriented to person, place, and time. Coordination normal.  Skin:    Skin is warm and dry. No rash noted. No diaphoretic. No erythema. No pallor.   Psychiatric: Normal mood and affect. Normal behavior. Judgment and thought content normal.      Labs, Imaging and Diagnostic Testing:   See above.     Assessment and Plan:     Diagnoses and all orders for this visit:   Malignant neoplasm of upper-outer quadrant of right female breast, unspecified estrogen receptor status (CMS-HCC)   Malignant neoplasm of upper-outer quadrant of left breast in female, estrogen receptor positive    Atypical ductal hyperplasia of left breast   New diagnosis of right breast cancer in addition to the left breast cancer that we already knew about.  Both of these appear to be clinical T1b N0.  The left side is triple positive.   There is an additional lesion in the retroareolar region on the left that is atypical ductal hyperplasia.   I will plan a left sided seed localized lumpectomy x 2 and sentinel lymph node biopsy, right breast seed localized lumpectomy with sentinel lymph node biopsy, and port placement.  I reviewed the procedure as well as risks with the patient and her spouse.   The surgical procedure was described to the patient.  I discussed the incision type and location and that we will need radiology involved with seed markers the day before surgery.   We discussed the risks bleeding, infection, damage to other structures, need for further procedures/surgeries.  We discussed the risk of seroma.  The patient was advised if the breast has cancer, we may need to go back to surgery  for additional tissue to obtain negative margins or for a lymph node biopsy. The patient was advised that these are the most common complications, but that others can occur as well.  I discussed the risk of alteration in breast contour or size.  I discussed risk of chronic pain.  There are rare instances of heart/lung issues post op as well as blood clots.      They were advised against taking aspirin or other anti-inflammatory agents/blood thinners the week before surgery.

## 2022-07-18 ENCOUNTER — Encounter: Payer: Self-pay | Admitting: Genetic Counselor

## 2022-07-18 ENCOUNTER — Other Ambulatory Visit: Payer: Self-pay

## 2022-07-18 ENCOUNTER — Inpatient Hospital Stay: Payer: Medicare Other

## 2022-07-18 ENCOUNTER — Inpatient Hospital Stay: Payer: Medicare Other | Attending: Hematology and Oncology | Admitting: Genetic Counselor

## 2022-07-18 DIAGNOSIS — Z8051 Family history of malignant neoplasm of kidney: Secondary | ICD-10-CM

## 2022-07-18 DIAGNOSIS — C50412 Malignant neoplasm of upper-outer quadrant of left female breast: Secondary | ICD-10-CM | POA: Diagnosis not present

## 2022-07-18 DIAGNOSIS — Z800 Family history of malignant neoplasm of digestive organs: Secondary | ICD-10-CM | POA: Diagnosis not present

## 2022-07-18 DIAGNOSIS — Z803 Family history of malignant neoplasm of breast: Secondary | ICD-10-CM | POA: Diagnosis not present

## 2022-07-18 DIAGNOSIS — Z170 Estrogen receptor positive status [ER+]: Secondary | ICD-10-CM

## 2022-07-18 DIAGNOSIS — Z79899 Other long term (current) drug therapy: Secondary | ICD-10-CM | POA: Insufficient documentation

## 2022-07-18 DIAGNOSIS — Z881 Allergy status to other antibiotic agents status: Secondary | ICD-10-CM | POA: Insufficient documentation

## 2022-07-18 LAB — GENETIC SCREENING ORDER

## 2022-07-18 NOTE — Anesthesia Preprocedure Evaluation (Addendum)
Anesthesia Evaluation  Patient identified by MRN, date of birth, ID band Patient awake    Reviewed: Allergy & Precautions, NPO status , Patient's Chart, lab work & pertinent test results  Airway Mallampati: II  TM Distance: >3 FB Neck ROM: Full    Dental no notable dental hx. (+) Dental Advisory Given   Pulmonary former smoker   Pulmonary exam normal breath sounds clear to auscultation       Cardiovascular hypertension, Pt. on home beta blockers and Pt. on medications Normal cardiovascular exam Rhythm:Regular Rate:Normal     Neuro/Psych negative neurological ROS     GI/Hepatic Neg liver ROS,GERD  ,,  Endo/Other    Morbid obesity  Renal/GU negative Renal ROS     Musculoskeletal  (+) Arthritis ,  Fibromyalgia -  Abdominal  (+) + obese  Peds  Hematology negative hematology ROS (+)   Anesthesia Other Findings   Reproductive/Obstetrics                             Anesthesia Physical Anesthesia Plan  ASA: 3  Anesthesia Plan: General   Post-op Pain Management: Tylenol PO (pre-op)* and Regional block*   Induction: Intravenous  PONV Risk Score and Plan: 4 or greater and Ondansetron, Dexamethasone, Treatment may vary due to age or medical condition and Midazolam  Airway Management Planned: Oral ETT  Additional Equipment:   Intra-op Plan:   Post-operative Plan: Extubation in OR  Informed Consent: I have reviewed the patients History and Physical, chart, labs and discussed the procedure including the risks, benefits and alternatives for the proposed anesthesia with the patient or authorized representative who has indicated his/her understanding and acceptance.     Dental advisory given  Plan Discussed with: CRNA  Anesthesia Plan Comments:         Anesthesia Quick Evaluation

## 2022-07-18 NOTE — Progress Notes (Addendum)
REFERRING PROVIDER: [name], MD [address] [place],  [name] [address]  PRIMARY PROVIDER:  [name], MD  PRIMARY REASON FOR VISIT:  1. Family history of breast cancer   2. Family history of colon cancer   3. Family history of kidney cancer   4. Malignant neoplasm of upper-outer quadrant of left breast in female, estrogen receptor positive (HCC)      HISTORY OF PRESENT ILLNESS:   Christy Lynch, a 67 y.o. female, was seen for a [place] cancer genetics consultation at the request of Dr. Pamelia Hoit due to a personal and family history of breast cancer.  Christy Lynch presents to clinic today to discuss the possibility of a hereditary predisposition to cancer, genetic testing, and to further clarify her future cancer risks, as well as potential cancer risks for family members.   In 2023, at the age of 69, Christy Lynch was diagnosed with invasive ductal carcinoma of the left breast.      CANCER HISTORY:  Oncology History  Malignant neoplasm of upper-outer quadrant of left breast in female, estrogen receptor positive (HCC)  04/30/2022 Initial Diagnosis   Screening mammogram to treat left breast mass measuring 0.7 cm, adjacent mass 0.5 cm, biopsy of both revealed grade 3 IDC with DCIS ER 100% PR 100% Ki-67 60%, HER2 3+ positive   05/25/2022 Cancer Staging   Staging form: Breast, AJCC 8th Edition - Clinical: Stage IA (cT1b, cN0, cM0, G3, ER+, PR+, HER2+) - Signed by [name], MD on 05/25/2022 Stage prefix: Initial diagnosis Histologic grading system: 3 grade system      RISK FACTORS:  Menarche was at age 50.  First live birth at age 2.  OCP use for approximately 0 years.  Ovaries intact: one ovary.  Hysterectomy: yes.  Menopausal status: postmenopausal.  HRT use: 0 years. Colonoscopy: yes; normal. Mammogram within the last year: yes. Number of breast biopsies: 2. Up to date with pelvic exams: n/a. Any excessive radiation exposure in the past:  no  Past Medical History:  Diagnosis Date   Breast Cancer    Left   Bursitis of hip    Esophageal reflux    Family history of breast cancer    Family history of colon cancer    Family history of kidney cancer    Fibromyalgia    Hypertension    Other and unspecified hyperlipidemia     Past Surgical History:  Procedure Laterality Date   ABDOMINAL HYSTERECTOMY     BREAST BIOPSY Left    neg bx early 2000's   BREAST BIOPSY Left 04/30/2022   [place] LT BREAST BX W LOC DEV 1ST LESION IMG BX SPEC US GUIDE 04/30/2022 [facility]   BREAST BIOPSY Left 04/30/2022   [place] LT BREAST BX W LOC DEV EA ADD LESION IMG BX SPEC US GUIDE 04/30/2022 [facility]   BREAST BIOPSY  07/17/2022   MM LT RADIOACTIVE SEED LOC MAMMO GUIDE 07/17/2022 [facility]   BREAST BIOPSY  07/17/2022   MM LT RADIOACTIVE SEED EA ADD LESION LOC MAMMO GUIDE 07/17/2022 [facility]   BREAST BIOPSY  07/17/2022   MM RT RADIOACTIVE SEED LOC MAMMO GUIDE 07/17/2022 [facility]   LEFT HEART CATH  12/12/2006    Social History   Socioeconomic History   Marital status: Married    Spouse name: Not on file   Number of children: Not on file   Years of education: Not on file   Highest education level: Not on file  Occupational  History   Not on file  Tobacco Use   Smoking status: Former   Smokeless tobacco: Never  Vaping Use   Vaping Use: Never used  Substance and Sexual Activity   Alcohol use: No   Drug use: No   Sexual activity: Not on file  Other Topics Concern   Not on file  Social History Narrative   Not on file   Social Determinants of Health   Financial Resource Strain: Not on file  Food Insecurity: Not on file  Transportation Needs: Not on file  Physical Activity: Not on file  Stress: Not on file  Social Connections: Not on file     FAMILY HISTORY:  We obtained a detailed, 4-generation family history.  Significant diagnoses are listed below: Family History  Problem Relation Age of Onset    Breast cancer Mother 2   Hypertension Mother    Stroke Mother    Kidney disease Father    Kidney cancer Brother 66   Colon cancer Maternal Uncle 68   Breast cancer Cousin        maternal first cousin, dx 30s     The patient has two sons who are cancer free.  She ha four sisters and three brothers.  One brother died of kidney cancer at 56. Both of her parents are deceased.  The patient's mother had breast cancer at 26 and died at 79.  She had five siblings, one had colon cancer at 45. This brother has a daughter who had breast cancer in her 47's.  The maternal grandparents.  The patient's father died at 67.  He had two brothers and a sister who were cancer free.  The paternal grandparents.  Christy Lynch is unaware of previous family history of genetic testing for hereditary cancer risks. Patient's maternal ancestors are of African American descent, and paternal ancestors are of African American descent. There is no reported Ashkenazi Jewish ancestry. There is no known consanguinity.  GENETIC COUNSELING ASSESSMENT: Christy Lynch is a 67 y.o. female with a personal and family history of breast cancer which is somewhat suggestive of a hereditary cancer syndrome and predisposition to cancer given the number of women in the family with breast cancer and the young age of onset. We, therefore, discussed and recommended the following at today's visit.   DISCUSSION: We discussed that, in general, most cancer is not inherited in families, but instead is sporadic or familial. Sporadic cancers occur by chance and typically happen at older ages (>50 years) as this type of cancer is caused by genetic changes acquired during an individual's lifetime. Some families have more cancers than would be expected by chance; however, the ages or types of cancer are not consistent with a known genetic mutation or known genetic mutations have been ruled out. This type of familial cancer is thought to be due to a  combination of multiple genetic, environmental, hormonal, and lifestyle factors. While this combination of factors likely increases the risk of cancer, the exact source of this risk is not currently identifiable or testable.  We discussed that 5 - 10% of breast cancer is hereditary, with most cases associated with BRCA mutations.  There are other genes that can be associated with hereditary breast cancer syndromes.  These include ATM, CHEK2 and PALB2.  We discussed that testing is beneficial for several reasons including knowing how to follow individuals after completing their treatment, identifying whether potential treatment options such as PARP inhibitors would be beneficial, and understand if other family  members could be at risk for cancer and allow them to undergo genetic testing.   We reviewed the characteristics, features and inheritance patterns of hereditary cancer syndromes. We also discussed genetic testing, including the appropriate family members to test, the process of testing, insurance coverage and turn-around-time for results. We discussed the implications of a negative, positive, carrier and/or variant of uncertain significant result. Christy Lynch  was offered a common hereditary cancer panel (47 genes) and an expanded pan-cancer panel (77 genes). Christy Lynch was informed of the benefits and limitations of each panel, including that expanded pan-cancer panels contain genes that do not have clear management guidelines at this point in time.  We also discussed that as the number of genes included on a panel increases, the chances of variants of uncertain significance increases. Christy Lynch decided to pursue genetic testing for the CancerNext-Expanded+RNAinsight gene panel.   The CancerNext-Expanded gene panel offered by [employer] and includes sequencing and rearrangement analysis for the following 71 genes: AIP, ALK, APC, ATM, BAP1, BARD1, BMPR1A, BRCA1, BRCA2, BRIP1, CDC73, CDH1,  CDK4, CDKN1B, CDKN2A, CHEK2, DICER1, FH, FLCN, KIF1B, LZTR1, MAX, MEN1, MET, MLH1, MSH2, MSH6, MUTYH, NF1, NF2, NTHL1, PALB2, PHOX2B, PMS2, POT1, PRKAR1A, PTCH1, PTEN, RAD51C, RAD51D, RB1, RET, SDHA, SDHAF2, SDHB, SDHC, SDHD, SMAD4, SMARCA4, SMARCB1, SMARCE1, STK11, SUFU, TMEM127, TP53, TSC1, TSC2 and VHL (sequencing and deletion/duplication); AXIN2, CTNNA1, EGFR, EGLN1, HOXB13, KIT, MITF, MSH3, PDGFRA, POLD1 and POLE (sequencing only); EPCAM and GREM1 (deletion/duplication only). RNA data is routinely analyzed for use in variant interpretation for all genes.   Based on Christy Lynch's personal and family history of cancer, she meets medical criteria for genetic testing. Despite that she meets criteria, she may still have an out of pocket cost. We discussed that if her out of pocket cost for testing is over $100, the laboratory will call and confirm whether she wants to proceed with testing.  If the out of pocket cost of testing is less than $100 she will be billed by the genetic testing laboratory.   We discussed that some people do not want to undergo genetic testing due to fear of genetic discrimination.  The Genetic Information Nondiscrimination Act (GINA) was signed into federal law in 2008. GINA prohibits health insurers and most employers from discriminating against individuals based on genetic information (including the results of genetic tests and family history information). According to GINA, health insurance companies cannot consider genetic information to be a preexisting condition, nor can they use it to make decisions regarding coverage or rates. GINA also makes it illegal for most employers to use genetic information in making decisions about hiring, firing, promotion, or terms of employment. It is important to note that GINA does not offer protections for life insurance, disability insurance, or long-term care insurance. GINA does not apply to those in the [employer], those who work for companies  with less than 15 employees, and new life insurance or long-term disability insurance policies.  Health status due to a cancer diagnosis is not protected under GINA. More information about GINA can be found by visiting [email or web address].   PLAN: After considering the risks, benefits, and limitations, Christy Lynch provided informed consent to pursue genetic testing and the blood sample was sent to [facility] for analysis of the CancerNext-Expanded+RNAinsight. Results should be available within approximately 2-3 weeks' time, at which point they will be disclosed by telephone to Christy Lynch, as will any additional recommendations warranted by these results. Christy Lynch will receive a summary of  her genetic counseling visit and a copy of her results once available. This information will also be available in Epic.   Lastly, we encouraged Christy Lynch to remain in contact with cancer genetics annually so that we can continuously update the family history and inform her of any changes in cancer genetics and testing that may be of benefit for this family.   Christy Lynch questions were answered to her satisfaction today. Our contact information was provided should additional questions or concerns arise. Thank you for the referral and allowing [place] to share in the care of your patient.   Burnett Spray P. Lowell Guitar, MS, [facility] Licensed, Patent attorney [email or web address] phone: [phone]  The patient was seen for a total of 25 minutes in face-to-face genetic counseling.  The patient was seen alone.  Drs. Meliton Rattan, and/or [place] were available for questions, if needed..    _______________________________________________________________________ For Office Staff:  Number of people involved in session: 1 Was an Intern/ student involved with case: no

## 2022-07-19 ENCOUNTER — Ambulatory Visit
Admission: RE | Admit: 2022-07-19 | Discharge: 2022-07-19 | Disposition: A | Discharge status: Home / Self Care | Payer: Medicare Other | Source: Ambulatory Visit | Attending: General Surgery | Admitting: General Surgery

## 2022-07-19 ENCOUNTER — Ambulatory Visit (HOSPITAL_COMMUNITY): Payer: Medicare Other

## 2022-07-19 ENCOUNTER — Encounter (HOSPITAL_BASED_OUTPATIENT_CLINIC_OR_DEPARTMENT_OTHER)
Admission: RE | Disposition: A | Discharge status: Home / Self Care | Payer: Self-pay | Source: Ambulatory Visit | Attending: General Surgery

## 2022-07-19 ENCOUNTER — Ambulatory Visit (HOSPITAL_BASED_OUTPATIENT_CLINIC_OR_DEPARTMENT_OTHER): Payer: Medicare Other | Admitting: Anesthesiology

## 2022-07-19 ENCOUNTER — Encounter (HOSPITAL_BASED_OUTPATIENT_CLINIC_OR_DEPARTMENT_OTHER): Payer: Self-pay | Admitting: General Surgery

## 2022-07-19 ENCOUNTER — Ambulatory Visit (HOSPITAL_BASED_OUTPATIENT_CLINIC_OR_DEPARTMENT_OTHER)
Admission: RE | Admit: 2022-07-19 | Discharge: 2022-07-19 | Disposition: A | Discharge status: Home / Self Care | Payer: Medicare Other | Source: Ambulatory Visit | Attending: General Surgery | Admitting: General Surgery

## 2022-07-19 ENCOUNTER — Other Ambulatory Visit: Payer: Self-pay

## 2022-07-19 DIAGNOSIS — Z170 Estrogen receptor positive status [ER+]: Secondary | ICD-10-CM

## 2022-07-19 DIAGNOSIS — Z87891 Personal history of nicotine dependence: Secondary | ICD-10-CM

## 2022-07-19 DIAGNOSIS — G8918 Other acute postprocedural pain: Secondary | ICD-10-CM | POA: Diagnosis not present

## 2022-07-19 DIAGNOSIS — C50412 Malignant neoplasm of upper-outer quadrant of left female breast: Secondary | ICD-10-CM | POA: Insufficient documentation

## 2022-07-19 DIAGNOSIS — C50411 Malignant neoplasm of upper-outer quadrant of right female breast: Secondary | ICD-10-CM

## 2022-07-19 DIAGNOSIS — R928 Other abnormal and inconclusive findings on diagnostic imaging of breast: Secondary | ICD-10-CM | POA: Diagnosis not present

## 2022-07-19 DIAGNOSIS — I10 Essential (primary) hypertension: Secondary | ICD-10-CM

## 2022-07-19 DIAGNOSIS — C50911 Malignant neoplasm of unspecified site of right female breast: Secondary | ICD-10-CM | POA: Diagnosis not present

## 2022-07-19 DIAGNOSIS — Z800 Family history of malignant neoplasm of digestive organs: Secondary | ICD-10-CM | POA: Diagnosis not present

## 2022-07-19 DIAGNOSIS — N6092 Unspecified benign mammary dysplasia of left breast: Secondary | ICD-10-CM | POA: Diagnosis not present

## 2022-07-19 DIAGNOSIS — N6091 Unspecified benign mammary dysplasia of right breast: Secondary | ICD-10-CM | POA: Diagnosis not present

## 2022-07-19 DIAGNOSIS — Z803 Family history of malignant neoplasm of breast: Secondary | ICD-10-CM | POA: Diagnosis not present

## 2022-07-19 DIAGNOSIS — Z79899 Other long term (current) drug therapy: Secondary | ICD-10-CM

## 2022-07-19 DIAGNOSIS — I700 Atherosclerosis of aorta: Secondary | ICD-10-CM | POA: Diagnosis not present

## 2022-07-19 HISTORY — PX: BREAST LUMPECTOMY WITH RADIOACTIVE SEED AND SENTINEL LYMPH NODE BIOPSY: SHX6550

## 2022-07-19 HISTORY — PX: PORTACATH PLACEMENT: SHX2246

## 2022-07-19 SURGERY — BREAST LUMPECTOMY WITH RADIOACTIVE SEED AND SENTINEL LYMPH NODE BIOPSY
Anesthesia: General | Site: Chest | Laterality: Left

## 2022-07-19 MED ORDER — ONDANSETRON HCL 4 MG/2ML IJ SOLN
INTRAMUSCULAR | Status: DC | PRN
  Administered 2022-07-19: 4 mg via INTRAVENOUS

## 2022-07-19 MED ORDER — EPHEDRINE SULFATE (PRESSORS) 50 MG/ML IJ SOLN
INTRAMUSCULAR | Status: DC | PRN
  Administered 2022-07-19: 10 mg via INTRAVENOUS
  Administered 2022-07-19: 15 mg via INTRAVENOUS

## 2022-07-19 MED ORDER — SUGAMMADEX SODIUM 500 MG/5ML IV SOLN
INTRAVENOUS | Status: DC | PRN
  Administered 2022-07-19: 300 mg via INTRAVENOUS

## 2022-07-19 MED ORDER — PHENYLEPHRINE HCL-NACL 20-0.9 MG/250ML-% IV SOLN: INTRAVENOUS | Status: DC | PRN

## 2022-07-19 MED ORDER — LIDOCAINE-EPINEPHRINE (PF) 1 %-1:200000 IJ SOLN
INTRAMUSCULAR | Status: AC
  Filled 2022-07-19: qty 60

## 2022-07-19 MED ORDER — HEPARIN (PORCINE) IN NACL 1000-0.9 UT/500ML-% IV SOLN
INTRAVENOUS | Status: AC
  Filled 2022-07-19: qty 1000

## 2022-07-19 MED ORDER — FENTANYL CITRATE (PF) 100 MCG/2ML IJ SOLN
INTRAMUSCULAR | Status: AC
  Filled 2022-07-19: qty 2

## 2022-07-19 MED ORDER — CEFAZOLIN SODIUM-DEXTROSE 2-4 GM/100ML-% IV SOLN
INTRAVENOUS | Status: AC
  Filled 2022-07-19: qty 100

## 2022-07-19 MED ORDER — HYDROMORPHONE HCL 1 MG/ML IJ SOLN
0.2500 mg | INTRAMUSCULAR | Status: DC | PRN
  Administered 2022-07-19 (×2): 0.25 mg via INTRAVENOUS

## 2022-07-19 MED ORDER — FENTANYL CITRATE (PF) 100 MCG/2ML IJ SOLN
INTRAMUSCULAR | Status: DC | PRN
  Administered 2022-07-19 (×2): 25 ug via INTRAVENOUS
  Administered 2022-07-19: 100 ug via INTRAVENOUS

## 2022-07-19 MED ORDER — ACETAMINOPHEN 500 MG PO TABS
ORAL_TABLET | ORAL | Status: AC
  Filled 2022-07-19: qty 2

## 2022-07-19 MED ORDER — 0.9 % SODIUM CHLORIDE (POUR BTL) OPTIME
TOPICAL | Status: DC | PRN
  Administered 2022-07-19: 500 mL

## 2022-07-19 MED ORDER — SUGAMMADEX SODIUM 500 MG/5ML IV SOLN
INTRAVENOUS | Status: AC
  Filled 2022-07-19: qty 5

## 2022-07-19 MED ORDER — FENTANYL CITRATE (PF) 100 MCG/2ML IJ SOLN
100.0000 ug | Freq: Once | INTRAMUSCULAR | Status: AC
  Administered 2022-07-19: 50 ug via INTRAVENOUS

## 2022-07-19 MED ORDER — MIDAZOLAM HCL 2 MG/2ML IJ SOLN
INTRAMUSCULAR | Status: AC
  Filled 2022-07-19: qty 2

## 2022-07-19 MED ORDER — PHENYLEPHRINE HCL (PRESSORS) 10 MG/ML IV SOLN
INTRAVENOUS | Status: AC
  Filled 2022-07-19: qty 1

## 2022-07-19 MED ORDER — CHLORHEXIDINE GLUCONATE CLOTH 2 % EX PADS: 6.0000 | MEDICATED_PAD | Freq: Once | CUTANEOUS | Status: DC

## 2022-07-19 MED ORDER — PHENYLEPHRINE HCL (PRESSORS) 10 MG/ML IV SOLN
INTRAVENOUS | Status: DC | PRN
  Administered 2022-07-19: 80 ug via INTRAVENOUS

## 2022-07-19 MED ORDER — LIDOCAINE-EPINEPHRINE (PF) 1 %-1:200000 IJ SOLN
INTRAMUSCULAR | Status: DC | PRN
  Administered 2022-07-19: 10 mL

## 2022-07-19 MED ORDER — ROPIVACAINE HCL 5 MG/ML IJ SOLN
INTRAMUSCULAR | Status: DC | PRN
  Administered 2022-07-19 (×2): 25 mL via PERINEURAL

## 2022-07-19 MED ORDER — AMISULPRIDE (ANTIEMETIC) 5 MG/2ML IV SOLN: 10.0000 mg | Freq: Once | INTRAVENOUS | Status: DC | PRN

## 2022-07-19 MED ORDER — ROCURONIUM BROMIDE 100 MG/10ML IV SOLN
INTRAVENOUS | Status: DC | PRN
  Administered 2022-07-19: 80 mg via INTRAVENOUS

## 2022-07-19 MED ORDER — OXYCODONE HCL 5 MG PO TABS: 5.0000 mg | ORAL_TABLET | Freq: Once | ORAL | Status: DC | PRN

## 2022-07-19 MED ORDER — LIDOCAINE 2% (20 MG/ML) 5 ML SYRINGE
INTRAMUSCULAR | Status: AC
  Filled 2022-07-19: qty 5

## 2022-07-19 MED ORDER — HYDROMORPHONE HCL 1 MG/ML IJ SOLN
INTRAMUSCULAR | Status: AC
  Filled 2022-07-19: qty 0.5

## 2022-07-19 MED ORDER — SUCCINYLCHOLINE CHLORIDE 200 MG/10ML IV SOSY
PREFILLED_SYRINGE | INTRAVENOUS | Status: AC
  Filled 2022-07-19: qty 10

## 2022-07-19 MED ORDER — DEXAMETHASONE SODIUM PHOSPHATE 4 MG/ML IJ SOLN
INTRAMUSCULAR | Status: DC | PRN
  Administered 2022-07-19: 4 mg

## 2022-07-19 MED ORDER — BUPIVACAINE HCL (PF) 0.25 % IJ SOLN
INTRAMUSCULAR | Status: AC
  Filled 2022-07-19: qty 60

## 2022-07-19 MED ORDER — ONDANSETRON HCL 4 MG/2ML IJ SOLN
INTRAMUSCULAR | Status: AC
  Filled 2022-07-19: qty 2

## 2022-07-19 MED ORDER — OXYCODONE HCL 5 MG PO TABS: 5.0000 mg | ORAL_TABLET | Freq: Four times a day (QID) | ORAL | 0 refills | Status: DC | PRN

## 2022-07-19 MED ORDER — HEPARIN SOD (PORK) LOCK FLUSH 100 UNIT/ML IV SOLN
INTRAVENOUS | Status: AC
  Filled 2022-07-19: qty 10

## 2022-07-19 MED ORDER — CLONIDINE HCL (ANALGESIA) 100 MCG/ML EP SOLN
EPIDURAL | Status: DC | PRN
  Administered 2022-07-19 (×2): 40 ug

## 2022-07-19 MED ORDER — LACTATED RINGERS IV SOLN: INTRAVENOUS | Status: DC

## 2022-07-19 MED ORDER — MIDAZOLAM HCL 2 MG/2ML IJ SOLN
2.0000 mg | Freq: Once | INTRAMUSCULAR | Status: AC
  Administered 2022-07-19: 1 mg via INTRAVENOUS

## 2022-07-19 MED ORDER — DEXAMETHASONE SODIUM PHOSPHATE 4 MG/ML IJ SOLN
INTRAMUSCULAR | Status: DC | PRN
  Administered 2022-07-19 (×2): 2.5 mg via INTRAVENOUS

## 2022-07-19 MED ORDER — OXYCODONE HCL 5 MG/5ML PO SOLN: 5.0000 mg | Freq: Once | ORAL | Status: DC | PRN

## 2022-07-19 MED ORDER — LIDOCAINE HCL (CARDIAC) PF 100 MG/5ML IV SOSY
PREFILLED_SYRINGE | INTRAVENOUS | Status: DC | PRN
  Administered 2022-07-19: 100 mg via INTRAVENOUS

## 2022-07-19 MED ORDER — DEXAMETHASONE SODIUM PHOSPHATE 10 MG/ML IJ SOLN
INTRAMUSCULAR | Status: AC
  Filled 2022-07-19: qty 1

## 2022-07-19 MED ORDER — HEPARIN SOD (PORK) LOCK FLUSH 100 UNIT/ML IV SOLN
INTRAVENOUS | Status: DC | PRN
  Administered 2022-07-19: 500 [IU] via INTRAVENOUS

## 2022-07-19 MED ORDER — CEFAZOLIN SODIUM-DEXTROSE 2-4 GM/100ML-% IV SOLN
2.0000 g | INTRAVENOUS | Status: AC
  Administered 2022-07-19: 2 g via INTRAVENOUS

## 2022-07-19 MED ORDER — PROPOFOL 10 MG/ML IV BOLUS
INTRAVENOUS | Status: DC | PRN
  Administered 2022-07-19: 160 mg via INTRAVENOUS

## 2022-07-19 MED ORDER — MAGTRACE LYMPHATIC TRACER
INTRAMUSCULAR | Status: DC | PRN
  Administered 2022-07-19: 4 mL via INTRAMUSCULAR

## 2022-07-19 MED ORDER — ACETAMINOPHEN 500 MG PO TABS
1000.0000 mg | ORAL_TABLET | ORAL | Status: AC
  Administered 2022-07-19: 1000 mg via ORAL

## 2022-07-19 MED ORDER — HEPARIN (PORCINE) IN NACL 2-0.9 UNITS/ML
INTRAMUSCULAR | Status: AC | PRN
  Administered 2022-07-19: 1 via INTRAVENOUS

## 2022-07-19 MED ORDER — PHENYLEPHRINE HCL-NACL 20-0.9 MG/250ML-% IV SOLN
INTRAVENOUS | Status: DC | PRN
  Administered 2022-07-19: 20 ug/min via INTRAVENOUS

## 2022-07-19 SURGICAL SUPPLY — 77 items
ADH SKN CLS APL DERMABOND .7 (GAUZE/BANDAGES/DRESSINGS) ×4
APL PRP STRL LF DISP 70% ISPRP (MISCELLANEOUS) ×6
BAG DECANTER FOR FLEXI CONT (MISCELLANEOUS) ×2 IMPLANT
BINDER BREAST 3XL (GAUZE/BANDAGES/DRESSINGS) IMPLANT
BINDER BREAST LRG (GAUZE/BANDAGES/DRESSINGS) IMPLANT
BINDER BREAST MEDIUM (GAUZE/BANDAGES/DRESSINGS) IMPLANT
BINDER BREAST XLRG (GAUZE/BANDAGES/DRESSINGS) IMPLANT
BINDER BREAST XXLRG (GAUZE/BANDAGES/DRESSINGS) IMPLANT
BLADE HEX COATED 2.75 (ELECTRODE) ×2 IMPLANT
BLADE SURG 10 STRL SS (BLADE) ×2 IMPLANT
BLADE SURG 11 STRL SS (BLADE) ×2 IMPLANT
BLADE SURG 15 STRL LF DISP TIS (BLADE) ×2 IMPLANT
BLADE SURG 15 STRL SS (BLADE) ×2
BNDG CMPR 5X4 CHSV STRCH STRL (GAUZE/BANDAGES/DRESSINGS) ×4
BNDG COHESIVE 4X5 TAN STRL LF (GAUZE/BANDAGES/DRESSINGS) ×2 IMPLANT
CANISTER SUC SOCK COL 7IN (MISCELLANEOUS) IMPLANT
CANISTER SUCT 1200ML W/VALVE (MISCELLANEOUS) ×2 IMPLANT
CHLORAPREP W/TINT 26 (MISCELLANEOUS) ×2 IMPLANT
CLIP TI LARGE 6 (CLIP) ×2 IMPLANT
CLIP TI MEDIUM 6 (CLIP) ×4 IMPLANT
CLIP TI WIDE RED SMALL 6 (CLIP) IMPLANT
COVER BACK TABLE 60X90IN (DRAPES) ×2 IMPLANT
COVER MAYO STAND STRL (DRAPES) ×4 IMPLANT
COVER PROBE CYLINDRICAL 5X96 (MISCELLANEOUS) ×2 IMPLANT
DERMABOND ADVANCED .7 DNX12 (GAUZE/BANDAGES/DRESSINGS) ×2 IMPLANT
DRAPE C-ARM 42X72 X-RAY (DRAPES) ×2 IMPLANT
DRAPE LAPAROTOMY TRNSV 102X78 (DRAPES) ×2 IMPLANT
DRAPE UTILITY XL STRL (DRAPES) ×2 IMPLANT
DRSG TEGADERM 4X4.75 (GAUZE/BANDAGES/DRESSINGS) IMPLANT
ELECT COATED BLADE 2.86 ST (ELECTRODE) ×2 IMPLANT
ELECT REM PT RETURN 9FT ADLT (ELECTROSURGICAL) ×2
ELECTRODE REM PT RTRN 9FT ADLT (ELECTROSURGICAL) ×2 IMPLANT
GAUZE 4X4 16PLY ~~LOC~~+RFID DBL (SPONGE) ×2 IMPLANT
GAUZE PAD ABD 8X10 STRL (GAUZE/BANDAGES/DRESSINGS) ×2 IMPLANT
GAUZE SPONGE 4X4 12PLY STRL LF (GAUZE/BANDAGES/DRESSINGS) ×2 IMPLANT
GLOVE BIO SURGEON STRL SZ 6 (GLOVE) ×2 IMPLANT
GLOVE BIOGEL PI IND STRL 6.5 (GLOVE) ×2 IMPLANT
GLOVE BIOGEL PI IND STRL 7.0 (GLOVE) IMPLANT
GOWN STRL REUS W/ TWL LRG LVL3 (GOWN DISPOSABLE) ×2 IMPLANT
GOWN STRL REUS W/TWL 2XL LVL3 (GOWN DISPOSABLE) ×2 IMPLANT
GOWN STRL REUS W/TWL LRG LVL3 (GOWN DISPOSABLE) ×6
IV CONNECTOR ONE LINK NDLESS (IV SETS) IMPLANT
KIT MARKER MARGIN INK (KITS) ×2 IMPLANT
KIT PORT POWER 8FR ISP CVUE (Port) IMPLANT
LIGHT WAVEGUIDE WIDE FLAT (MISCELLANEOUS) IMPLANT
NDL HYPO 25X1 1.5 SAFETY (NEEDLE) ×2 IMPLANT
NDL SAFETY ECLIP 18X1.5 (MISCELLANEOUS) ×2 IMPLANT
NEEDLE HYPO 25X1 1.5 SAFETY (NEEDLE) ×4 IMPLANT
NS IRRIG 1000ML POUR BTL (IV SOLUTION) ×2 IMPLANT
PACK BASIN DAY SURGERY FS (CUSTOM PROCEDURE TRAY) ×2 IMPLANT
PACK UNIVERSAL I (CUSTOM PROCEDURE TRAY) ×2 IMPLANT
PENCIL SMOKE EVACUATOR (MISCELLANEOUS) ×2 IMPLANT
SLEEVE SCD COMPRESS KNEE MED (STOCKING) ×2 IMPLANT
SPIKE FLUID TRANSFER (MISCELLANEOUS) IMPLANT
SPONGE T-LAP 18X18 ~~LOC~~+RFID (SPONGE) ×4 IMPLANT
STAPLER VISISTAT 35W (STAPLE) IMPLANT
STOCKINETTE IMPERVIOUS LG (DRAPES) ×2 IMPLANT
STRIP CLOSURE SKIN 1/2X4 (GAUZE/BANDAGES/DRESSINGS) ×2 IMPLANT
SUT ETHILON 2 0 FS 18 (SUTURE) IMPLANT
SUT MNCRL AB 4-0 PS2 18 (SUTURE) ×2 IMPLANT
SUT MON AB 5-0 PS2 18 (SUTURE) IMPLANT
SUT PROLENE 2 0 SH DA (SUTURE) ×4 IMPLANT
SUT SILK 2 0 SH (SUTURE) IMPLANT
SUT VIC AB 2-0 SH 27 (SUTURE) ×4
SUT VIC AB 2-0 SH 27XBRD (SUTURE) ×2 IMPLANT
SUT VIC AB 3-0 SH 27 (SUTURE) ×2
SUT VIC AB 3-0 SH 27X BRD (SUTURE) ×2 IMPLANT
SUT VICRYL 3-0 CR8 SH (SUTURE) ×2 IMPLANT
SYR 10ML LL (SYRINGE) ×2 IMPLANT
SYR 5ML LUER SLIP (SYRINGE) ×2 IMPLANT
SYR BULB EAR ULCER 3OZ GRN STR (SYRINGE) ×2 IMPLANT
SYR CONTROL 10ML LL (SYRINGE) ×2 IMPLANT
TOWEL GREEN STERILE FF (TOWEL DISPOSABLE) ×2 IMPLANT
TRACER MAGTRACE VIAL (MISCELLANEOUS) IMPLANT
TRAY FAXITRON CT DISP (TRAY / TRAY PROCEDURE) ×2 IMPLANT
TUBE CONNECTING 20X1/4 (TUBING) ×2 IMPLANT
YANKAUER SUCT BULB TIP NO VENT (SUCTIONS) ×2 IMPLANT

## 2022-07-19 NOTE — Transfer of Care (Signed)
Immediate Anesthesia Transfer of Care Note  Patient: [place]  Procedure(s) Performed: BILATERAL BREAST LUMPECTOMY WITH RADIOACTIVE SEED AND BILATERAL SENTINEL LYMPH NODE BIOPSY (Bilateral: Breast) INSERTION PORT-A-CATH (Left: Chest)  Patient Location: PACU  Anesthesia Type:GA combined with regional for post-op pain  Level of Consciousness: sedated  Airway & Oxygen Therapy: Patient Spontanous Breathing and Patient connected to face mask oxygen  Post-op Assessment: Report given to RN and Post -op Vital signs reviewed and stable  Post vital signs: Reviewed and stable  Last Vitals:  Vitals Value Taken Time  BP    Temp    Pulse    Resp    SpO2      Last Pain:  Vitals:   07/19/22 0629  TempSrc: Oral  PainSc: 0-No pain      Patients Stated Pain Goal: 3 ([id number])  Complications: No notable events documented.

## 2022-07-19 NOTE — Interval H&P Note (Signed)
History and Physical Interval Note:  07/19/2022 7:34 AM  Christy Lynch  has presented today for surgery, with the diagnosis of BILATERAL BREAST CANCER.  The various methods of treatment have been discussed with the patient and family. After consideration of risks, benefits and other options for treatment, the patient has consented to  Procedure(s): BILATERAL BREAST LUMPECTOMY WITH RADIOACTIVE SEED AND BILATERAL SENTINEL LYMPH NODE BIOPSY (Bilateral) INSERTION PORT-A-CATH (N/A) as a surgical intervention.  The patient's history has been reviewed, patient examined, no change in status, stable for surgery.  I have reviewed the patient's chart and labs.  Questions were answered to the patient's satisfaction.     Stark Klein

## 2022-07-19 NOTE — Anesthesia Procedure Notes (Signed)
Procedure Name: Intubation Date/Time: 07/19/2022 7:57 AM  Performed by: Willa Frater, CRNAPre-anesthesia Checklist: Patient identified, Emergency Drugs available, Suction available and Patient being monitored Patient Re-evaluated:Patient Re-evaluated prior to induction Oxygen Delivery Method: Circle system utilized Preoxygenation: Pre-oxygenation with 100% oxygen Induction Type: IV induction Ventilation: Mask ventilation without difficulty Laryngoscope Size: Mac and 3 Grade View: Grade I Tube type: Oral Tube size: 7.0 mm Number of attempts: 1 Airway Equipment and Method: Stylet and Oral airway Placement Confirmation: ETT inserted through vocal cords under direct vision, positive ETCO2 and breath sounds checked- equal and bilateral Secured at: 22 cm Tube secured with: Tape Dental Injury: Teeth and Oropharynx as per pre-operative assessment

## 2022-07-19 NOTE — Op Note (Addendum)
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 The risks, benefits, complications, treatment options, and expected outcomes were discussed with the patient. The possibilities of bleeding, infection, the need for additional procedures, failure to diagnose a condition, and creating a complication requiring transfusion or operation were discussed with the patient. The patient concurred with the proposed plan, giving informed consent.  The site of surgery properly noted/marked. The patient was taken to Operating Room # 8, identified, and the procedure verified as above with a time out.  The MagTrace was injected into the subareolar position bilaterally.    Patient's arms were tucked and the upper chest and neck were prepped and draped in sterile fashion.  Time-out was  performed according to the surgical safety check list.  When all was  correct, we continued.   Local anesthetic was administered over this   area at the angle of the clavicle.  The vein was accessed with 1 pass(es) of the needle. There was good venous return and the wire passed easily with no ectopy.   Fluoroscopy was used to  confirm that the wire was in the vena cava.      The patient was placed back level and the area for the pocket was anethetized   with local anesthetic.  A 3-cm transverse incision was made with a #15   blade.  Cautery was used to divide the subcutaneous tissues down to the   pectoralis muscle.  An [employer] retractor was used to elevate the skin   while a pocket was created on top of the pectoralis fascia.  The port   was placed into the pocket to confirm that it was of adequate size.  The   catheter was preattached to the port.  The port was then secured to the   pectoralis fascia with four 2-0 Prolene sutures.  These were clamped and   not tied down yet.    The catheter was tunneled through to the wire exit   site.  The catheter was placed along the wire to determine what length it should be to be in the SVC.  The catheter was cut at 25 cm.  The tunneler sheath and dilator were passed over the wire and the dilator and wire were removed.  The catheter was advanced through the tunneler sheath and the tunneler sheath was pulled away.  Care was taken to keep the catheter in the tunneler sheath as this occurred. This was advanced and the tunneler sheath was removed.  There was good venous   return and easy flush of the catheter.  The Prolene sutures were tied   down to the pectoral fascia.  The skin was reapproximated using 3-0   Vicryl interrupted deep dermal sutures.    Fluoroscopy was used to re-confirm good  position of the catheter.  The skin   was then closed using 4-0 Monocryl in a subcuticular fashion.  The port was flushed with concentrated heparin flush as well. This was dressed with dermabond.  The patient was repositioned.  The bilateral breast, chest, and the left arm were prepped and draped in standard fashion. A Time Out was held and the above information confirmed.   The right side was addressed first.  The lumpectomy was performed by creating an axillary incision near the  previously placed radioactive seed.  Dissection was carried down to around the point of maximum signal intensity. The cautery was used to perform the dissection.  Hemostasis was achieved with cautery.  The specimen was inked with the margin marker paint kit.    Specimen radiography confirmed inclusion of the mammographic lesion, the clip, and the seed.  The background signal in the breast was zero.  Additional margins were taken at the posterolateral, superior, inferior, and medial  margin(s). The wound was irrigated and reinspected for hemostasis.  The edges of the cavity were marked with large clips.  The skin was then closed with 3-0 vicryl in layers and 4-0 monocryl subcuticular suture.    Using a sentimag probe, right axillary sentinel nodes were identified. Dissection was carried through the clavipectoral fascia. The same axillary incision was used. The first node was immediately identified as it was rust colored.      A total of two deep level 2 axillary sentinel nodes were removed.  Counts per second were >9999, and 350.    The background count was negative cps.  The wound was irrigated.  Hemostasis was achieved with cautery.  The axillary incision was closed with a 3-0 vicryl deep dermal interrupted sutures and a 4-0 monocryl subcuticular closure.  The left side was then addressed.  The ADH was addressed first.  An inferior circuamareolar incision was made.  The skin hooks were used to elevate the skin and an excisional biopsy was taken with the cautery.  The seed became visible anteriorly and was sent as a separate specimen. A radiograph was taken of this to confirm for radiology that it was removed.  The adjacent tissue was firm and was like a palpable mass.  The clip was in this specimen.  It was marked.  Only one orange clip was placed in this cavity.  The skin was closed with interrupted 3-0 vicryl deep dermal sutures and running subcuticular 4-0 monocryl suture.    The left sided cancer was then  addressed.  An axillary incision was made as the cancer was high and lateral in the upper outer quadrant.  The lumpectomy was performed similarly.  A seed and two clips were removed en block.  These clips and seeds appeared to be fairly centrally located in the specimen.  The nodes were also found similarly to the right side via the left axillary incision already made.  Two deep axillary nodes were found with magtrace present.  The first was 8500 cps and the second was approximately 400 cps.  Background was negative.    The wound was irrigated.  Hemostasis was achieved with cautery.  The axillary incision was closed with a 3-0 vicryl deep dermal interrupted sutures and a 4-0 monocryl subcuticular closure.    Sterile dressings were applied. At the end of the operation, all sponge, instrument, and needle counts were correct.  Findings: grossly clear surgical margins and no adenopathy RIGHT side, anterior and lateral margins are now skin, posterior margin  is pectoralis LEFT side: ADH site has nipple areolar complex and the anterior margin.   LEFT side cancer: Skin is anterior and lateral margins.  Pectoralis is the posteromedial margin  Estimated Blood Loss:  min         Specimens:  right breast tissue with seed Posterolateral margin Medial margin 4.  Superior margin 5.  Inferior margin 6.   Right axillary SLN #1 7.   Right axillary SLN #2 8.   Seed 9.  Left periareolar/central lumpectomy ADH  10.   Left breast lumpectomy with seed, UOQ/cancer 11.  Left axillary SLN #1 12..  Left axillary SLN #2         Complications:  None; patient tolerated the procedure well.         Disposition: PACU - hemodynamically stable.         Condition: stable

## 2022-07-19 NOTE — Progress Notes (Signed)
Assisted Dr. Lissa Hoard with left, right, pectoralis, ultrasound guided block. Side rails up, monitors on throughout procedure. See vital signs in flow sheet. Tolerated Procedure well.

## 2022-07-19 NOTE — Discharge Instructions (Addendum)
[place] Office Phone Number [phone]  BREAST BIOPSY/ PARTIAL MASTECTOMY: POST OP INSTRUCTIONS  Always review your discharge instruction sheet given to you by the facility where your surgery was performed.  IF YOU HAVE DISABILITY OR FAMILY LEAVE FORMS, YOU MUST BRING THEM TO THE OFFICE FOR PROCESSING.  DO NOT GIVE THEM TO YOUR DOCTOR.  Take 2 tylenol (acetominophen) three times a day for 3 days.  If you still have pain, add ibuprofen with food in between if able to take this (if you have kidney issues or stomach issues, do not take ibuprofen).  If both of those are not enough, add the narcotic pain pill.  If you find you are needing a lot of this overnight after surgery, call the next morning for a refill.    Prescriptions will not be filled after 5pm or on week-ends. Take your usually prescribed medications unless otherwise directed You should eat very light the first 24 hours after surgery, such as soup, crackers, pudding, etc.  Resume your normal diet the day after surgery. Most patients will experience some swelling and bruising in the breast.  Ice packs and a good support bra will help.  Swelling and bruising can take several days to resolve.  It is common to experience some constipation if taking pain medication after surgery.  Increasing fluid intake and taking a stool softener will usually help or prevent this problem from occurring.  A mild laxative (Milk of Magnesia or Miralax) should be taken according to package directions if there are no bowel movements after 48 hours. Unless discharge instructions indicate otherwise, you may remove your bandages 48 hours after surgery, and you may shower at that time.  You may have steri-strips (small skin tapes) in place directly over the incision.  These strips should be left on the skin at least for for 7-10 days.    ACTIVITIES:  You may resume regular daily activities (gradually increasing) beginning the next day.  Wearing a  good support bra or sports bra (or the breast binder) minimizes pain and swelling.  You may have sexual intercourse when it is comfortable. No heavy lifting for 1-2 weeks (not over around 10 pounds).  You may drive when you no longer are taking prescription pain medication, you can comfortably wear a seatbelt, and you can safely maneuver your car and apply brakes. RETURN TO WORK:  __________3-14 days depending on job. _______________ Christy Lynch should see your doctor in the office for a follow-up appointment approximately two weeks after your surgery.  Your doctor's nurse will typically make your follow-up appointment when she calls you with your pathology report.  Expect your pathology report 3-4 business days after your surgery.  You may call to check if you do not hear from [place] after three days.   WHEN TO CALL YOUR DOCTOR: Fever over 101.0 Nausea and/or vomiting. Extreme swelling or bruising. Continued bleeding from incision. Increased pain, redness, or drainage from the incision.  The clinic staff is available to answer your questions during regular business hours.  Please don't hesitate to call and ask to speak to one of the nurses for clinical concerns.  If you have a medical emergency, go to the nearest emergency room or call 911.  A surgeon from [facility] is always on call at the hospital.  For further questions, please visit [email or web address]    Post Anesthesia Home Care Instructions  Activity: Get plenty of rest for the remainder of the day. A responsible individual must stay  with you for 24 hours following the procedure.  For the next 24 hours, DO NOT: -Drive a car -Paediatric nurse -Drink alcoholic beverages -Take any medication unless instructed by your physician -Make any legal decisions or sign important papers.  Meals: Start with liquid foods such as gelatin or soup. Progress to regular foods as tolerated. Avoid greasy, spicy, heavy foods. If nausea  and/or vomiting occur, drink only clear liquids until the nausea and/or vomiting subsides. Call your physician if vomiting continues.  Special Instructions/Symptoms: Your throat may feel dry or sore from the anesthesia or the breathing tube placed in your throat during surgery. If this causes discomfort, gargle with warm salt water. The discomfort should disappear within 24 hours.  If you had a scopolamine patch placed behind your ear for the management of post- operative nausea and/or vomiting:  1. The medication in the patch is effective for 72 hours, after which it should be removed.  Wrap patch in a tissue and discard in the trash. Wash hands thoroughly with soap and water. 2. You may remove the patch earlier than 72 hours if you experience unpleasant side effects which may include dry mouth, dizziness or visual disturbances. 3. Avoid touching the patch. Wash your hands with soap and water after contact with the patch.

## 2022-07-19 NOTE — Anesthesia Postprocedure Evaluation (Signed)
Anesthesia Post Note  Patient: [place]  Procedure(s) Performed: BILATERAL BREAST LUMPECTOMY WITH RADIOACTIVE SEED AND BILATERAL SENTINEL LYMPH NODE BIOPSY (Bilateral: Breast) INSERTION PORT-A-CATH (Left: Chest)     Patient location during evaluation: PACU Anesthesia Type: General Level of consciousness: sedated and patient cooperative Pain management: pain level controlled Vital Signs Assessment: post-procedure vital signs reviewed and stable Respiratory status: spontaneous breathing Cardiovascular status: stable Anesthetic complications: no   No notable events documented.  Last Vitals:  Vitals:   07/19/22 1329 07/19/22 1335  BP:  134/71  Pulse:  70  Resp:  18  Temp:    SpO2: 96% 97%    Last Pain:  Vitals:   07/19/22 1348  TempSrc:   PainSc: [place]

## 2022-07-19 NOTE — Anesthesia Procedure Notes (Signed)
Anesthesia Regional Block: Pectoralis block   Pre-Anesthetic Checklist: , timeout performed,  Correct Patient, Correct Site, Correct Laterality,  Correct Procedure, Correct Position, site marked,  Risks and benefits discussed,  Surgical consent,  Pre-op evaluation,  At surgeon's request and post-op pain management  Laterality: Left and Right  Prep: chloraprep       Needles:   Needle Type: Stimiplex     Needle Length: 9cm      Additional Needles:   Procedures:,,,, ultrasound used (permanent image in chart),,    Narrative:  Start time: 07/19/2022 6:49 AM End time: 07/19/2022 7:09 AM Injection made incrementally with aspirations every 5 mL.  Performed by: Personally  Anesthesiologist: Nolon Nations, MD  Additional Notes: BP cuff, SpO2 and EKG monitors applied. Sedation begun.  Anesthetic injected incrementally, slowly, and after neg aspirations under direct ultrasound guidance. Good fascial spread noted. Patient tolerated well.

## 2022-07-20 ENCOUNTER — Encounter (HOSPITAL_BASED_OUTPATIENT_CLINIC_OR_DEPARTMENT_OTHER): Payer: Self-pay | Admitting: General Surgery

## 2022-07-20 ENCOUNTER — Encounter: Payer: Self-pay | Admitting: Hematology and Oncology

## 2022-07-24 LAB — SURGICAL PATHOLOGY

## 2022-07-24 NOTE — Progress Notes (Signed)
Patient Care Team: Glenda Chroman, MD as PCP - General (Internal Medicine) Nicholas Lose, MD as Consulting Physician (Hematology and Oncology) Mauro Kaufmann, RN as Oncology Nurse Navigator Rockwell Germany, RN as Oncology Nurse Navigator  DIAGNOSIS:  Encounter Diagnosis  Name Primary?   Malignant neoplasm of upper-outer quadrant of left breast in female, estrogen receptor positive ([place]) Yes    SUMMARY OF ONCOLOGIC HISTORY: Oncology History  Malignant neoplasm of upper-outer quadrant of left breast in female, estrogen receptor positive ([place])  04/30/2022 Initial Diagnosis   Screening mammogram to treat left breast mass measuring 0.7 cm, adjacent mass 0.5 cm, biopsy of both revealed grade 3 IDC with DCIS ER 100% PR 100% Ki-67 60%, HER2 3+ positive   05/25/2022 Cancer Staging   Staging form: Breast, AJCC 8th Edition - Clinical: Stage IA (cT1b, cN0, cM0, G3, ER+, PR+, HER2+) - Signed by Nicholas Lose, MD on 05/25/2022 Stage prefix: Initial diagnosis Histologic grading system: 3 grade system   07/19/2022 Surgery   Right lumpectomy: Grade 2 IDC, 1 cm with intermediate grade DCIS, ER 95%, PR 90%,, Ki-67 5%, HER2 -, 0/2 lymph nodes negative, anterior margin positive Left lumpectomy: Grade 3 IDC 1.1 cm with DCIS, margins negative, 0/2 lymph nodes negative, ER 100%, PR 100%, HER2 positive 3+, Ki-67 60%   07/29/2022 Genetic Testing   Negative genetic testing on the CancerNext-Expanded+RNAinsight panel.  The report date is July 29, 2022.  The CancerNext-Expanded gene panel offered by [facility] and includes sequencing and rearrangement analysis for the following 77 genes: AIP, ALK, APC*, ATM*, AXIN2, BAP1, BARD1, BMPR1A, BRCA1*, BRCA2*, BRIP1*, CDC73, CDH1*, CDK4, CDKN1B, CDKN2A, CHEK2*, CTNNA1, DICER1, FH, FLCN, KIF1B, LZTR1, MAX, MEN1, MET, MLH1*, MSH2*, MSH3, MSH6*, MUTYH*, NF1*, NF2, NTHL1, PALB2*, PHOX2B, PMS2*, POT1, PRKAR1A, PTCH1, PTEN*, RAD51C*, RAD51D*, RB1, RET, SDHA, SDHAF2, SDHB,  SDHC, SDHD, SMAD4, SMARCA4, SMARCB1, SMARCE1, STK11, SUFU, TMEM127, TP53*, TSC1, TSC2, and VHL (sequencing and deletion/duplication); EGFR, EGLN1, HOXB13, KIT, MITF, PDGFRA, POLD1, and POLE (sequencing only); EPCAM and GREM1 (deletion/duplication only). DNA and RNA analyses performed for * genes.      CHIEF COMPLIANT: Follow-up after surgery  INTERVAL HISTORY: Christy Lynch is a 67 y.o. female is here because of recent diagnosis of left breast cancer. She presents to the clinic for a follow-up after surgery.  She is healing and recovering very well from recent surgery.    ALLERGIES:  is allergic to flagyl [metronidazole].  MEDICATIONS:  Current Outpatient Medications  Medication Sig Dispense Refill   aspirin EC 81 MG tablet Take 81 mg by mouth daily. Swallow whole.     atenolol (TENORMIN) 50 MG tablet Take 50 mg by mouth every evening.     DULoxetine (CYMBALTA) 60 MG capsule Take 60 mg by mouth every evening.     esomeprazole (NEXIUM) 40 MG capsule Take 40 mg by mouth daily at 12 noon.     lisinopril-hydrochlorothiazide (ZESTORETIC) 20-25 MG tablet Take 1 tablet by mouth daily.     Multiple Vitamin (MULTIVITAMIN WITH MINERALS) TABS tablet Take 1 tablet by mouth every evening.     oxyCODONE (OXY IR/ROXICODONE) 5 MG immediate release tablet Take 1 tablet (5 mg total) by mouth every 6 (six) hours as needed for severe pain. 15 tablet 0   No current facility-administered medications for this visit.    PHYSICAL EXAMINATION: ECOG PERFORMANCE STATUS: 0 - Asymptomatic  Vitals:   07/31/22 1132  BP: (!) 143/68  Pulse: 87  Resp: 18  Temp: 97.9 F (36.6  C)  SpO2: 98%   Filed Weights   07/31/22 1132  Weight: 255 lb (115.7 kg)      LABORATORY DATA:  I have reviewed the data as listed    Latest Ref Rng & Units 07/13/2022    2:55 PM 06/11/2022    8:40 AM 03/23/2013    2:43 PM  CMP  Glucose 70 - 99 mg/dL 112  97  96   BUN 8 - 23 mg/dL 20  16  13    Creatinine 0.44 - 1.00 mg/dL  1.15  1.15  0.90   Sodium 135 - 145 mmol/L 136  137  142   Potassium 3.5 - 5.1 mmol/L 3.6  3.8  4.1   Chloride 98 - 111 mmol/L 103  100  105   CO2 22 - 32 mmol/L 25  25  29    Calcium 8.9 - 10.3 mg/dL 9.4  9.7  9.5   Total Protein 6.0 - 8.3 g/dL   7.5   Total Bilirubin 0.3 - 1.2 mg/dL   0.4   Alkaline Phos 39 - 117 U/L   93   AST 0 - 37 U/L   26   ALT 0 - 35 U/L   25     Lab Results  Component Value Date   WBC 6.9 06/11/2022   HGB 11.6 (L) 06/11/2022   HCT 37.1 06/11/2022   MCV 86.5 06/11/2022   PLT 319 06/11/2022   NEUTROABS 4.4 03/23/2013    ASSESSMENT & PLAN:  Malignant neoplasm of upper-outer quadrant of left breast in female, estrogen receptor positive ([place]) 04/30/2022: Screening mammogram detected left breast mass: Diagnostic mammogram and ultrasound revealed 2 o'clock position left breast irregular hypoechoic mass with calcification measuring 0.7 cm. Adjacent to this another irregular hypoechoic mass 0.5 cm. Axilla negative. Biopsy revealed grade 3 IDC with DCIS ER 100%, PR 100%, Ki-67 60%, HER2 3+ positive   07/19/2022: Right lumpectomy: Grade 2 IDC, 1 cm with intermediate grade DCIS, ER 95%, PR 90%,, Ki-67 5%, HER2 -, 0/2 lymph nodes negative, anterior margin positive (it is skin so therefore no additional surgery is needed) Left lumpectomy: Grade 3 IDC 1.1 cm with DCIS, margins negative, 0/2 lymph nodes negative, ER 100%, PR 100%, HER2 positive 3+, Ki-67 60% ------------------------------------------------------------------------------------------------------------------------------------------------ Treatment plan: Adjuvant chemotherapy with Taxol Herceptin followed by Herceptin maintenance Adjuvant radiation Adjuvant antiestrogen therapy ------------------------------------------------------------------------------------------------------------------------ Return to clinic on 08/13/2022 to start chemo    Orders Placed This Encounter  Procedures   ECHOCARDIOGRAM  COMPLETE    Standing Status:   Future    Standing Expiration Date:   07/31/2023    Order Specific Question:   Where should this test be performed    Answer:   [place]    Order Specific Question:   Perflutren DEFINITY (image enhancing agent) should be administered unless hypersensitivity or allergy exist    Answer:   Administer Perflutren    Order Specific Question:   Reason for exam-Echo    Answer:   Chemo  [id number]    Order Specific Question:   Release to patient    Answer:   Immediate   The patient has a good understanding of the overall plan. she agrees with it. she will call with any problems that may develop before the next visit here. Total time spent: 30 mins including face to face time and time spent for planning, charting and co-ordination of care   Harriette Ohara, MD 07/31/22    I Gardiner Coins am acting as a  scribe for Dr.Yong Wahlquist Lindi Adie  I have reviewed the above documentation for accuracy and completeness, and I agree with the above.

## 2022-07-27 ENCOUNTER — Encounter: Payer: Self-pay | Admitting: *Deleted

## 2022-07-27 DIAGNOSIS — Z170 Estrogen receptor positive status [ER+]: Secondary | ICD-10-CM

## 2022-07-30 ENCOUNTER — Telehealth: Payer: Self-pay | Admitting: Genetic Counselor

## 2022-07-30 ENCOUNTER — Encounter: Payer: Self-pay | Admitting: Genetic Counselor

## 2022-07-30 DIAGNOSIS — Z1379 Encounter for other screening for genetic and chromosomal anomalies: Secondary | ICD-10-CM | POA: Insufficient documentation

## 2022-07-30 NOTE — Telephone Encounter (Signed)
LM on VM that results are back and to please call.  Left CB instructions. 

## 2022-07-31 ENCOUNTER — Inpatient Hospital Stay (HOSPITAL_BASED_OUTPATIENT_CLINIC_OR_DEPARTMENT_OTHER): Payer: Medicare Other | Admitting: Hematology and Oncology

## 2022-07-31 ENCOUNTER — Encounter: Payer: Self-pay | Admitting: *Deleted

## 2022-07-31 ENCOUNTER — Encounter: Payer: Self-pay | Admitting: Hematology and Oncology

## 2022-07-31 ENCOUNTER — Telehealth: Payer: Self-pay | Admitting: Genetic Counselor

## 2022-07-31 VITALS — BP 143/68 | HR 87 | Temp 97.9°F | Resp 18 | Ht 67.0 in | Wt 255.0 lb

## 2022-07-31 DIAGNOSIS — Z79899 Other long term (current) drug therapy: Secondary | ICD-10-CM | POA: Diagnosis not present

## 2022-07-31 DIAGNOSIS — C50412 Malignant neoplasm of upper-outer quadrant of left female breast: Secondary | ICD-10-CM

## 2022-07-31 DIAGNOSIS — Z170 Estrogen receptor positive status [ER+]: Secondary | ICD-10-CM

## 2022-07-31 DIAGNOSIS — Z881 Allergy status to other antibiotic agents status: Secondary | ICD-10-CM | POA: Diagnosis not present

## 2022-07-31 MED ORDER — PROCHLORPERAZINE MALEATE 10 MG PO TABS: 10.0000 mg | ORAL_TABLET | Freq: Four times a day (QID) | ORAL | 1 refills | Status: DC | PRN

## 2022-07-31 MED ORDER — LIDOCAINE-PRILOCAINE 2.5-2.5 % EX CREA: TOPICAL_CREAM | CUTANEOUS | 3 refills | Status: DC

## 2022-07-31 MED ORDER — ONDANSETRON HCL 8 MG PO TABS: 8.0000 mg | ORAL_TABLET | Freq: Three times a day (TID) | ORAL | 1 refills | Status: DC | PRN

## 2022-07-31 NOTE — Telephone Encounter (Signed)
LM on VM that results are back and to please call.  Left CB instructions. 

## 2022-07-31 NOTE — Progress Notes (Signed)
START ON PATHWAY REGIMEN - Breast     Cycle 1: A cycle is 7 days:     Trastuzumab-xxxx      Paclitaxel    Cycles 2 through 12: A cycle is every 7 days:     Trastuzumab-xxxx      Paclitaxel    Cycles 13 through 25: A cycle is every 21 days:     Trastuzumab-xxxx   **Always confirm dose/schedule in your pharmacy ordering system**  Patient Characteristics: Postoperative without Neoadjuvant Therapy (Pathologic Staging), Invasive Disease, Adjuvant Therapy, HER2 Positive, ER Positive, Node Negative, pT1c, pN0/N1mi Therapeutic Status: Postoperative without Neoadjuvant Therapy (Pathologic Staging) AJCC Grade: G3 AJCC N Category: pN0 AJCC M Category: cM0 ER Status: Positive (+) AJCC 8 Stage Grouping: IA HER2 Status: Positive (+) Oncotype Dx Recurrence Score: Not Appropriate AJCC T Category: pT1c PR Status: Positive (+) Intent of Therapy: Curative Intent, Discussed with Patient 

## 2022-07-31 NOTE — Assessment & Plan Note (Addendum)
04/30/2022: Screening mammogram detected left breast mass: Diagnostic mammogram and ultrasound revealed 2 o'clock position left breast irregular hypoechoic mass with calcification measuring 0.7 cm. Adjacent to this another irregular hypoechoic mass 0.5 cm. Axilla negative. Biopsy revealed grade 3 IDC with DCIS ER 100%, PR 100%, Ki-67 60%, HER2 3+ positive   07/19/2022: Right lumpectomy: Grade 2 IDC, 1 cm with intermediate grade DCIS, ER 95%, PR 90%,, Ki-67 5%, HER2 -, 0/2 lymph nodes negative, anterior margin positive Left lumpectomy: Grade 3 IDC 1.1 cm with DCIS, margins negative, 0/2 lymph nodes negative, ER 100%, PR 100%, HER2 positive 3+, Ki-67 60% ------------------------------------------------------------------------------------------------------------------------------------------------ Treatment plan: Adjuvant chemotherapy with Taxol Herceptin followed by Herceptin maintenance Adjuvant radiation Adjuvant antiestrogen therapy ------------------------------------------------------------------------------------------------------------------------ Return to clinic in 3 weeks to start chemotherapy

## 2022-08-01 ENCOUNTER — Other Ambulatory Visit: Payer: Medicare Other

## 2022-08-02 ENCOUNTER — Ambulatory Visit: Payer: Self-pay | Admitting: Genetic Counselor

## 2022-08-02 ENCOUNTER — Encounter: Payer: Self-pay | Admitting: Hematology and Oncology

## 2022-08-02 ENCOUNTER — Telehealth: Payer: Self-pay | Admitting: Hematology and Oncology

## 2022-08-02 DIAGNOSIS — Z1379 Encounter for other screening for genetic and chromosomal anomalies: Secondary | ICD-10-CM

## 2022-08-02 DIAGNOSIS — Z170 Estrogen receptor positive status [ER+]: Secondary | ICD-10-CM

## 2022-08-02 NOTE — Progress Notes (Addendum)
HPI:  Christy Lynch was previously seen in the [facility] due to a personal and family history of breast cancer and concerns regarding a hereditary predisposition to cancer. Please refer to our prior cancer genetics clinic note for more information regarding our discussion, assessment and recommendations, at the time. Christy Lynch recent genetic test results were disclosed to her, as were recommendations warranted by these results. These results and recommendations are discussed in more detail below.  CANCER HISTORY:  Oncology History  Malignant neoplasm of upper-outer quadrant of left breast in female, estrogen receptor positive (HCC)  04/30/2022 Initial Diagnosis   Screening mammogram to treat left breast mass measuring 0.7 cm, adjacent mass 0.5 cm, biopsy of both revealed grade 3 IDC with DCIS ER 100% PR 100% Ki-67 60%, HER2 3+ positive   05/25/2022 Cancer Staging   Staging form: Breast, AJCC 8th Edition - Clinical: Stage IA (cT1b, cN0, cM0, G3, ER+, PR+, HER2+) - Signed by [name], MD on 05/25/2022 Stage prefix: Initial diagnosis Histologic grading system: 3 grade system   07/19/2022 Surgery   Right lumpectomy: Grade 2 IDC, 1 cm with intermediate grade DCIS, ER 95%, PR 90%,, Ki-67 5%, HER2 -, 0/2 lymph nodes negative, anterior margin positive Left lumpectomy: Grade 3 IDC 1.1 cm with DCIS, margins negative, 0/2 lymph nodes negative, ER 100%, PR 100%, HER2 positive 3+, Ki-67 60%   07/29/2022 Genetic Testing   Negative genetic testing on the CancerNext-Expanded+RNAinsight panel.  The report date is July 29, 2022.  The CancerNext-Expanded gene panel offered by [facility] and includes sequencing and rearrangement analysis for the following 77 genes: AIP, ALK, APC*, ATM*, AXIN2, BAP1, BARD1, BMPR1A, BRCA1*, BRCA2*, BRIP1*, CDC73, CDH1*, CDK4, CDKN1B, CDKN2A, CHEK2*, CTNNA1, DICER1, FH, FLCN, KIF1B, LZTR1, MAX, MEN1, MET, MLH1*, MSH2*, MSH3, MSH6*, MUTYH*, NF1*,  NF2, NTHL1, PALB2*, PHOX2B, PMS2*, POT1, PRKAR1A, PTCH1, PTEN*, RAD51C*, RAD51D*, RB1, RET, SDHA, SDHAF2, SDHB, SDHC, SDHD, SMAD4, SMARCA4, SMARCB1, SMARCE1, STK11, SUFU, TMEM127, TP53*, TSC1, TSC2, and VHL (sequencing and deletion/duplication); EGFR, EGLN1, HOXB13, KIT, MITF, PDGFRA, POLD1, and POLE (sequencing only); EPCAM and GREM1 (deletion/duplication only). DNA and RNA analyses performed for * genes.    08/13/2022 -  Chemotherapy   Patient is on Treatment Plan : BREAST Paclitaxel + Trastuzumab q7d / Trastuzumab q21d       FAMILY HISTORY:  We obtained a detailed, 4-generation family history.  Significant diagnoses are listed below: Family History  Problem Relation Age of Onset   Breast cancer Mother 18   Hypertension Mother    Stroke Mother    Kidney disease Father    Kidney cancer Brother 49   Colon cancer Maternal Uncle 65   Breast cancer Cousin        maternal first cousin, dx 30s       The patient has two sons who are cancer free.  She ha four sisters and three brothers.  One brother died of kidney cancer at 43. Both of her parents are deceased.   The patient's mother had breast cancer at 60 and died at 68.  She had five siblings, one had colon cancer at 11. This brother has a daughter who had breast cancer in her 63's.  The maternal grandparents.   The patient's father died at 52.  He had two brothers and a sister who were cancer free.  The paternal grandparents.   Christy Lynch is unaware of previous family history of genetic testing for hereditary cancer risks. Patient's maternal ancestors are of African  American descent, and paternal ancestors are of African American descent. There is no reported Ashkenazi Jewish ancestry. There is no known consanguinity.  GENETIC TEST RESULTS: Genetic testing reported out on July 29, 2022 through the CancerNext-Expanded+RNAinsight cancer panel found no pathogenic mutations. The CancerNext-Expanded gene panel offered by [facility] and  includes sequencing and rearrangement analysis for the following 71 genes: AIP, ALK, APC, ATM, BAP1, BARD1, BMPR1A, BRCA1, BRCA2, BRIP1, CDC73, CDH1, CDK4, CDKN1B, CDKN2A, CHEK2, DICER1, FH, FLCN, KIF1B, LZTR1, MAX, MEN1, MET, MLH1, MSH2, MSH6, MUTYH, NF1, NF2, NTHL1, PALB2, PHOX2B, PMS2, POT1, PRKAR1A, PTCH1, PTEN, RAD51C, RAD51D, RB1, RET, SDHA, SDHAF2, SDHB, SDHC, SDHD, SMAD4, SMARCA4, SMARCB1, SMARCE1, STK11, SUFU, TMEM127, TP53, TSC1, TSC2 and VHL (sequencing and deletion/duplication); AXIN2, CTNNA1, EGFR, EGLN1, HOXB13, KIT, MITF, MSH3, PDGFRA, POLD1 and POLE (sequencing only); EPCAM and GREM1 (deletion/duplication only). RNA data is routinely analyzed for use in variant interpretation for all genes. The test report has been scanned into EPIC and is located under the Molecular Pathology section of the Results Review tab.  A portion of the result report is included below for reference.     We discussed with Christy Lynch that because current genetic testing is not perfect, it is possible there may be a gene mutation in one of these genes that current testing cannot detect, but that chance is small.  We also discussed, that there could be another gene that has not yet been discovered, or that we have not yet tested, that is responsible for the cancer diagnoses in the family. It is also possible there is a hereditary cause for the cancer in the family that Christy Lynch did not inherit and therefore was not identified in her testing.  Therefore, it is important to remain in touch with cancer genetics in the future so that we can continue to offer Christy Lynch the most up to date genetic testing.   ADDITIONAL GENETIC TESTING: We discussed with Christy Lynch that her genetic testing was fairly extensive.  If there are genes identified to increase cancer risk that can be analyzed in the future, we would be happy to discuss and coordinate this testing at that time.    CANCER SCREENING RECOMMENDATIONS:  Christy Lynch test result is considered negative (normal).  This means that we have not identified a hereditary cause for her personal and family history of breast cancer at this time. Most cancers happen by chance and this negative test suggests that her cancer may fall into this category.    While reassuring, this does not definitively rule out a hereditary predisposition to cancer. It is still possible that there could be genetic mutations that are undetectable by current technology. There could be genetic mutations in genes that have not been tested or identified to increase cancer risk.  Therefore, it is recommended she continue to follow the cancer management and screening guidelines provided by her oncology and primary healthcare provider.   An individual's cancer risk and medical management are not determined by genetic test results alone. Overall cancer risk assessment incorporates additional factors, including personal medical history, family history, and any available genetic information that may result in a personalized plan for cancer prevention and surveillance   RECOMMENDATIONS FOR FAMILY MEMBERS:  Individuals in this family might be at some increased risk of developing cancer, over the general population risk, simply due to the family history of cancer.  We recommended women in this family have a yearly mammogram beginning at age 75, or 41 years younger than  the earliest onset of cancer, an annual clinical breast exam, and perform monthly breast self-exams. Women in this family should also have a gynecological exam as recommended by their primary provider. All family members should be referred for colonoscopy starting at age 67.  FOLLOW-UP: Lastly, we discussed with Christy Lynch that cancer genetics is a rapidly advancing field and it is possible that new genetic tests will be appropriate for her and/or her family members in the future. We encouraged her to remain in contact with cancer  genetics on an annual basis so we can update her personal and family histories and let her know of advances in cancer genetics that may benefit this family.   Our contact number was provided. Christy Lynch questions were answered to her satisfaction, and she knows she is welcome to call us at anytime with additional questions or concerns.   Christy Cos, MS, [facility] Licensed, Certified Genetic Counselor [email or web address]

## 2022-08-02 NOTE — Telephone Encounter (Signed)
Revealed negative genetic testing.  Discussed that we do not know why she has breast cancer or why there is cancer in the family. It could be due to a different gene that we are not testing, or maybe our current technology may not be able to pick something up.  It will be important for her to keep in contact with genetics to keep up with whether additional testing may be needed. 

## 2022-08-02 NOTE — Telephone Encounter (Signed)
Scheduled per 03/19 work-queue and los, patient has been called and notified.

## 2022-08-03 ENCOUNTER — Telehealth: Payer: Self-pay

## 2022-08-03 NOTE — Telephone Encounter (Signed)
Trial:  S2205, ICE COMPRESS: RANDOMIZED TRIAL OF LIMB CRYOCOMPRESSION VERSUS CONTINUOUS COMPRESSION VERSUS LOW CYCLIC COMPRESSION FOR THE PREVENTION OF TAXANE-INDUCED PERIPHERAL NEUROPATHY [name] was identified by Dr Lindi Adie as a potential candidate for the above listed study.  This Clinical Research Nurse met with [name], [id number], on @today @ via phone in a manner and location that ensures patient privacy to discuss participation in the above listed research study.  Patient is Unaccompanied.  A copy of the informed consent document and separate HIPAA Authorization was provided to the patient.  Patient reads, speaks, and understands [place].   Patient was provided with the business card of this Nurse and encouraged to contact the research team with any questions.  Approximately 10 minutes were spent with the patient reviewing the informed consent documents.  Patient was provided the option of taking informed consent documents home to review and was encouraged to review at their convenience with their support network, including other care providers.  Per patient request, consents were sent to her via [place]. I confirmed the address in the demographics is correct and immediately took the consents to the mail room. Plan follow up late Monday 08/06/22 by phone to see if she rec'd consents. She has chemo ed Tuesday afternoon.  Marjie Skiff Moorea Boissonneault, RN, BSN, [facility] She  Her  Hers Clinical Research Nurse [facility] Direct Dial [phone]  Pager [phone] 08/03/2022 10:55 AM

## 2022-08-06 ENCOUNTER — Telehealth: Payer: Self-pay

## 2022-08-06 NOTE — Telephone Encounter (Signed)
S2205, ICE COMPRESS: RANDOMIZED TRIAL OF LIMB CRYOCOMPRESSION VERSUS CONTINUOUS COMPRESSION VERSUS LOW CYCLIC COMPRESSION FOR THE PREVENTION  OF TAXANE-INDUCED PERIPHERAL NEUROPATHY  Called Ms Durakovic to see if consents arrived in the mail. No answer; left VM. Will try to catch patient before her chemo ed appt tomorrow.  Marjie Skiff Juni Glaab, RN, BSN, [facility] She  Her  Hers Clinical Research Nurse [place] [phone]  Pager [phone] 08/06/2022 4:02 PM

## 2022-08-07 ENCOUNTER — Encounter: Payer: Self-pay | Admitting: *Deleted

## 2022-08-07 ENCOUNTER — Inpatient Hospital Stay: Payer: Medicare Other

## 2022-08-07 ENCOUNTER — Inpatient Hospital Stay: Payer: Medicare Other | Admitting: Pharmacist

## 2022-08-07 DIAGNOSIS — Z170 Estrogen receptor positive status [ER+]: Secondary | ICD-10-CM | POA: Diagnosis not present

## 2022-08-07 DIAGNOSIS — Z881 Allergy status to other antibiotic agents status: Secondary | ICD-10-CM | POA: Diagnosis not present

## 2022-08-07 DIAGNOSIS — Z79899 Other long term (current) drug therapy: Secondary | ICD-10-CM | POA: Diagnosis not present

## 2022-08-07 DIAGNOSIS — C50412 Malignant neoplasm of upper-outer quadrant of left female breast: Secondary | ICD-10-CM | POA: Diagnosis not present

## 2022-08-07 NOTE — Research (Signed)
S2205, ICE COMPRESS: RANDOMIZED TRIAL OF LIMB CRYOCOMPRESSION VERSUS CONTINUOUS COMPRESSION VERSUS LOW CYCLIC COMPRESSION FOR THE PREVENTION  OF TAXANE-INDUCED PERIPHERAL NEUROPATHY   Patient [name] was identified by Dr. Lindi Adie as a potential candidate for the above listed study.  This Clinical Research Nurse met with Christy Lynch, [id number] on 08/07/22 in a manner and location that ensures patient privacy to discuss participation in the above listed research study.  Patient is Unaccompanied.  Patient was previously provided with informed consent documents.  Patient confirmed they have read the informed consent documents.  As outlined in the informed consent form, this Nurse and Christy Lynch discussed the purpose of the research study, the investigational nature of the study, study procedures and requirements for study participation, potential risks and benefits of study participation, as well as alternatives to participation.  This study is not blinded or double-blinded. The patient understands participation is voluntary and they may withdraw from study participation at any time.  Each study arm was reviewed, and randomization discussed.  Potential side effects were reviewed with patient as outlined in the consent form, and patient made aware there may be side effects not yet known. This study does not involve a placebo. Patient understands enrollment is pending full eligibility review.   Confidentiality and how the patient's information will be used as part of study participation were discussed.  Patient was informed there is not reimbursement provided for their time and effort spent on trial participation.  The patient is encouraged to discuss research study participation with their insurance provider to determine what costs they may incur as part of study participation, including research related injury.    All questions were answered to patient's satisfaction.  The  informed consent and separate HIPAA Authorization was reviewed page by page.  The patient's mental and emotional status is appropriate to provide informed consent, and the patient verbalizes an understanding of study participation.  Patient has agreed to participate in the above listed research study and has voluntarily signed the informed consent version [place] Active Date: 01/02/2022 (Protocol Version Date 11/08/21) and separate HIPAA Authorization, version date 07/27/21 (IRB approval date 04/25/2022  on 08/07/22 at 4:36PM.  The patient was provided with a copy of the signed informed consent form and separate HIPAA Authorization for their reference.  No study specific procedures were obtained prior to the signing of the informed consent document.  Approximately 30 minutes were spent with the patient reviewing the informed consent documents.  Patient was not requested to complete a Release of Information form. The pt was given her baseline study questionnaires and was informed to wait until her research nurse, Christy Lynch, instructs her to complete the questionnaires.  The pt is aware that there will be additional testing required for the study that will need to be done prior to her first taxane treatment. The pt verbalized understanding.  The pt was thanked for her support of this clinical study.   Brion Aliment RN, BSN, CCRP Clinical Research Nurse Lead 08/07/2022 5:04 PM

## 2022-08-07 NOTE — Progress Notes (Signed)
Christine       Telephone: [phone]?Fax: [phone]   Oncology Clinical Pharmacist Practitioner Initial Assessment  AVRIANNA APOSTOLOPOULOS is a 67 y.o. female with a diagnosis of breast cancer. They were contacted today via in-person visit.  Indication/Regimen Trastuzumab (Herceptin) and Paclitaxel (Taxol) are being used appropriately for treatment of breast cancer by Dr. Nicholas Lose.      Wt Readings from Last 1 Encounters:  07/31/22 255 lb (115.7 kg)    Estimated body surface area is 2.34 meters squared as calculated from the following:   Height as of 07/31/22: 5\' 7"  (1.702 m).   Weight as of 07/31/22: 255 lb (115.7 kg).  The dosing regimen is every 7 days for 12 cycles  Trastuzumab (4 mg/kg load, 2 mg/kg maintenance) on Day 1 Paclitaxel (80 mg/m2) on Day 1  Dose Modifications No dose reductions at this time   Allergies Allergies  Allergen Reactions   Flagyl [Metronidazole] Nausea And Vomiting and Other (See Comments)    headache    Vitals = No vitals or labs were done today for this chemotherapy education visit   Contraindications Contraindications were reviewed? Yes Contraindications to therapy were identified? No   Safety Precautions (written information also provided) The following safety precautions for the use of trastuzumab + paclitaxel were reviewed:  Fever: reviewed the importance of having a thermometer and the Centers for Disease Control and Prevention (CDC) definition of fever which is 100.74F (38C) or higher. Patient should call 24/7 triage at [phone] if experiencing a fever or any other symptoms Decreased white blood cells (WBCs) and increased risk for infection Decreased platelet count and increased risk of bleeding Decreased hemoglobin, part of the red blood cells that carry iron and oxygen Hair Loss Peripheral Neuropathy Muscle or joint pain or weakness Mouth Irritation or sores Nausea or vomiting Diarrhea or  constipation Nail Changes Fatigue Hypersensitivity reactions Limit alcohol consumption Headache Cardiotoxicity Pneumonitis  Medication Reconciliation Current Outpatient Medications  Medication Sig Dispense Refill   aspirin EC 81 MG tablet Take 81 mg by mouth daily. Swallow whole.     atenolol (TENORMIN) 50 MG tablet Take 50 mg by mouth every evening.     DULoxetine (CYMBALTA) 60 MG capsule Take 60 mg by mouth every evening.     esomeprazole (NEXIUM) 40 MG capsule Take 40 mg by mouth daily at 12 noon.     lisinopril-hydrochlorothiazide (ZESTORETIC) 20-25 MG tablet Take 1 tablet by mouth daily.     Multiple Vitamin (MULTIVITAMIN WITH MINERALS) TABS tablet Take 1 tablet by mouth every evening.     oxyCODONE (OXY IR/ROXICODONE) 5 MG immediate release tablet Take 1 tablet (5 mg total) by mouth every 6 (six) hours as needed for severe pain. 15 tablet 0   lidocaine-prilocaine (EMLA) cream Apply to affected area once (Patient not taking: Reported on 08/07/2022) 30 g 3   ondansetron (ZOFRAN) 8 MG tablet Take 1 tablet (8 mg total) by mouth every 8 (eight) hours as needed for nausea or vomiting. (Patient not taking: Reported on 08/07/2022) 30 tablet 1   prochlorperazine (COMPAZINE) 10 MG tablet Take 1 tablet (10 mg total) by mouth every 6 (six) hours as needed for nausea or vomiting. (Patient not taking: Reported on 08/07/2022) 30 tablet 1   No current facility-administered medications for this visit.    Medication reconciliation is based on the patient's most recent medication list in the electronic medical record (EMR) including herbal products and OTC medications.   The  patient's medication list was reviewed today with the patient? Yes   Drug-drug interactions (DDIs) DDIs were evaluated? Yes Significant DDIs identified? No   Drug-Food Interactions Drug-food interactions were evaluated? Yes Drug-food interactions identified? No   Follow-up Plan  Treatment start date: 08/14/22 Port placement  date: 07/19/22 ECHO date: 08/13/22 scheduled We reviewed prescriptions, premedications, and the treatment regimen with patient. Possible side effects of the treatment regimen were reviewed as well as management strategies.  Can use loperamide as needed for diarrhea, and Senna-S as needed for constipation.  Clinical pharmacy will assist Dr. Nicholas Lose and [name] on an as needed basis going forward  [name] participated in the discussion, expressed understanding, and voiced agreement with the above plan. All questions were answered to her satisfaction. The patient was advised to contact the clinic at [phone] with any questions or concerns prior to her return visit.   I spent 60 minutes assessing the patient.  Elmina Hendel A. Camille Bal, PharmD, BCOP, CPP  Raina Mina, RPH-CPP, 08/07/2022 2:52 PM  **Disclaimer: This note was dictated with voice recognition software. Similar sounding words can inadvertently be transcribed and this note may contain transcription errors which may not have been corrected upon publication of note.**

## 2022-08-07 NOTE — Therapy (Unsigned)
OUTPATIENT PHYSICAL THERAPY BREAST CANCER POST OP FOLLOW UP   Patient Name: Christy Lynch MRN: [id number] DOB:[birth year], 67 y.o., female Today's Date: 08/07/2022  END OF SESSION:   Past Medical History:  Diagnosis Date   Breast Cancer    Left   Bursitis of hip    Esophageal reflux    Family history of breast cancer    Family history of colon cancer    Family history of kidney cancer    Fibromyalgia    Hypertension    Other and unspecified hyperlipidemia    Past Surgical History:  Procedure Laterality Date   ABDOMINAL HYSTERECTOMY     BREAST BIOPSY Left    neg bx early 2000's   BREAST BIOPSY Left 04/30/2022   [place] LT BREAST BX W LOC DEV 1ST LESION IMG BX SPEC US GUIDE 04/30/2022 [facility]   BREAST BIOPSY Left 04/30/2022   [place] LT BREAST BX W LOC DEV EA ADD LESION IMG BX SPEC US GUIDE 04/30/2022 [facility]   BREAST BIOPSY  07/17/2022   MM LT RADIOACTIVE SEED LOC MAMMO GUIDE 07/17/2022 [facility]   BREAST BIOPSY  07/17/2022   MM LT RADIOACTIVE SEED EA ADD LESION LOC MAMMO GUIDE 07/17/2022 [facility]   BREAST BIOPSY  07/17/2022   MM RT RADIOACTIVE SEED LOC MAMMO GUIDE 07/17/2022 [facility]   BREAST LUMPECTOMY WITH RADIOACTIVE SEED AND SENTINEL LYMPH NODE BIOPSY Bilateral 07/19/2022   Procedure: BILATERAL BREAST LUMPECTOMY WITH RADIOACTIVE SEED AND BILATERAL SENTINEL LYMPH NODE BIOPSY;  Surgeon: Stark Klein, MD;  Location: [place];  Service: General;  Laterality: Bilateral;   LEFT HEART CATH  12/12/2006   PORTACATH PLACEMENT Left 07/19/2022   Procedure: INSERTION PORT-A-CATH;  Surgeon: Stark Klein, MD;  Location: [place];  Service: General;  Laterality: Left;   Patient Active Problem List   Diagnosis Date Noted   Genetic testing 07/30/2022   Family history of breast cancer 07/18/2022   Family history of colon cancer 07/18/2022   Family history of kidney cancer 07/18/2022   Malignant neoplasm of  upper-outer quadrant of left breast in female, estrogen receptor positive ([place]) 05/22/2022   Unilateral primary osteoarthritis, left knee 10/14/2019   Vertigo 08/21/2013   Chest pain 07/02/2013   Essential hypertension 07/02/2013   Hyperlipidemia 07/02/2013    PCP: Glenda Chroman, MD   REFERRING PROVIDER: Stark Klein, MD   REFERRING DIAG: C50.412,Z17.0 (ICD-10-CM) - Malignant neoplasm of upper-outer quadrant of left breast in female, estrogen receptor positive ([place])     THERAPY DIAG:  No diagnosis found.  Rationale for Evaluation and Treatment: Rehabilitation  ONSET DATE: 04/30/22  SUBJECTIVE:  SUBJECTIVE STATEMENT: I am doing well. Nothing is bothering me.   PERTINENT HISTORY:  Patient was diagnosed on 04/30/22 with left grade 3. It measures 0.7 cm and the second mass is 0.5 cm and is located in the upper-outer quadrant. It is ER/PR+, HER2+ with a Ki67 of 60%. R breast cancer IDC, 1 cm, grade 2, DCIS, ER/PR+, HER2-, Ki67 5%. 07/19/22- bilateral lumpectomies with bilateral SLNB R 0/2, L 0/2. Pt will start chemo on Tuesday and then will do radiation.   PATIENT GOALS:  Reassess how my recovery is going related to arm function, pain, and swelling.  PAIN:  Are you having pain? No  PRECAUTIONS: Recent Surgery, bilateral UE Lymphedema risk,   ACTIVITY LEVEL / LEISURE: post op exercises, walking trying to do it daily   OBJECTIVE:   PATIENT SURVEYS:  QUICK DASH:  Quick Dash - 08/09/22 0001     Open a tight or new jar No difficulty    Do heavy household chores (wash walls, wash floors) No difficulty    Carry a shopping bag or briefcase No difficulty    Wash your back No difficulty    Use a knife to cut food No difficulty    Recreational activities in which you take some force or impact through  your arm, shoulder, or hand (golf, hammering, tennis) No difficulty    During the past week, to what extent has your arm, shoulder or hand problem interfered with your normal social activities with family, friends, neighbors, or groups? Not at all    During the past week, to what extent has your arm, shoulder or hand problem limited your work or other regular daily activities Not at all    Arm, shoulder, or hand pain. None    Tingling (pins and needles) in your arm, shoulder, or hand None    Difficulty Sleeping No difficulty    DASH Score 0 %              OBSERVATIONS: Healing bilateral lumpectomy scars, some scar tissue present on R side - educated pt about scar massage  POSTURE:  Forward head and rounded shoulders posture   UPPER EXTREMITY AROM/PROM:   A/PROM RIGHT   eval   RIGHT 08/09/22  Shoulder extension 85 80  Shoulder flexion 167 167  Shoulder abduction 175 178  Shoulder internal rotation 65 71  Shoulder external rotation 85 82                          (Blank rows = not tested)   A/PROM LEFT   eval LEFT 08/09/22  Shoulder extension 74 81  Shoulder flexion 169 171  Shoulder abduction 172 172  Shoulder internal rotation 61 78  Shoulder external rotation 84 85                          (Blank rows = not tested)   CERVICAL AROM: All within normal limits:      Percent limited  Flexion WFL  Extension WFL  Right lateral flexion WFL  Left lateral flexion WFL  Right rotation WFL  Left rotation WFL      UPPER EXTREMITY STRENGTH: 5/5   LYMPHEDEMA ASSESSMENTS:    LANDMARK RIGHT   eval RIGHT 08/09/22  10 cm proximal to olecranon process 34.1 35.7  Olecranon process 29 29.5  10 cm proximal to ulnar styloid process 23.3 24  Just proximal to ulnar styloid process 18.1  18.1  Across hand at thumb web space 19.5 20  At base of 2nd digit 6.8 7  (Blank rows = not tested)   LANDMARK LEFT   eval LEFT 08/09/22  10 cm proximal to olecranon process 35 36  Olecranon  process 28.6 28.6  10 cm proximal to ulnar styloid process 22.3 22.6  Just proximal to ulnar styloid process 17.5 18  Across hand at thumb web space 19.1 19.3  At base of 2nd digit 6.5 6.6  (Blank rows = not tested)  Surgery type/Date: 07/19/22- bilateral lumpectomies with bilateral SLNB R 0/2, L 0/2 Number of lymph nodes removed: 2 on R and 2 on L all negative Current/past treatment (chemo, radiation, hormone therapy): will begin herceptin on Tuesday and then radiation after that Other symptoms:  Heaviness/tightness No Pain No Pitting edema No Infections No Decreased scar mobility No Stemmer sign No  PATIENT EDUCATION:  Education details: scar mobilization, continue ROM exercises, wear compression bra, walking program, ABC class Person educated: Patient Education method: Explanation and Handouts Education comprehension: verbalized understanding  HOME EXERCISE PROGRAM: Reviewed previously given post op HEP. Scar mobilization once scar is completely healed at least 6 weeks  ASSESSMENT:  CLINICAL IMPRESSION: Pt returns to PT after undergoing bilateral lumpectomies and SLNB (0/2 bilaterally) on 07/19/22. Pt has returned to baseline shoulder ROM. She is not having any pain or swelling. Pt will begin herceptin on Tuesday and will undergo radiation. She will be discharged from skilled PT services at this time.   Pt will benefit from skilled therapeutic intervention to improve on the following deficits: Decreased knowledge of precautions, impaired UE functional use, pain, decreased ROM, postural dysfunction.   PT treatment/interventions: ADL/Self care home management,    GOALS: Goals reviewed with patient? Yes  LONG TERM GOALS:  (STG=LTG)  GOALS Name Target Date  Goal status  1 Pt will demonstrate she has regained full shoulder ROM and function post operatively compared to baselines.  Baseline: 08/09/22 MET     PLAN:  PT FREQUENCY/DURATION: d/c this visit  PLAN FOR NEXT  SESSION: d/c this visit   [facility]  [address]  [place] [address]  [phone]  After Breast Cancer Class It is recommended you attend the ABC class to be educated on lymphedema risk reduction. This class is free of charge and lasts for 1 hour. It is a 1-time class. You will need to download the Webex app either on your phone or computer. We will send you a link the night before or the morning of the class. You should be able to click on that link to join the class. This is not a confidential class. You don't have to turn your camera on, but other participants may be able to see your email address.  Scar massage You can begin gentle scar massage to you incision sites. Gently place one hand on the incision and move the skin (without sliding on the skin) in various directions. Do this for a few minutes and then you can gently massage either coconut oil or vitamin E cream into the scars.  Compression garment You should continue wearing your compression bra until you feel like you no longer have swelling.  Home exercise Program Continue doing the exercises you were given until you feel like you can do them without feeling any tightness at the end.   Walking Program Studies show that 30 minutes of walking per day (fast enough to elevate your heart rate) can  significantly reduce the risk of a cancer recurrence. If you can't walk due to other medical reasons, we encourage you to find another activity you could do (like a stationary bike or water exercise).  Posture After breast cancer surgery, people frequently sit with rounded shoulders posture because it puts their incisions on slack and feels better. If you sit like this and scar tissue forms in that position, you can become very tight and have pain sitting or standing with good posture. Try to be aware of your posture and sit and stand up tall to heal properly.   [facility] [place], PT 08/07/2022,  11:32 AM  PHYSICAL THERAPY DISCHARGE SUMMARY  Visits from Start of Care: 2  Current functional level related to goals / functional outcomes: All goals met   Remaining deficits: None   Education / Equipment: HEP, scar mobilization, lymphedema education   Patient agrees to discharge. Patient goals were met. Patient is being discharged due to meeting the stated rehab goals.

## 2022-08-07 NOTE — Progress Notes (Signed)
Pharmacist Chemotherapy Monitoring - Initial Assessment    Anticipated start date: 08/14/22   The following has been reviewed per standard work regarding the patient's treatment regimen: The patient's diagnosis, treatment plan and drug doses, and organ/hematologic function Lab orders and baseline tests specific to treatment regimen  The treatment plan start date, drug sequencing, and pre-medications Prior authorization status  Patient's documented medication list, including drug-drug interaction screen and prescriptions for anti-emetics and supportive care specific to the treatment regimen The drug concentrations, fluid compatibility, administration routes, and timing of the medications to be used The patient's access for treatment and lifetime cumulative dose history, if applicable  The patient's medication allergies and previous infusion related reactions, if applicable   Changes made to treatment plan:  N/A  Follow up needed:  F/u ECHO   Philomena Course, [place], 08/07/2022  8:31 AM

## 2022-08-08 ENCOUNTER — Telehealth: Payer: Self-pay

## 2022-08-08 DIAGNOSIS — Z7189 Other specified counseling: Secondary | ICD-10-CM | POA: Diagnosis not present

## 2022-08-08 DIAGNOSIS — Z0000 Encounter for general adult medical examination without abnormal findings: Secondary | ICD-10-CM | POA: Diagnosis not present

## 2022-08-08 DIAGNOSIS — Z87891 Personal history of nicotine dependence: Secondary | ICD-10-CM | POA: Diagnosis not present

## 2022-08-08 DIAGNOSIS — I700 Atherosclerosis of aorta: Secondary | ICD-10-CM | POA: Diagnosis not present

## 2022-08-08 DIAGNOSIS — C50912 Malignant neoplasm of unspecified site of left female breast: Secondary | ICD-10-CM | POA: Diagnosis not present

## 2022-08-08 DIAGNOSIS — Z299 Encounter for prophylactic measures, unspecified: Secondary | ICD-10-CM | POA: Diagnosis not present

## 2022-08-08 DIAGNOSIS — I10 Essential (primary) hypertension: Secondary | ICD-10-CM | POA: Diagnosis not present

## 2022-08-08 NOTE — Telephone Encounter (Signed)
S2205, ICE COMPRESS: RANDOMIZED TRIAL OF LIMB CRYOCOMPRESSION VERSUS CONTINUOUS COMPRESSION VERSUS LOW CYCLIC COMPRESSION FOR THE PREVENTION  OF TAXANE-INDUCED PERIPHERAL NEUROPATHY     Confirmed the following with patient today: - The patient has not previously received neurotoxic chemotherapy - The patient does not have pre-existing clinical peripheral neuropathy from any cause - The patient does not have a history of Raynaud's phenomenon, cold agglutinin disease, cryoglobulinemia, cryofibrinogenemia, post-traumatic cold dystrophy, or peripheral arterial ischemia.  - The patient does not have any open skin wounds or ulcers of the limbs   Patient agrees to participate in specimen banking.   Patient states they are able to complete PROs in [place].   Patient agreed to complete PROs at all scheduled assessments, and complete the baseline PRO questionnaires within 28 days prior to randomization.  Christy Lynch will be at [facility] for an echo on Monday 08/13/22; our team will pick up her questionnaires/PROs then. Plan also made to do neuro assessments after port flush on Tuesday 08/14/22. Updated appt notes to reflect Paxman use and sent message to infusion room charge RN.  Christy Skiff Delight Bickle, RN, BSN, [facility] She  Her  Hers Clinical Research Nurse [facility] Direct Dial [phone]  Pager [phone] 08/08/2022 10:01 AM

## 2022-08-09 ENCOUNTER — Encounter: Payer: Self-pay | Admitting: Physical Therapy

## 2022-08-09 ENCOUNTER — Ambulatory Visit: Payer: Medicare Other | Attending: General Surgery | Admitting: Physical Therapy

## 2022-08-09 DIAGNOSIS — C50412 Malignant neoplasm of upper-outer quadrant of left female breast: Secondary | ICD-10-CM | POA: Diagnosis not present

## 2022-08-09 DIAGNOSIS — R293 Abnormal posture: Secondary | ICD-10-CM | POA: Insufficient documentation

## 2022-08-09 DIAGNOSIS — Z170 Estrogen receptor positive status [ER+]: Secondary | ICD-10-CM | POA: Diagnosis not present

## 2022-08-13 ENCOUNTER — Telehealth: Payer: Self-pay

## 2022-08-13 ENCOUNTER — Ambulatory Visit (HOSPITAL_COMMUNITY)
Admission: RE | Admit: 2022-08-13 | Discharge: 2022-08-13 | Disposition: A | Discharge status: Home / Self Care | Payer: Medicare Other | Source: Ambulatory Visit | Attending: Hematology and Oncology | Admitting: Hematology and Oncology

## 2022-08-13 ENCOUNTER — Encounter: Payer: Self-pay | Admitting: Hematology and Oncology

## 2022-08-13 DIAGNOSIS — C50919 Malignant neoplasm of unspecified site of unspecified female breast: Secondary | ICD-10-CM | POA: Diagnosis present

## 2022-08-13 DIAGNOSIS — I517 Cardiomegaly: Secondary | ICD-10-CM | POA: Diagnosis not present

## 2022-08-13 DIAGNOSIS — C50412 Malignant neoplasm of upper-outer quadrant of left female breast: Secondary | ICD-10-CM | POA: Insufficient documentation

## 2022-08-13 DIAGNOSIS — I10 Essential (primary) hypertension: Secondary | ICD-10-CM | POA: Insufficient documentation

## 2022-08-13 DIAGNOSIS — Z0189 Encounter for other specified special examinations: Secondary | ICD-10-CM | POA: Diagnosis not present

## 2022-08-13 DIAGNOSIS — R079 Chest pain, unspecified: Secondary | ICD-10-CM | POA: Diagnosis not present

## 2022-08-13 DIAGNOSIS — E785 Hyperlipidemia, unspecified: Secondary | ICD-10-CM | POA: Insufficient documentation

## 2022-08-13 DIAGNOSIS — Z170 Estrogen receptor positive status [ER+]: Secondary | ICD-10-CM

## 2022-08-13 LAB — ECHOCARDIOGRAM COMPLETE
Area-P 1/2: 3.74 cm2
S' Lateral: 2.9 cm

## 2022-08-13 MED FILL — Dexamethasone Sodium Phosphate Inj 100 MG/10ML: INTRAMUSCULAR | Qty: 1 | Status: AC

## 2022-08-13 NOTE — Telephone Encounter (Signed)
S2205, ICE COMPRESS: RANDOMIZED TRIAL OF LIMB CRYOCOMPRESSION VERSUS CONTINUOUS COMPRESSION VERSUS LOW CYCLIC COMPRESSION FOR THE PREVENTION  OF TAXANE-INDUCED PERIPHERAL NEUROPATHY  Spoke with patient about her PROs; she completed her echo this morning before our team could pick them up from her. She sent photos of the completed PROs and will bring the originals tomorrow am. Confirmed no lesions, sores, wounds or ulcers to extremities.  Marjie Skiff Shivaan Tierno, RN, BSN, [facility] She  Her  Hers Clinical Research Nurse [place] [phone]  Pager [phone] 08/13/2022 11:38 AM

## 2022-08-13 NOTE — Research (Signed)
S2205, ICE COMPRESS: RANDOMIZED TRIAL OF LIMB CRYOCOMPRESSION VERSUS CONTINUOUS COMPRESSION VERSUS LOW CYCLIC COMPRESSION FOR THE PREVENTION  OF TAXANE-INDUCED PERIPHERAL NEUROPATHY  SECOND ELIGIBILITY CHECK  This Nurse has reviewed this patient's inclusion and exclusion criteria as a second review and confirms Christy Lynch is eligible for study participation.  Patient may continue with enrollment.  Wells Guiles 'Learta Codding' Neysa Bonito, RN, BSN, [facility] Clinical Research Nurse I 08/13/22 2:33 PM

## 2022-08-13 NOTE — Research (Signed)
S2205, ICE COMPRESS: RANDOMIZED TRIAL OF LIMB CRYOCOMPRESSION VERSUS CONTINUOUS COMPRESSION VERSUS LOW CYCLIC COMPRESSION FOR THE PREVENTION  OF TAXANE-INDUCED PERIPHERAL NEUROPATHY  This Nurse has reviewed this patient's inclusion and exclusion criteria and confirmed Christy Lynch is eligible for study participation.  Patient will continue with enrollment.  Eligibility confirmed by treating investigator, who also agrees that patient should proceed with enrollment.  Marjie Skiff Sister Carbone, RN, BSN, [facility] She  Her  Hers Clinical Research Nurse [facility] Direct Dial [phone]  Pager [phone] 08/13/2022 1:19 PM

## 2022-08-13 NOTE — Progress Notes (Signed)
  Echocardiogram 2D Echocardiogram has been performed.  Christy Lynch 08/13/2022, 8:39 AM

## 2022-08-13 NOTE — Progress Notes (Signed)
Called pt to introduce myself as her Financial Resource Specialist and to discuss the [employer] grant.  I left a msg requesting she return my call if she's interested in applying for the grant.  

## 2022-08-14 ENCOUNTER — Ambulatory Visit: Payer: Medicare Other

## 2022-08-14 ENCOUNTER — Inpatient Hospital Stay: Payer: Medicare Other

## 2022-08-14 ENCOUNTER — Other Ambulatory Visit: Payer: Self-pay

## 2022-08-14 ENCOUNTER — Inpatient Hospital Stay (HOSPITAL_BASED_OUTPATIENT_CLINIC_OR_DEPARTMENT_OTHER): Payer: Medicare Other | Admitting: Hematology and Oncology

## 2022-08-14 ENCOUNTER — Other Ambulatory Visit: Payer: Medicare Other

## 2022-08-14 ENCOUNTER — Inpatient Hospital Stay: Payer: Medicare Other | Attending: Hematology and Oncology

## 2022-08-14 VITALS — BP 160/89 | HR 67 | Temp 98.5°F | Resp 18

## 2022-08-14 VITALS — BP 155/76 | HR 69 | Temp 97.2°F | Resp 18 | Ht 67.0 in | Wt 259.2 lb

## 2022-08-14 DIAGNOSIS — C50412 Malignant neoplasm of upper-outer quadrant of left female breast: Secondary | ICD-10-CM | POA: Diagnosis not present

## 2022-08-14 DIAGNOSIS — Z79620 Long term (current) use of immunosuppressive biologic: Secondary | ICD-10-CM | POA: Insufficient documentation

## 2022-08-14 DIAGNOSIS — Z170 Estrogen receptor positive status [ER+]: Secondary | ICD-10-CM | POA: Diagnosis not present

## 2022-08-14 DIAGNOSIS — D649 Anemia, unspecified: Secondary | ICD-10-CM | POA: Insufficient documentation

## 2022-08-14 DIAGNOSIS — Z95828 Presence of other vascular implants and grafts: Secondary | ICD-10-CM | POA: Insufficient documentation

## 2022-08-14 DIAGNOSIS — Z79899 Other long term (current) drug therapy: Secondary | ICD-10-CM | POA: Diagnosis not present

## 2022-08-14 DIAGNOSIS — R5383 Other fatigue: Secondary | ICD-10-CM | POA: Diagnosis not present

## 2022-08-14 DIAGNOSIS — R197 Diarrhea, unspecified: Secondary | ICD-10-CM | POA: Diagnosis not present

## 2022-08-14 DIAGNOSIS — Z881 Allergy status to other antibiotic agents status: Secondary | ICD-10-CM | POA: Insufficient documentation

## 2022-08-14 DIAGNOSIS — L659 Nonscarring hair loss, unspecified: Secondary | ICD-10-CM | POA: Insufficient documentation

## 2022-08-14 DIAGNOSIS — Z5112 Encounter for antineoplastic immunotherapy: Secondary | ICD-10-CM | POA: Diagnosis not present

## 2022-08-14 DIAGNOSIS — Z5111 Encounter for antineoplastic chemotherapy: Secondary | ICD-10-CM | POA: Insufficient documentation

## 2022-08-14 DIAGNOSIS — G629 Polyneuropathy, unspecified: Secondary | ICD-10-CM | POA: Insufficient documentation

## 2022-08-14 DIAGNOSIS — Z79633 Long term (current) use of mitotic inhibitor: Secondary | ICD-10-CM | POA: Diagnosis not present

## 2022-08-14 DIAGNOSIS — R519 Headache, unspecified: Secondary | ICD-10-CM | POA: Diagnosis not present

## 2022-08-14 LAB — CBC WITH DIFFERENTIAL (CANCER CENTER ONLY)
Abs Immature Granulocytes: 0.02 10*3/uL (ref 0.00–0.07)
Basophils Absolute: 0.1 10*3/uL (ref 0.0–0.1)
Basophils Relative: 1 %
Eosinophils Absolute: 0.5 10*3/uL (ref 0.0–0.5)
Eosinophils Relative: 8 %
HCT: 31.1 % — ABNORMAL LOW (ref 36.0–46.0)
Hemoglobin: 10 g/dL — ABNORMAL LOW (ref 12.0–15.0)
Immature Granulocytes: 0 %
Lymphocytes Relative: 27 %
Lymphs Abs: 1.9 10*3/uL (ref 0.7–4.0)
MCH: 27.8 pg (ref 26.0–34.0)
MCHC: 32.2 g/dL (ref 30.0–36.0)
MCV: 86.4 fL (ref 80.0–100.0)
Monocytes Absolute: 0.6 10*3/uL (ref 0.1–1.0)
Monocytes Relative: 8 %
Neutro Abs: 4 10*3/uL (ref 1.7–7.7)
Neutrophils Relative %: 56 %
Platelet Count: 284 10*3/uL (ref 150–400)
RBC: 3.6 MIL/uL — ABNORMAL LOW (ref 3.87–5.11)
RDW: 14.6 % (ref 11.5–15.5)
WBC Count: 7.1 10*3/uL (ref 4.0–10.5)
nRBC: 0 % (ref 0.0–0.2)

## 2022-08-14 LAB — CMP (CANCER CENTER ONLY)
ALT: 20 U/L (ref 0–44)
AST: 23 U/L (ref 15–41)
Albumin: 3.8 g/dL (ref 3.5–5.0)
Alkaline Phosphatase: 91 U/L (ref 38–126)
Anion gap: 6 (ref 5–15)
BUN: 13 mg/dL (ref 8–23)
CO2: 27 mmol/L (ref 22–32)
Calcium: 9.5 mg/dL (ref 8.9–10.3)
Chloride: 107 mmol/L (ref 98–111)
Creatinine: 1 mg/dL (ref 0.44–1.00)
GFR, Estimated: >60 mL/min (ref >60)
Glucose, Bld: 95 mg/dL (ref 70–99)
Potassium: 3.7 mmol/L (ref 3.5–5.1)
Sodium: 140 mmol/L (ref 135–145)
Total Bilirubin: 0.6 mg/dL (ref 0.3–1.2)
Total Protein: 7 g/dL (ref 6.5–8.1)

## 2022-08-14 MED ORDER — SODIUM CHLORIDE 0.9 % IV SOLN
80.0000 mg/m2 | Freq: Once | INTRAVENOUS | Status: AC
  Administered 2022-08-14: 186 mg via INTRAVENOUS
  Filled 2022-08-14: qty 31

## 2022-08-14 MED ORDER — SODIUM CHLORIDE 0.9 % IV SOLN: Freq: Once | INTRAVENOUS | Status: AC

## 2022-08-14 MED ORDER — FAMOTIDINE IN NACL 20-0.9 MG/50ML-% IV SOLN
20.0000 mg | Freq: Once | INTRAVENOUS | Status: AC
  Administered 2022-08-14: 20 mg via INTRAVENOUS
  Filled 2022-08-14: qty 50

## 2022-08-14 MED ORDER — SODIUM CHLORIDE 0.9% FLUSH
10.0000 mL | INTRAVENOUS | Status: DC | PRN
  Administered 2022-08-14: 10 mL

## 2022-08-14 MED ORDER — ACETAMINOPHEN 325 MG PO TABS
650.0000 mg | ORAL_TABLET | Freq: Once | ORAL | Status: AC
  Administered 2022-08-14: 650 mg via ORAL
  Filled 2022-08-14: qty 2

## 2022-08-14 MED ORDER — HEPARIN SOD (PORK) LOCK FLUSH 100 UNIT/ML IV SOLN
500.0000 [IU] | Freq: Once | INTRAVENOUS | Status: AC | PRN
  Administered 2022-08-14: 500 [IU]

## 2022-08-14 MED ORDER — SODIUM CHLORIDE 0.9 % IV SOLN
10.0000 mg | Freq: Once | INTRAVENOUS | Status: AC
  Administered 2022-08-14: 10 mg via INTRAVENOUS
  Filled 2022-08-14: qty 10

## 2022-08-14 MED ORDER — TRASTUZUMAB-ANNS CHEMO 150 MG IV SOLR
4.0000 mg/kg | Freq: Once | INTRAVENOUS | Status: AC
  Administered 2022-08-14: 462 mg via INTRAVENOUS
  Filled 2022-08-14: qty 22

## 2022-08-14 MED ORDER — DIPHENHYDRAMINE HCL 50 MG/ML IJ SOLN
50.0000 mg | Freq: Once | INTRAMUSCULAR | Status: AC
  Administered 2022-08-14: 50 mg via INTRAVENOUS
  Filled 2022-08-14: qty 1

## 2022-08-14 NOTE — Assessment & Plan Note (Signed)
04/30/2022: Screening mammogram detected left breast mass: Diagnostic mammogram and ultrasound revealed 2 o'clock position left breast irregular hypoechoic mass with calcification measuring 0.7 cm. Adjacent to this another irregular hypoechoic mass 0.5 cm. Axilla negative. Biopsy revealed grade 3 IDC with DCIS ER 100%, PR 100%, Ki-67 60%, HER2 3+ positive    07/19/2022: Right lumpectomy: Grade 2 IDC, 1 cm with intermediate grade DCIS, ER 95%, PR 90%,, Ki-67 5%, HER2 -, 0/2 lymph nodes negative, anterior margin positive (it is skin so therefore no additional surgery is needed) Left lumpectomy: Grade 3 IDC 1.1 cm with DCIS, margins negative, 0/2 lymph nodes negative, ER 100%, PR 100%, HER2 positive 3+, Ki-67 60% ------------------------------------------------------------------------------------------------------------------------------------------------ Treatment plan: Adjuvant chemotherapy with Taxol Herceptin followed by Herceptin maintenance Adjuvant radiation Adjuvant antiestrogen therapy ------------------------------------------------------------------------ current treatment: Cycle 1 Taxol Herceptin Labs reviewed Anemia: Hemoglobin of 10: Probably related to prior surgery.  We will watch and monitor for now.  Patient is enrolled in neuropathy trial Return to clinic in 1 week for cycle 2 NP for toxicity check

## 2022-08-14 NOTE — Research (Unsigned)
TRIAL S2205, ICE COMPRESS: RANDOMIZED TRIAL OF LIMB CRYOCOMPRESSION VERSUS CONTINUOUS COMPRESSION VERSUS LOW CYCLIC COMPRESSION FOR THE PREVENTION  OF TAXANE-INDUCED PERIPHERAL NEUROPATHY   Patient arrives today Accompanied by her husband  for the Week 1 visit. Confirmed no lesions or wounds to extremities. Patient has edema to BLE, which is unusual for her.   PROs: Per study protocol, all PROs required for this visit were completed prior to other study activities and completeness has been verified.     LABS: Mandatory and optional labs are collected per consent and study protocol: Patient [place] tolerated well without complaint.   MEDICATION REVIEW: Patient reviews and verifies the current medication list is correct.  MD/PROVIDER VISIT: Patient sees Dr Lindi Adie for today's visit.   ADVERSE EVENTS: Patient [name] reports no AEs; this is her first visit.  NEURO ASSESSMENT: The neuro assessment was completed by this nurse. Patient [place] C Marti tolerated all testing without complaint.   [place] applied to all 4 extremities, pre-treatment started 1429 Scheduled check; no complaints or changes 1441 Scheduled check; no complaints or changes 1452 Pre-treatment done; waiting on Taxane to arrive; pt remains on pre-treatment setting 1503 Taxane started; moved to treatment phase on Paxman device 1513 Scheduled check; no complaints or changes 1520 Scheduled check; no complaints or changes 1553 Scheduled check; no complaints or changes 1603 Pt asleep 1610 Device check; no complaints or changes 1642 Taxane done; post-treatment phase started 1657 Device check; no complaints or changes 1712 Post-treatment complete. No wounds/lesions/redness. Assisted patient to restroom.   DISPOSITION: Upon completion off all study requirements, patient remained in infusion to receive her AVS.   The patient was thanked for their time and continued voluntary participation in  this study. Patient ABRIYA LATTANZI has been provided direct contact information and is encouraged to contact this Nurse for any needs or questions.  Marjie Skiff Zooey Schreurs, RN, BSN, [facility] She  Her  Hers Clinical Research Nurse [facility] Direct Dial [phone]  Pager [phone] 08/15/2022 4:04 PM

## 2022-08-14 NOTE — Progress Notes (Signed)
Patient Care Team: Glenda Chroman, MD as PCP - General (Internal Medicine) Nicholas Lose, MD as Consulting Physician (Hematology and Oncology) Mauro Kaufmann, RN as Oncology Nurse Navigator Rockwell Germany, RN as Oncology Nurse Navigator  DIAGNOSIS:  Encounter Diagnosis  Name Primary?   Malignant neoplasm of upper-outer quadrant of left breast in female, estrogen receptor positive Yes    SUMMARY OF ONCOLOGIC HISTORY: Oncology History  Malignant neoplasm of upper-outer quadrant of left breast in female, estrogen receptor positive  04/30/2022 Initial Diagnosis   Screening mammogram to treat left breast mass measuring 0.7 cm, adjacent mass 0.5 cm, biopsy of both revealed grade 3 IDC with DCIS ER 100% PR 100% Ki-67 60%, HER2 3+ positive   05/25/2022 Cancer Staging   Staging form: Breast, AJCC 8th Edition - Clinical: Stage IA (cT1b, cN0, cM0, G3, ER+, PR+, HER2+) - Signed by Nicholas Lose, MD on 05/25/2022 Stage prefix: Initial diagnosis Histologic grading system: 3 grade system   07/19/2022 Surgery   Right lumpectomy: Grade 2 IDC, 1 cm with intermediate grade DCIS, ER 95%, PR 90%,, Ki-67 5%, HER2 -, 0/2 lymph nodes negative, anterior margin positive Left lumpectomy: Grade 3 IDC 1.1 cm with DCIS, margins negative, 0/2 lymph nodes negative, ER 100%, PR 100%, HER2 positive 3+, Ki-67 60%   07/29/2022 Genetic Testing   Negative genetic testing on the CancerNext-Expanded+RNAinsight panel.  The report date is July 29, 2022.  The CancerNext-Expanded gene panel offered by [facility] and includes sequencing and rearrangement analysis for the following 77 genes: AIP, ALK, APC*, ATM*, AXIN2, BAP1, BARD1, BMPR1A, BRCA1*, BRCA2*, BRIP1*, CDC73, CDH1*, CDK4, CDKN1B, CDKN2A, CHEK2*, CTNNA1, DICER1, FH, FLCN, KIF1B, LZTR1, MAX, MEN1, MET, MLH1*, MSH2*, MSH3, MSH6*, MUTYH*, NF1*, NF2, NTHL1, PALB2*, PHOX2B, PMS2*, POT1, PRKAR1A, PTCH1, PTEN*, RAD51C*, RAD51D*, RB1, RET, SDHA, SDHAF2, SDHB, SDHC, SDHD,  SMAD4, SMARCA4, SMARCB1, SMARCE1, STK11, SUFU, TMEM127, TP53*, TSC1, TSC2, and VHL (sequencing and deletion/duplication); EGFR, EGLN1, HOXB13, KIT, MITF, PDGFRA, POLD1, and POLE (sequencing only); EPCAM and GREM1 (deletion/duplication only). DNA and RNA analyses performed for * genes.    08/13/2022 -  Chemotherapy   Patient is on Treatment Plan : BREAST Paclitaxel + Trastuzumab q7d / Trastuzumab q21d       CHIEF COMPLIANT: Follow-up after surgery  INTERVAL HISTORY: Christy Lynch is a 67 y.o. female is here because of recent diagnosis of left breast cancer.   She reports that everything is fine. She had a bad headache on yesterday but it has subsided. She denies any bleeding from the anemia.  ALLERGIES:  is allergic to flagyl [metronidazole].  MEDICATIONS:  Current Outpatient Medications  Medication Sig Dispense Refill   chlorhexidine (PERIDEX) 0.12 % solution 15 mLs 2 (two) times daily.     aspirin EC 81 MG tablet Take 81 mg by mouth daily. Swallow whole. (Patient not taking: Reported on 08/14/2022)     atenolol (TENORMIN) 50 MG tablet Take 50 mg by mouth every evening.     DULoxetine (CYMBALTA) 60 MG capsule Take 60 mg by mouth every evening.     esomeprazole (NEXIUM) 40 MG capsule Take 40 mg by mouth daily at 12 noon.     lidocaine-prilocaine (EMLA) cream Apply to affected area once (Patient not taking: Reported on 08/07/2022) 30 g 3   lisinopril-hydrochlorothiazide (ZESTORETIC) 20-25 MG tablet Take 1 tablet by mouth daily.     Multiple Vitamin (MULTIVITAMIN WITH MINERALS) TABS tablet Take 1 tablet by mouth every evening.     ondansetron (ZOFRAN) 8 MG  tablet Take 1 tablet (8 mg total) by mouth every 8 (eight) hours as needed for nausea or vomiting. (Patient not taking: Reported on 08/07/2022) 30 tablet 1   oxyCODONE (OXY IR/ROXICODONE) 5 MG immediate release tablet Take 1 tablet (5 mg total) by mouth every 6 (six) hours as needed for severe pain. 15 tablet 0   prochlorperazine  (COMPAZINE) 10 MG tablet Take 1 tablet (10 mg total) by mouth every 6 (six) hours as needed for nausea or vomiting. (Patient not taking: Reported on 08/07/2022) 30 tablet 1   No current facility-administered medications for this visit.    PHYSICAL EXAMINATION: ECOG PERFORMANCE STATUS: 1 - Symptomatic but completely ambulatory  Vitals:   08/14/22 1124  BP: (!) 155/76  Pulse: 69  Resp: 18  Temp: (!) 97.2 F (36.2 C)  SpO2: 98%   Filed Weights   08/14/22 1124  Weight: 259 lb 3.2 oz (117.6 kg)     LABORATORY DATA:  I have reviewed the data as listed    Latest Ref Rng & Units 08/14/2022   10:41 AM 07/13/2022    2:55 PM 06/11/2022    8:40 AM  CMP  Glucose 70 - 99 mg/dL 95  112  97   BUN 8 - 23 mg/dL 13  20  16    Creatinine 0.44 - 1.00 mg/dL 1.00  1.15  1.15   Sodium 135 - 145 mmol/L 140  136  137   Potassium 3.5 - 5.1 mmol/L 3.7  3.6  3.8   Chloride 98 - 111 mmol/L 107  103  100   CO2 22 - 32 mmol/L 27  25  25    Calcium 8.9 - 10.3 mg/dL 9.5  9.4  9.7   Total Protein 6.5 - 8.1 g/dL 7.0     Total Bilirubin 0.3 - 1.2 mg/dL 0.6     Alkaline Phos 38 - 126 U/L 91     AST 15 - 41 U/L 23     ALT 0 - 44 U/L 20       Lab Results  Component Value Date   WBC 7.1 08/14/2022   HGB 10.0 (L) 08/14/2022   HCT 31.1 (L) 08/14/2022   MCV 86.4 08/14/2022   PLT 284 08/14/2022   NEUTROABS 4.0 08/14/2022    ASSESSMENT & PLAN:  Malignant neoplasm of upper-outer quadrant of left breast in female, estrogen receptor positive ([place]) 04/30/2022: Screening mammogram detected left breast mass: Diagnostic mammogram and ultrasound revealed 2 o'clock position left breast irregular hypoechoic mass with calcification measuring 0.7 cm. Adjacent to this another irregular hypoechoic mass 0.5 cm. Axilla negative. Biopsy revealed grade 3 IDC with DCIS ER 100%, PR 100%, Ki-67 60%, HER2 3+ positive    07/19/2022: Right lumpectomy: Grade 2 IDC, 1 cm with intermediate grade DCIS, ER 95%, PR 90%,, Ki-67 5%, HER2 -, 0/2  lymph nodes negative, anterior margin positive (it is skin so therefore no additional surgery is needed) Left lumpectomy: Grade 3 IDC 1.1 cm with DCIS, margins negative, 0/2 lymph nodes negative, ER 100%, PR 100%, HER2 positive 3+, Ki-67 60% ------------------------------------------------------------------------------------------------------------------------------------------------ Treatment plan: Adjuvant chemotherapy with Taxol Herceptin followed by Herceptin maintenance Adjuvant radiation Adjuvant antiestrogen therapy ------------------------------------------------------------------------ current treatment: Cycle 1 Taxol Herceptin Labs reviewed Anemia: Hemoglobin of 10: Probably related to prior surgery.  We will watch and monitor for now.  Patient is enrolled in neuropathy trial Return to clinic in 1 week for cycle 2 NP for toxicity check    No orders of the defined types were  placed in this encounter.  The patient has a good understanding of the overall plan. she agrees with it. she will call with any problems that may develop before the next visit here. Total time spent: 30 mins including face to face time and time spent for planning, charting and co-ordination of care   Harriette Ohara, MD 08/14/22    I Gardiner Coins am acting as a Education administrator for [employer]  I have reviewed the above documentation for accuracy and completeness, and I agree with the above.

## 2022-08-14 NOTE — Patient Instructions (Signed)
[facility]  Discharge Instructions: Thank you for choosing [facility] to provide your oncology and hematology care.   If you have a lab appointment with the [facility], please go directly to the [facility] and check in at the registration area.   Wear comfortable clothing and clothing appropriate for easy access to any Portacath or PICC line.   We strive to give you quality time with your provider. You may need to reschedule your appointment if you arrive late (15 or more minutes).  Arriving late affects you and other patients whose appointments are after yours.  Also, if you miss three or more appointments without notifying the office, you may be dismissed from the clinic at the provider's discretion.      For prescription refill requests, have your pharmacy contact our office and allow 72 hours for refills to be completed.    Today you received the following chemotherapy and/or immunotherapy agents Kanjinti and Paclitaxel      To help prevent nausea and vomiting after your treatment, we encourage you to take your nausea medication as directed.  BELOW ARE SYMPTOMS THAT SHOULD BE REPORTED IMMEDIATELY: *FEVER GREATER THAN 100.4 F (38 C) OR HIGHER *CHILLS OR SWEATING *NAUSEA AND VOMITING THAT IS NOT CONTROLLED WITH YOUR NAUSEA MEDICATION *UNUSUAL SHORTNESS OF BREATH *UNUSUAL BRUISING OR BLEEDING *URINARY PROBLEMS (pain or burning when urinating, or frequent urination) *BOWEL PROBLEMS (unusual diarrhea, constipation, pain near the anus) TENDERNESS IN MOUTH AND THROAT WITH OR WITHOUT PRESENCE OF ULCERS (sore throat, sores in mouth, or a toothache) UNUSUAL RASH, SWELLING OR PAIN  UNUSUAL VAGINAL DISCHARGE OR ITCHING   Items with * indicate a potential emergency and should be followed up as soon as possible or go to the Emergency Department if any problems should occur.  Please show the CHEMOTHERAPY ALERT CARD or IMMUNOTHERAPY ALERT  CARD at check-in to the Emergency Department and triage nurse.  Should you have questions after your visit or need to cancel or reschedule your appointment, please contact [facility]  Dept: [phone]  and follow the prompts.  Office hours are 8:00 a.m. to 4:30 p.m. Monday - Friday. Please note that voicemails left after 4:00 p.m. may not be returned until the following business day.  We are closed weekends and major holidays. You have access to a nurse at all times for urgent questions. Please call the main number to the clinic Dept: [phone] and follow the prompts.   For any non-urgent questions, you may also contact your provider using MyChart. We now offer e-Visits for anyone 25 and older to request care online for non-urgent symptoms. For details visit [email or web address].   Also download the MyChart app! Go to the app store, search "MyChart", open the app, select [facility], and log in with your MyChart username and password.  Trastuzumab Injection What is this medication? TRASTUZUMAB (tras TOO zoo mab) treats breast cancer and stomach cancer. It works by blocking a protein that causes cancer cells to grow and multiply. This helps to slow or stop the spread of cancer cells. This medicine may be used for other purposes; ask your health care provider or pharmacist if you have questions. COMMON BRAND NAME(S): Herceptin, Janae Bridgeman, Ontruzant, Trazimera What should I tell my care team before I take this medication? They need to know if you have any of these conditions: Heart failure Lung disease An unusual or allergic reaction to trastuzumab,  other medications, foods, dyes, or preservatives Pregnant or trying to get pregnant Breast-feeding How should I use this medication? This medication is injected into a vein. It is given by your care team in a hospital or clinic setting. Talk to your care team about the use of this  medication in children. It is not approved for use in children. Overdosage: If you think you have taken too much of this medicine contact a poison control center or emergency room at once. NOTE: This medicine is only for you. Do not share this medicine with others. What if I miss a dose? Keep appointments for follow-up doses. It is important not to miss your dose. Call your care team if you are unable to keep an appointment. What may interact with this medication? Certain types of chemotherapy, such as daunorubicin, doxorubicin, epirubicin, idarubicin This list may not describe all possible interactions. Give your health care provider a list of all the medicines, herbs, non-prescription drugs, or dietary supplements you use. Also tell them if you smoke, drink alcohol, or use illegal drugs. Some items may interact with your medicine. What should I watch for while using this medication? Your condition will be monitored carefully while you are receiving this medication. This medication may make you feel generally unwell. This is not uncommon, as chemotherapy affects healthy cells as well as cancer cells. Report any side effects. Continue your course of treatment even though you feel ill unless your care team tells you to stop. This medication may increase your risk of getting an infection. Call your care team for advice if you get a fever, chills, sore throat, or other symptoms of a cold or flu. Do not treat yourself. Try to avoid being around people who are sick. Avoid taking medications that contain aspirin, acetaminophen, ibuprofen, naproxen, or ketoprofen unless instructed by your care team. These medications can hide a fever. Talk to your care team if you may be pregnant. Serious birth defects can occur if you take this medication during pregnancy and for 7 months after the last dose. You will need a negative pregnancy test before starting this medication. Contraception is recommended while taking  this medication and for 7 months after the last dose. Your care team can help you find the option that works for you. Do not breastfeed while taking this medication and for 7 months after stopping treatment. What side effects may I notice from receiving this medication? Side effects that you should report to your care team as soon as possible: Allergic reactions or angioedema--skin rash, itching or hives, swelling of the face, eyes, lips, tongue, arms, or legs, trouble swallowing or breathing Dry cough, shortness of breath or trouble breathing Heart failure--shortness of breath, swelling of the ankles, feet, or hands, sudden weight gain, unusual weakness or fatigue Infection--fever, chills, cough, or sore throat Infusion reactions--chest pain, shortness of breath or trouble breathing, feeling faint or lightheaded Side effects that usually do not require medical attention (report to your care team if they continue or are bothersome): Diarrhea Dizziness Headache Nausea Trouble sleeping Vomiting This list may not describe all possible side effects. Call your doctor for medical advice about side effects. You may report side effects to FDA at 1-800-FDA-1088. Where should I keep my medication? This medication is given in a hospital or clinic. It will not be stored at home. NOTE: This sheet is a summary. It may not cover all possible information. If you have questions about this medicine, talk to your doctor, pharmacist, or  health care provider.  2023 Elsevier/Gold Standard (2021-08-31 00:00:00)  Paclitaxel Injection What is this medication? PACLITAXEL (PAK li TAX el) treats some types of cancer. It works by slowing down the growth of cancer cells. This medicine may be used for other purposes; ask your health care provider or pharmacist if you have questions. COMMON BRAND NAME(S): Onxol, Taxol What should I tell my care team before I take this medication? They need to know if you have any of  these conditions: Heart disease Liver disease Low white blood cell levels An unusual or allergic reaction to paclitaxel, other medications, foods, dyes, or preservatives If you or your partner are pregnant or trying to get pregnant Breast-feeding How should I use this medication? This medication is injected into a vein. It is given by your care team in a hospital or clinic setting. Talk to your care team about the use of this medication in children. While it may be given to children for selected conditions, precautions do apply. Overdosage: If you think you have taken too much of this medicine contact a poison control center or emergency room at once. NOTE: This medicine is only for you. Do not share this medicine with others. What if I miss a dose? Keep appointments for follow-up doses. It is important not to miss your dose. Call your care team if you are unable to keep an appointment. What may interact with this medication? Do not take this medication with any of the following: Live virus vaccines Other medications may affect the way this medication works. Talk with your care team about all of the medications you take. They may suggest changes to your treatment plan to lower the risk of side effects and to make sure your medications work as intended. This list may not describe all possible interactions. Give your health care provider a list of all the medicines, herbs, non-prescription drugs, or dietary supplements you use. Also tell them if you smoke, drink alcohol, or use illegal drugs. Some items may interact with your medicine. What should I watch for while using this medication? Your condition will be monitored carefully while you are receiving this medication. You may need blood work while taking this medication. This medication may make you feel generally unwell. This is not uncommon as chemotherapy can affect healthy cells as well as cancer cells. Report any side effects. Continue your  course of treatment even though you feel ill unless your care team tells you to stop. This medication can cause serious allergic reactions. To reduce the risk, your care team may give you other medications to take before receiving this one. Be sure to follow the directions from your care team. This medication may increase your risk of getting an infection. Call your care team for advice if you get a fever, chills, sore throat, or other symptoms of a cold or flu. Do not treat yourself. Try to avoid being around people who are sick. This medication may increase your risk to bruise or bleed. Call your care team if you notice any unusual bleeding. Be careful brushing or flossing your teeth or using a toothpick because you may get an infection or bleed more easily. If you have any dental work done, tell your dentist you are receiving this medication. Talk to your care team if you may be pregnant. Serious birth defects can occur if you take this medication during pregnancy. Talk to your care team before breastfeeding. Changes to your treatment plan may be needed. What side effects  may I notice from receiving this medication? Side effects that you should report to your care team as soon as possible: Allergic reactions--skin rash, itching, hives, swelling of the face, lips, tongue, or throat Heart rhythm changes--fast or irregular heartbeat, dizziness, feeling faint or lightheaded, chest pain, trouble breathing Increase in blood pressure Infection--fever, chills, cough, sore throat, wounds that don't heal, pain or trouble when passing urine, general feeling of discomfort or being unwell Low blood pressure--dizziness, feeling faint or lightheaded, blurry vision Low red blood cell level--unusual weakness or fatigue, dizziness, headache, trouble breathing Painful swelling, warmth, or redness of the skin, blisters or sores at the infusion site Pain, tingling, or numbness in the hands or feet Slow  heartbeat--dizziness, feeling faint or lightheaded, confusion, trouble breathing, unusual weakness or fatigue Unusual bruising or bleeding Side effects that usually do not require medical attention (report to your care team if they continue or are bothersome): Diarrhea Hair loss Joint pain Loss of appetite Muscle pain Nausea Vomiting This list may not describe all possible side effects. Call your doctor for medical advice about side effects. You may report side effects to FDA at 1-800-FDA-1088. Where should I keep my medication? This medication is given in a hospital or clinic. It will not be stored at home. NOTE: This sheet is a summary. It may not cover all possible information. If you have questions about this medicine, talk to your doctor, pharmacist, or health care provider.  2023 Elsevier/Gold Standard (2021-09-14 00:00:00)

## 2022-08-15 ENCOUNTER — Encounter: Payer: Self-pay | Admitting: Hematology and Oncology

## 2022-08-15 NOTE — Telephone Encounter (Signed)
Called pt to see how she did with her treatment & she reports doing well.  She reports not sleeping great last night presumably from steroids.  She also reports a h/a from ondansetron.  Encouraged to try compazine to see if that works without giving her a h/a.  She did take compazine last night before bed but didn't help her sleep.  She will try compazine again.  She threw up after eating her dinner meal yest & took zofran.  She hasn't had any other vomiting but took zofran before she ate next time.  She knows her next appt & how to reach [place] if needed.

## 2022-08-15 NOTE — Telephone Encounter (Signed)
-----   Message from Odette Fraction, [place] sent at 08/14/2022  5:02 PM EDT ----- Regarding: First time Kanjinti/Paclitaxel Pt of Dr Lindi Adie, first time Kanjinti and Paclitaxel. Tolerated well. On research study with [facility]. Vitals consistent throughout. Please follow up, thanks!!

## 2022-08-16 ENCOUNTER — Encounter (HOSPITAL_COMMUNITY): Payer: Self-pay

## 2022-08-20 ENCOUNTER — Encounter: Payer: Self-pay | Admitting: Hematology and Oncology

## 2022-08-20 MED FILL — Dexamethasone Sodium Phosphate Inj 100 MG/10ML: INTRAMUSCULAR | Qty: 1 | Status: AC

## 2022-08-20 NOTE — Progress Notes (Signed)
Patient Care Team: [name], MD as PCP - General (Internal Medicine) [name], MD as Consulting Physician (Hematology and Oncology) Pershing Proud, RN as Oncology Nurse Navigator Donnelly Angelica, RN as Oncology Nurse Navigator  DIAGNOSIS: No diagnosis found.  SUMMARY OF ONCOLOGIC HISTORY: Oncology History  Malignant neoplasm of upper-outer quadrant of left breast in female, estrogen receptor positive  04/30/2022 Initial Diagnosis   Screening mammogram to treat left breast mass measuring 0.7 cm, adjacent mass 0.5 cm, biopsy of both revealed grade 3 IDC with DCIS ER 100% PR 100% Ki-67 60%, HER2 3+ positive   05/25/2022 Cancer Staging   Staging form: Breast, AJCC 8th Edition - Clinical: Stage IA (cT1b, cN0, cM0, G3, ER+, PR+, HER2+) - Signed by [name], MD on 05/25/2022 Stage prefix: Initial diagnosis Histologic grading system: 3 grade system   07/19/2022 Surgery   Right lumpectomy: Grade 2 IDC, 1 cm with intermediate grade DCIS, ER 95%, PR 90%,, Ki-67 5%, HER2 -, 0/2 lymph nodes negative, anterior margin positive Left lumpectomy: Grade 3 IDC 1.1 cm with DCIS, margins negative, 0/2 lymph nodes negative, ER 100%, PR 100%, HER2 positive 3+, Ki-67 60%   07/29/2022 Genetic Testing   Negative genetic testing on the CancerNext-Expanded+RNAinsight panel.  The report date is July 29, 2022.  The CancerNext-Expanded gene panel offered by [facility] and includes sequencing and rearrangement analysis for the following 77 genes: AIP, ALK, APC*, ATM*, AXIN2, BAP1, BARD1, BMPR1A, BRCA1*, BRCA2*, BRIP1*, CDC73, CDH1*, CDK4, CDKN1B, CDKN2A, CHEK2*, CTNNA1, DICER1, FH, FLCN, KIF1B, LZTR1, MAX, MEN1, MET, MLH1*, MSH2*, MSH3, MSH6*, MUTYH*, NF1*, NF2, NTHL1, PALB2*, PHOX2B, PMS2*, POT1, PRKAR1A, PTCH1, PTEN*, RAD51C*, RAD51D*, RB1, RET, SDHA, SDHAF2, SDHB, SDHC, SDHD, SMAD4, SMARCA4, SMARCB1, SMARCE1, STK11, SUFU, TMEM127, TP53*, TSC1, TSC2, and VHL (sequencing and deletion/duplication);  EGFR, EGLN1, HOXB13, KIT, MITF, PDGFRA, POLD1, and POLE (sequencing only); EPCAM and GREM1 (deletion/duplication only). DNA and RNA analyses performed for * genes.    08/14/2022 -  Chemotherapy   Patient is on Treatment Plan : BREAST Paclitaxel + Trastuzumab q7d / Trastuzumab q21d       CHIEF COMPLIANT:   INTERVAL HISTORY: Christy Lynch is a   ALLERGIES:  is allergic to flagyl [metronidazole].  MEDICATIONS:  Current Outpatient Medications  Medication Sig Dispense Refill   aspirin EC 81 MG tablet Take 81 mg by mouth daily. Swallow whole. (Patient not taking: Reported on 08/14/2022)     atenolol (TENORMIN) 50 MG tablet Take 50 mg by mouth every evening.     chlorhexidine (PERIDEX) 0.12 % solution 15 mLs 2 (two) times daily.     DULoxetine (CYMBALTA) 60 MG capsule Take 60 mg by mouth every evening.     esomeprazole (NEXIUM) 40 MG capsule Take 40 mg by mouth daily at 12 noon.     lidocaine-prilocaine (EMLA) cream Apply to affected area once (Patient not taking: Reported on 08/07/2022) 30 g 3   lisinopril-hydrochlorothiazide (ZESTORETIC) 20-25 MG tablet Take 1 tablet by mouth daily.     Multiple Vitamin (MULTIVITAMIN WITH MINERALS) TABS tablet Take 1 tablet by mouth every evening.     ondansetron (ZOFRAN) 8 MG tablet Take 1 tablet (8 mg total) by mouth every 8 (eight) hours as needed for nausea or vomiting. (Patient not taking: Reported on 08/07/2022) 30 tablet 1   oxyCODONE (OXY IR/ROXICODONE) 5 MG immediate release tablet Take 1 tablet (5 mg total) by mouth every 6 (six) hours as needed for severe pain. 15 tablet 0   prochlorperazine (  COMPAZINE) 10 MG tablet Take 1 tablet (10 mg total) by mouth every 6 (six) hours as needed for nausea or vomiting. (Patient not taking: Reported on 08/07/2022) 30 tablet 1   No current facility-administered medications for this visit.    PHYSICAL EXAMINATION: ECOG PERFORMANCE STATUS: {CHL ONC ECOG PS:[phone]}  There were no vitals filed for this  visit. There were no vitals filed for this visit.  BREAST:*** No palpable masses or nodules in either right or left breasts. No palpable axillary supraclavicular or infraclavicular adenopathy no breast tenderness or nipple discharge. (exam performed in the presence of a chaperone)  LABORATORY DATA:  I have reviewed the data as listed    Latest Ref Rng & Units 08/14/2022   10:41 AM 07/13/2022    2:55 PM 06/11/2022    8:40 AM  CMP  Glucose 70 - 99 mg/dL 95  638  97   BUN 8 - 23 mg/dL 13  20  16    Creatinine 0.44 - 1.00 mg/dL 4.66  5.99  3.57   Sodium 135 - 145 mmol/L 140  136  137   Potassium 3.5 - 5.1 mmol/L 3.7  3.6  3.8   Chloride 98 - 111 mmol/L 107  103  100   CO2 22 - 32 mmol/L 27  25  25    Calcium 8.9 - 10.3 mg/dL 9.5  9.4  9.7   Total Protein 6.5 - 8.1 g/dL 7.0     Total Bilirubin 0.3 - 1.2 mg/dL 0.6     Alkaline Phos 38 - 126 U/L 91     AST 15 - 41 U/L 23     ALT 0 - 44 U/L 20       Lab Results  Component Value Date   WBC 7.1 08/14/2022   HGB 10.0 (L) 08/14/2022   HCT 31.1 (L) 08/14/2022   MCV 86.4 08/14/2022   PLT 284 08/14/2022   NEUTROABS 4.0 08/14/2022    ASSESSMENT & PLAN:  No problem-specific Assessment & Plan notes found for this encounter.    No orders of the defined types were placed in this encounter.  The patient has a good understanding of the overall plan. she agrees with it. she will call with any problems that may develop before the next visit here. Total time spent: 30 mins including face to face time and time spent for planning, charting and co-ordination of care   Sherlyn Lick, CMA 08/20/22    I Janan Ridge am acting as a Neurosurgeon for [employer]  ***

## 2022-08-20 NOTE — Progress Notes (Signed)
Pt returned my call to apply for the [employer].  I gave her the income requirement but she exceeds the amount so she doesn't qualify for the grant at this time.

## 2022-08-21 ENCOUNTER — Inpatient Hospital Stay (HOSPITAL_BASED_OUTPATIENT_CLINIC_OR_DEPARTMENT_OTHER): Payer: Medicare Other | Admitting: Hematology and Oncology

## 2022-08-21 ENCOUNTER — Inpatient Hospital Stay: Payer: Medicare Other

## 2022-08-21 ENCOUNTER — Encounter: Payer: Self-pay | Admitting: Emergency Medicine

## 2022-08-21 VITALS — BP 158/67 | HR 70 | Temp 97.9°F | Resp 18 | Ht 67.0 in | Wt 250.1 lb

## 2022-08-21 DIAGNOSIS — Z170 Estrogen receptor positive status [ER+]: Secondary | ICD-10-CM | POA: Diagnosis not present

## 2022-08-21 DIAGNOSIS — Z5111 Encounter for antineoplastic chemotherapy: Secondary | ICD-10-CM | POA: Diagnosis not present

## 2022-08-21 DIAGNOSIS — G629 Polyneuropathy, unspecified: Secondary | ICD-10-CM | POA: Diagnosis not present

## 2022-08-21 DIAGNOSIS — Z881 Allergy status to other antibiotic agents status: Secondary | ICD-10-CM | POA: Diagnosis not present

## 2022-08-21 DIAGNOSIS — D649 Anemia, unspecified: Secondary | ICD-10-CM | POA: Diagnosis not present

## 2022-08-21 DIAGNOSIS — R519 Headache, unspecified: Secondary | ICD-10-CM | POA: Diagnosis not present

## 2022-08-21 DIAGNOSIS — Z79620 Long term (current) use of immunosuppressive biologic: Secondary | ICD-10-CM | POA: Diagnosis not present

## 2022-08-21 DIAGNOSIS — R5383 Other fatigue: Secondary | ICD-10-CM | POA: Diagnosis not present

## 2022-08-21 DIAGNOSIS — C50412 Malignant neoplasm of upper-outer quadrant of left female breast: Secondary | ICD-10-CM | POA: Diagnosis not present

## 2022-08-21 DIAGNOSIS — L659 Nonscarring hair loss, unspecified: Secondary | ICD-10-CM | POA: Diagnosis not present

## 2022-08-21 DIAGNOSIS — R197 Diarrhea, unspecified: Secondary | ICD-10-CM | POA: Diagnosis not present

## 2022-08-21 DIAGNOSIS — Z79633 Long term (current) use of mitotic inhibitor: Secondary | ICD-10-CM | POA: Diagnosis not present

## 2022-08-21 DIAGNOSIS — Z79899 Other long term (current) drug therapy: Secondary | ICD-10-CM | POA: Diagnosis not present

## 2022-08-21 DIAGNOSIS — Z95828 Presence of other vascular implants and grafts: Secondary | ICD-10-CM

## 2022-08-21 DIAGNOSIS — Z5112 Encounter for antineoplastic immunotherapy: Secondary | ICD-10-CM | POA: Diagnosis not present

## 2022-08-21 LAB — CMP (CANCER CENTER ONLY)
ALT: 34 U/L (ref 0–44)
AST: 23 U/L (ref 15–41)
Albumin: 4.2 g/dL (ref 3.5–5.0)
Alkaline Phosphatase: 102 U/L (ref 38–126)
Anion gap: 6 (ref 5–15)
BUN: 17 mg/dL (ref 8–23)
CO2: 29 mmol/L (ref 22–32)
Calcium: 9.6 mg/dL (ref 8.9–10.3)
Chloride: 102 mmol/L (ref 98–111)
Creatinine: 1.16 mg/dL — ABNORMAL HIGH (ref 0.44–1.00)
GFR, Estimated: 52 mL/min — ABNORMAL LOW (ref >60)
Glucose, Bld: 97 mg/dL (ref 70–99)
Potassium: 3.7 mmol/L (ref 3.5–5.1)
Sodium: 137 mmol/L (ref 135–145)
Total Bilirubin: 0.6 mg/dL (ref 0.3–1.2)
Total Protein: 7.6 g/dL (ref 6.5–8.1)

## 2022-08-21 LAB — CBC WITH DIFFERENTIAL (CANCER CENTER ONLY)
Abs Immature Granulocytes: 0.02 10*3/uL (ref 0.00–0.07)
Basophils Absolute: 0 10*3/uL (ref 0.0–0.1)
Basophils Relative: 1 %
Eosinophils Absolute: 0.3 10*3/uL (ref 0.0–0.5)
Eosinophils Relative: 6 %
HCT: 32.5 % — ABNORMAL LOW (ref 36.0–46.0)
Hemoglobin: 10.7 g/dL — ABNORMAL LOW (ref 12.0–15.0)
Immature Granulocytes: 0 %
Lymphocytes Relative: 37 %
Lymphs Abs: 1.8 10*3/uL (ref 0.7–4.0)
MCH: 27.9 pg (ref 26.0–34.0)
MCHC: 32.9 g/dL (ref 30.0–36.0)
MCV: 84.6 fL (ref 80.0–100.0)
Monocytes Absolute: 0.2 10*3/uL (ref 0.1–1.0)
Monocytes Relative: 4 %
Neutro Abs: 2.5 10*3/uL (ref 1.7–7.7)
Neutrophils Relative %: 52 %
Platelet Count: 301 10*3/uL (ref 150–400)
RBC: 3.84 MIL/uL — ABNORMAL LOW (ref 3.87–5.11)
RDW: 14.4 % (ref 11.5–15.5)
WBC Count: 4.9 10*3/uL (ref 4.0–10.5)
nRBC: 0 % (ref 0.0–0.2)

## 2022-08-21 MED ORDER — ACETAMINOPHEN 325 MG PO TABS
650.0000 mg | ORAL_TABLET | Freq: Once | ORAL | Status: AC
  Administered 2022-08-21: 650 mg via ORAL
  Filled 2022-08-21: qty 2

## 2022-08-21 MED ORDER — SODIUM CHLORIDE 0.9 % IV SOLN
10.0000 mg | Freq: Once | INTRAVENOUS | Status: AC
  Administered 2022-08-21: 10 mg via INTRAVENOUS
  Filled 2022-08-21: qty 10

## 2022-08-21 MED ORDER — DIPHENHYDRAMINE HCL 50 MG/ML IJ SOLN
50.0000 mg | Freq: Once | INTRAMUSCULAR | Status: AC
  Administered 2022-08-21: 50 mg via INTRAVENOUS
  Filled 2022-08-21: qty 1

## 2022-08-21 MED ORDER — TRASTUZUMAB-ANNS CHEMO 150 MG IV SOLR
2.0000 mg/kg | Freq: Once | INTRAVENOUS | Status: AC
  Administered 2022-08-21: 231 mg via INTRAVENOUS
  Filled 2022-08-21: qty 11

## 2022-08-21 MED ORDER — HEPARIN SOD (PORK) LOCK FLUSH 100 UNIT/ML IV SOLN
500.0000 [IU] | Freq: Once | INTRAVENOUS | Status: AC | PRN
  Administered 2022-08-21: 500 [IU]

## 2022-08-21 MED ORDER — SODIUM CHLORIDE 0.9% FLUSH
10.0000 mL | INTRAVENOUS | Status: DC | PRN
  Administered 2022-08-21: 10 mL

## 2022-08-21 MED ORDER — FAMOTIDINE IN NACL 20-0.9 MG/50ML-% IV SOLN
20.0000 mg | Freq: Once | INTRAVENOUS | Status: AC
  Administered 2022-08-21: 20 mg via INTRAVENOUS
  Filled 2022-08-21: qty 50

## 2022-08-21 MED ORDER — SODIUM CHLORIDE 0.9 % IV SOLN
80.0000 mg/m2 | Freq: Once | INTRAVENOUS | Status: AC
  Administered 2022-08-21: 186 mg via INTRAVENOUS
  Filled 2022-08-21: qty 31

## 2022-08-21 MED ORDER — SODIUM CHLORIDE 0.9 % IV SOLN: Freq: Once | INTRAVENOUS | Status: AC

## 2022-08-21 NOTE — Assessment & Plan Note (Signed)
04/30/2022: Screening mammogram detected left breast mass: Diagnostic mammogram and ultrasound revealed 2 o'clock position left breast irregular hypoechoic mass with calcification measuring 0.7 cm. Adjacent to this another irregular hypoechoic mass 0.5 cm. Axilla negative. Biopsy revealed grade 3 IDC with DCIS ER 100%, PR 100%, Ki-67 60%, HER2 3+ positive    07/19/2022: Right lumpectomy: Grade 2 IDC, 1 cm with intermediate grade DCIS, ER 95%, PR 90%,, Ki-67 5%, HER2 -, 0/2 lymph nodes negative, anterior margin positive (it is skin so therefore no additional surgery is needed) Left lumpectomy: Grade 3 IDC 1.1 cm with DCIS, margins negative, 0/2 lymph nodes negative, ER 100%, PR 100%, HER2 positive 3+, Ki-67 60% ------------------------------------------------------------------------------------------------------------------------------------------------ Treatment plan: Adjuvant chemotherapy with Taxol Herceptin followed by Herceptin maintenance Adjuvant radiation Adjuvant antiestrogen therapy ------------------------------------------------------------------------  Current treatment: Cycle 2 Taxol Herceptin   Anemia: Hemoglobin of 10: Probably related to prior surgery.  We will watch and monitor for now.   Patient is enrolled in neuropathy trial Return to clinic weekly for chemo on every other week for follow-up with [place]

## 2022-08-21 NOTE — Patient Instructions (Signed)
[place] [facility]  Discharge Instructions: Thank you for choosing [facility] to provide your oncology and hematology care.   If you have a lab appointment with the Cancer Center, please go directly to the Cancer Center and check in at the registration area.   Wear comfortable clothing and clothing appropriate for easy access to any Portacath or PICC line.   We strive to give you quality time with your provider. You may need to reschedule your appointment if you arrive late (15 or more minutes).  Arriving late affects you and other patients whose appointments are after yours.  Also, if you miss three or more appointments without notifying the office, you may be dismissed from the clinic at the provider's discretion.      For prescription refill requests, have your pharmacy contact our office and allow 72 hours for refills to be completed.    Today you received the following chemotherapy and/or immunotherapy agents: trastuzumab-anns and paclitaxel      To help prevent nausea and vomiting after your treatment, we encourage you to take your nausea medication as directed.  BELOW ARE SYMPTOMS THAT SHOULD BE REPORTED IMMEDIATELY: *FEVER GREATER THAN 100.4 F (38 C) OR HIGHER *CHILLS OR SWEATING *NAUSEA AND VOMITING THAT IS NOT CONTROLLED WITH YOUR NAUSEA MEDICATION *UNUSUAL SHORTNESS OF BREATH *UNUSUAL BRUISING OR BLEEDING *URINARY PROBLEMS (pain or burning when urinating, or frequent urination) *BOWEL PROBLEMS (unusual diarrhea, constipation, pain near the anus) TENDERNESS IN MOUTH AND THROAT WITH OR WITHOUT PRESENCE OF ULCERS (sore throat, sores in mouth, or a toothache) UNUSUAL RASH, SWELLING OR PAIN  UNUSUAL VAGINAL DISCHARGE OR ITCHING   Items with * indicate a potential emergency and should be followed up as soon as possible or go to the Emergency Department if any problems should occur.  Please show the CHEMOTHERAPY ALERT CARD or  IMMUNOTHERAPY ALERT CARD at check-in to the Emergency Department and triage nurse.  Should you have questions after your visit or need to cancel or reschedule your appointment, please contact  [facility]  Dept: [phone]  and follow the prompts.  Office hours are 8:00 a.m. to 4:30 p.m. Monday - Friday. Please note that voicemails left after 4:00 p.m. may not be returned until the following business day.  We are closed weekends and major holidays. You have access to a nurse at all times for urgent questions. Please call the main number to the clinic Dept: [phone] and follow the prompts.   For any non-urgent questions, you may also contact your provider using MyChart. We now offer e-Visits for anyone 42 and older to request care online for non-urgent symptoms. For details visit [email or web address].   Also download the MyChart app! Go to the app store, search "MyChart", open the app, select , and log in with your MyChart username and password.

## 2022-08-21 NOTE — Research (Unsigned)
D7412, ICE COMPRESS: RANDOMIZED TRIAL OF LIMB CRYOCOMPRESSION VERSUS CONTINUOUS COMPRESSION VERSUS LOW CYCLIC COMPRESSION FOR THE PREVENTION OF TAXANE-INDUCED PERIPHERAL NEUROPATHY    CYCLE 2  STUDY INTERVENTION: Study intervention initiated at 12:53 PM.           TOLERABILITY ASSESSMENTS:             Tolerability assessments performed at:             5-10 min post intervention start: 12:59.  Tolerable.  Reported off and transferred care to Lambert Keto, RN at 1pm.   Christy Joiner 'Warden Fillers' Dairl Ponder, RN, BSN, [facility] Clinical Research Nurse I 08/21/22 12:59 PM

## 2022-08-21 NOTE — Research (Unsigned)
TRIAL S2205, ICE COMPRESS: RANDOMIZED TRIAL OF LIMB CRYOCOMPRESSION VERSUS CONTINUOUS COMPRESSION VERSUS LOW CYCLIC COMPRESSION FOR THE PREVENTION  OF TAXANE-INDUCED PERIPHERAL NEUROPATHY   Patient arrives today Accompanied by her brother  for the Week 2 visit. Assumed care from Research RN Melany Guernsey at 1300.  1310: No complaints/tolerable. 1324: Taxane started; treatment phase started (on low cyclic compression) 1330: Tolerable/no complaints. 1340: Tolerable/no complaints. 1431: Taxane complete; pt moved to post-treatment phase. 1503: Treatment complete. No complaints/concerns. Confirmed no lesions/sores/redness under wrap areas.    ADVERSE EVENTS: [name] reports AE as below. Attribution per Dr Pamelia Hoit.  ADVERSE EVENT LOG:  VEATRICE MCCUMBER [id number]  08/21/2022  Adverse Event Log  Study/Protocol: S2205, ICE COMPRESS: RANDOMIZED TRIAL OF LIMB CRYOCOMPRESSION VERSUS CONTINUOUS COMPRESSION VERSUS LOW CYCLIC COMPRESSION FOR THE PREVENTION  OF TAXANE-INDUCED PERIPHERAL NEUROPATHY Cycle: Week 2  Event Grade Onset Date Resolved Date Drug Name Attribution Treatment Comments  Insomnia Grade 1 08/14/22   {ONC BCN CTCAE ATTRIBUTION:21196}                                                    GIFT CARD: This study does not provide visit compensation.   DISPOSITION: Upon completion off all study requirements, patient was escorted to restroom; treatment complete; brother will meet pt at the door with the car.   The patient was thanked for their time and continued voluntary participation in this study. [name] has been provided direct contact information and is encouraged to contact this Nurse for any needs or questions.  Margret Chance Bertin Inabinet, RN, BSN, [facility] She  Her  Hers Clinical Research Nurse [facility] Direct Dial [phone]  Pager [phone] 08/21/2022 3:54 PM

## 2022-08-22 ENCOUNTER — Encounter: Payer: Self-pay | Admitting: Hematology and Oncology

## 2022-08-22 ENCOUNTER — Other Ambulatory Visit: Payer: Self-pay

## 2022-08-24 MED FILL — Dexamethasone Sodium Phosphate Inj 100 MG/10ML: INTRAMUSCULAR | Qty: 1 | Status: AC

## 2022-08-27 ENCOUNTER — Inpatient Hospital Stay: Payer: Medicare Other

## 2022-08-27 VITALS — BP 110/60 | HR 63 | Temp 98.7°F | Resp 18 | Wt 246.5 lb

## 2022-08-27 DIAGNOSIS — C50412 Malignant neoplasm of upper-outer quadrant of left female breast: Secondary | ICD-10-CM

## 2022-08-27 DIAGNOSIS — Z95828 Presence of other vascular implants and grafts: Secondary | ICD-10-CM

## 2022-08-27 DIAGNOSIS — L659 Nonscarring hair loss, unspecified: Secondary | ICD-10-CM | POA: Diagnosis not present

## 2022-08-27 DIAGNOSIS — Z79633 Long term (current) use of mitotic inhibitor: Secondary | ICD-10-CM | POA: Diagnosis not present

## 2022-08-27 DIAGNOSIS — Z170 Estrogen receptor positive status [ER+]: Secondary | ICD-10-CM | POA: Diagnosis not present

## 2022-08-27 DIAGNOSIS — R5383 Other fatigue: Secondary | ICD-10-CM | POA: Diagnosis not present

## 2022-08-27 DIAGNOSIS — Z5112 Encounter for antineoplastic immunotherapy: Secondary | ICD-10-CM | POA: Diagnosis not present

## 2022-08-27 DIAGNOSIS — G629 Polyneuropathy, unspecified: Secondary | ICD-10-CM | POA: Diagnosis not present

## 2022-08-27 DIAGNOSIS — R519 Headache, unspecified: Secondary | ICD-10-CM | POA: Diagnosis not present

## 2022-08-27 DIAGNOSIS — Z5111 Encounter for antineoplastic chemotherapy: Secondary | ICD-10-CM | POA: Diagnosis not present

## 2022-08-27 DIAGNOSIS — Z79620 Long term (current) use of immunosuppressive biologic: Secondary | ICD-10-CM | POA: Diagnosis not present

## 2022-08-27 DIAGNOSIS — R197 Diarrhea, unspecified: Secondary | ICD-10-CM | POA: Diagnosis not present

## 2022-08-27 DIAGNOSIS — Z881 Allergy status to other antibiotic agents status: Secondary | ICD-10-CM | POA: Diagnosis not present

## 2022-08-27 DIAGNOSIS — Z79899 Other long term (current) drug therapy: Secondary | ICD-10-CM | POA: Diagnosis not present

## 2022-08-27 DIAGNOSIS — D649 Anemia, unspecified: Secondary | ICD-10-CM | POA: Diagnosis not present

## 2022-08-27 LAB — CMP (CANCER CENTER ONLY)
ALT: 38 U/L (ref 0–44)
AST: 26 U/L (ref 15–41)
Albumin: 4.3 g/dL (ref 3.5–5.0)
Alkaline Phosphatase: 98 U/L (ref 38–126)
Anion gap: 7 (ref 5–15)
BUN: 16 mg/dL (ref 8–23)
CO2: 26 mmol/L (ref 22–32)
Calcium: 9.6 mg/dL (ref 8.9–10.3)
Chloride: 102 mmol/L (ref 98–111)
Creatinine: 1.11 mg/dL — ABNORMAL HIGH (ref 0.44–1.00)
GFR, Estimated: 54 mL/min — ABNORMAL LOW (ref >60)
Glucose, Bld: 106 mg/dL — ABNORMAL HIGH (ref 70–99)
Potassium: 4 mmol/L (ref 3.5–5.1)
Sodium: 135 mmol/L (ref 135–145)
Total Bilirubin: 0.8 mg/dL (ref 0.3–1.2)
Total Protein: 7.7 g/dL (ref 6.5–8.1)

## 2022-08-27 LAB — CBC WITH DIFFERENTIAL (CANCER CENTER ONLY)
Abs Immature Granulocytes: 0.02 10*3/uL (ref 0.00–0.07)
Basophils Absolute: 0 10*3/uL (ref 0.0–0.1)
Basophils Relative: 1 %
Eosinophils Absolute: 0.1 10*3/uL (ref 0.0–0.5)
Eosinophils Relative: 3 %
HCT: 32.3 % — ABNORMAL LOW (ref 36.0–46.0)
Hemoglobin: 10.9 g/dL — ABNORMAL LOW (ref 12.0–15.0)
Immature Granulocytes: 1 %
Lymphocytes Relative: 38 %
Lymphs Abs: 1.2 10*3/uL (ref 0.7–4.0)
MCH: 28.3 pg (ref 26.0–34.0)
MCHC: 33.7 g/dL (ref 30.0–36.0)
MCV: 83.9 fL (ref 80.0–100.0)
Monocytes Absolute: 0.2 10*3/uL (ref 0.1–1.0)
Monocytes Relative: 5 %
Neutro Abs: 1.7 10*3/uL (ref 1.7–7.7)
Neutrophils Relative %: 52 %
Platelet Count: 381 10*3/uL (ref 150–400)
RBC: 3.85 MIL/uL — ABNORMAL LOW (ref 3.87–5.11)
RDW: 14.4 % (ref 11.5–15.5)
WBC Count: 3.2 10*3/uL — ABNORMAL LOW (ref 4.0–10.5)
nRBC: 0 % (ref 0.0–0.2)

## 2022-08-27 MED ORDER — SODIUM CHLORIDE 0.9 % IV SOLN: Freq: Once | INTRAVENOUS | Status: AC

## 2022-08-27 MED ORDER — DIPHENHYDRAMINE HCL 50 MG/ML IJ SOLN
50.0000 mg | Freq: Once | INTRAMUSCULAR | Status: AC
  Administered 2022-08-27: 50 mg via INTRAVENOUS
  Filled 2022-08-27: qty 1

## 2022-08-27 MED ORDER — TRASTUZUMAB-ANNS CHEMO 150 MG IV SOLR
2.0000 mg/kg | Freq: Once | INTRAVENOUS | Status: AC
  Administered 2022-08-27: 231 mg via INTRAVENOUS
  Filled 2022-08-27: qty 11

## 2022-08-27 MED ORDER — FAMOTIDINE IN NACL 20-0.9 MG/50ML-% IV SOLN
20.0000 mg | Freq: Once | INTRAVENOUS | Status: AC
  Administered 2022-08-27: 20 mg via INTRAVENOUS
  Filled 2022-08-27: qty 50

## 2022-08-27 MED ORDER — ACETAMINOPHEN 325 MG PO TABS
650.0000 mg | ORAL_TABLET | Freq: Once | ORAL | Status: AC
  Administered 2022-08-27: 650 mg via ORAL
  Filled 2022-08-27: qty 2

## 2022-08-27 MED ORDER — SODIUM CHLORIDE 0.9% FLUSH
10.0000 mL | INTRAVENOUS | Status: DC | PRN
  Administered 2022-08-27: 10 mL

## 2022-08-27 MED ORDER — SODIUM CHLORIDE 0.9 % IV SOLN
80.0000 mg/m2 | Freq: Once | INTRAVENOUS | Status: AC
  Administered 2022-08-27: 186 mg via INTRAVENOUS
  Filled 2022-08-27: qty 31

## 2022-08-27 MED ORDER — SODIUM CHLORIDE 0.9 % IV SOLN
10.0000 mg | Freq: Once | INTRAVENOUS | Status: AC
  Administered 2022-08-27: 10 mg via INTRAVENOUS
  Filled 2022-08-27: qty 10

## 2022-08-27 MED ORDER — HEPARIN SOD (PORK) LOCK FLUSH 100 UNIT/ML IV SOLN
500.0000 [IU] | Freq: Once | INTRAVENOUS | Status: AC | PRN
  Administered 2022-08-27: 500 [IU]

## 2022-08-27 NOTE — Research (Signed)
Trial: DCP-001: Use of a Clinical Trial Screening Tool to Address Cancer Health Disparities in the [employer] Community Oncology Research Program [facility])  [name] was identified by this RN as a potential candidate for the above listed study.  This Clinical Research Nurse met with Christy Lynch, [name], on 08/27/22 in a manner and location that ensures patient privacy to discuss participation in the above listed research study.  Patient is Accompanied by her husband .  A copy of the informed consent document with embedded HIPAA language was provided to the patient.  Patient reads, speaks, and understands [place].    Patient was provided with the business card of this Nurse and encouraged to contact the research team with any questions.  Patient was provided the option of taking informed consent documents home to review and was encouraged to review at their convenience with their support network, including other care providers. Patient is comfortable with making a decision regarding study participation today.  As outlined in the informed consent form, this Nurse and Georgann Housekeeper discussed the purpose of the research study, the investigational nature of the study, study procedures and requirements for study participation, potential risks and benefits of study participation, as well as alternatives to participation. This study is not blinded. The patient understands participation is voluntary and they may withdraw from study participation at any time.  This study does not involve randomization.  This study does not involve an investigational drug or device. This study does not involve a placebo. Patient understands enrollment is pending full eligibility review.   Confidentiality and how the patient's information will be used as part of study participation were discussed.  Patient was informed there is not reimbursement provided for their time and effort spent on trial  participation.  The patient is encouraged to discuss research study participation with their insurance provider to determine what costs they may incur as part of study participation, including research related injury.    All questions were answered to patient's satisfaction.  The informed consent and separate HIPAA Authorization was reviewed page by page.  The patient's mental and emotional status is appropriate to provide informed consent, and the patient verbalizes an understanding of study participation.  Patient has agreed to participate in the above listed research study and has voluntarily signed the informed consent version date 06/12/21 and separate HIPAA Authorization, version 15  on 08/27/22 at 11:26AM.  The patient was provided with a copy of the signed informed consent form and separate HIPAA Authorization for their reference.  No study specific procedures were obtained prior to the signing of the informed consent document.  Approximately 10 minutes were spent with the patient reviewing the informed consent documents.  Patient was not requested to complete a Release of Information form.   This Nurse has reviewed this patient's inclusion and exclusion criteria and confirmed Christy Lynch is eligible for study participation.  Patient will continue with enrollment.  Eligibility confirmed by treating investigator, who also agrees that patient should proceed with enrollment.  Christy Chance Camrynn Mcclintic, RN, BSN, [facility] She  Her  Hers Clinical Research Nurse [facility] Direct Dial [phone]  Pager [phone] 08/27/2022 11:49 AM

## 2022-08-27 NOTE — Research (Signed)
J1884, ICE COMPRESS: RANDOMIZED TRIAL OF LIMB CRYOCOMPRESSION VERSUS CONTINUOUS COMPRESSION VERSUS LOW CYCLIC COMPRESSION FOR THE PREVENTION  OF TAXANE-INDUCED PERIPHERAL NEUROPATHY   Patient arrives today Accompanied by her husband  for the Week 3 visit. Confirmed no wounds, lesions, or sores to extremities.  1247 Wraps applied; pre-treatment phase started. 1255 No complaints; tolerable. 1305 No complaints; tolerable. 1331 Taxane & treatment phase started. 1340 No complaints; tolerable. 1359 Transient nausea relieved by mints & pretzels. Research device tolerable. 1433 Taxane complete; moved to post-treatment phase. 1503 Completed; wraps removed. No wounds or sores.     ADVERSE EVENTS: [name] reports AE as below.  ADVERSE EVENT LOG:  CHINASA SHIVERS [id number]  08/27/2022  Adverse Event Log  Study/Protocol: S2205, ICE COMPRESS: RANDOMIZED TRIAL OF LIMB CRYOCOMPRESSION VERSUS CONTINUOUS COMPRESSION VERSUS LOW CYCLIC COMPRESSION FOR THE PREVENTION  OF TAXANE-INDUCED PERIPHERAL NEUROPATHY Cycle: Week 3  Event Grade Onset Date Resolved Date Drug Name Attribution Treatment Comments  Insomnia Grade 1 08/14/22   Unrelated  Happens only on night of chemo  Diarrhea Grade 1 08/21/22  Paclitaxel &/or trastuzumab Definite  1-2 loose stools/day                                        NEURO ASSESSMENT: Not due this visit.   DISPOSITION: Upon completion off all study requirements, patient was escorted to the restroom & has completed today's treatment.   The patient was thanked for their time and continued voluntary participation in this study. [name] has been provided direct contact information and is encouraged to contact this Nurse for any needs or questions.  Margret Chance Criag Wicklund, RN, BSN, [facility] She  Her  Hers Clinical Research Nurse [facility] Direct Dial [phone]  Pager [phone] 08/27/2022 4:42 PM

## 2022-08-27 NOTE — Patient Instructions (Signed)
[place] [facility]  Discharge Instructions: Thank you for choosing [facility] to provide your oncology and hematology care.   If you have a lab appointment with the Cancer Center, please go directly to the Cancer Center and check in at the registration area.   Wear comfortable clothing and clothing appropriate for easy access to any Portacath or PICC line.   We strive to give you quality time with your provider. You may need to reschedule your appointment if you arrive late (15 or more minutes).  Arriving late affects you and other patients whose appointments are after yours.  Also, if you miss three or more appointments without notifying the office, you may be dismissed from the clinic at the provider's discretion.      For prescription refill requests, have your pharmacy contact our office and allow 72 hours for refills to be completed.    Today you received the following chemotherapy and/or immunotherapy agents: trastuzumab-anns and paclitaxel      To help prevent nausea and vomiting after your treatment, we encourage you to take your nausea medication as directed.  BELOW ARE SYMPTOMS THAT SHOULD BE REPORTED IMMEDIATELY: *FEVER GREATER THAN 100.4 F (38 C) OR HIGHER *CHILLS OR SWEATING *NAUSEA AND VOMITING THAT IS NOT CONTROLLED WITH YOUR NAUSEA MEDICATION *UNUSUAL SHORTNESS OF BREATH *UNUSUAL BRUISING OR BLEEDING *URINARY PROBLEMS (pain or burning when urinating, or frequent urination) *BOWEL PROBLEMS (unusual diarrhea, constipation, pain near the anus) TENDERNESS IN MOUTH AND THROAT WITH OR WITHOUT PRESENCE OF ULCERS (sore throat, sores in mouth, or a toothache) UNUSUAL RASH, SWELLING OR PAIN  UNUSUAL VAGINAL DISCHARGE OR ITCHING   Items with * indicate a potential emergency and should be followed up as soon as possible or go to the Emergency Department if any problems should occur.  Please show the CHEMOTHERAPY ALERT CARD or  IMMUNOTHERAPY ALERT CARD at check-in to the Emergency Department and triage nurse.  Should you have questions after your visit or need to cancel or reschedule your appointment, please contact [place] [facility]  Dept: [phone]  and follow the prompts.  Office hours are 8:00 a.m. to 4:30 p.m. Monday - Friday. Please note that voicemails left after 4:00 p.m. may not be returned until the following business day.  We are closed weekends and major holidays. You have access to a nurse at all times for urgent questions. Please call the main number to the clinic Dept: [phone] and follow the prompts.   For any non-urgent questions, you may also contact your provider using MyChart. We now offer e-Visits for anyone 18 and older to request care online for non-urgent symptoms. For details visit [email or web address].   Also download the MyChart app! Go to the app store, search "MyChart", open the app, select , and log in with your MyChart username and password.   

## 2022-08-31 MED FILL — Dexamethasone Sodium Phosphate Inj 100 MG/10ML: INTRAMUSCULAR | Qty: 1 | Status: AC

## 2022-08-31 NOTE — Progress Notes (Signed)
Patient Care Team: [name], MD as PCP - General (Internal Medicine) [name], MD as Consulting Physician (Hematology and Oncology) Pershing Proud, RN as Oncology Nurse Navigator Donnelly Angelica, RN as Oncology Nurse Navigator  DIAGNOSIS: No diagnosis found.  SUMMARY OF ONCOLOGIC HISTORY: Oncology History  Malignant neoplasm of upper-outer quadrant of left breast in female, estrogen receptor positive  04/30/2022 Initial Diagnosis   Screening mammogram to treat left breast mass measuring 0.7 cm, adjacent mass 0.5 cm, biopsy of both revealed grade 3 IDC with DCIS ER 100% PR 100% Ki-67 60%, HER2 3+ positive   05/25/2022 Cancer Staging   Staging form: Breast, AJCC 8th Edition - Clinical: Stage IA (cT1b, cN0, cM0, G3, ER+, PR+, HER2+) - Signed by [name], MD on 05/25/2022 Stage prefix: Initial diagnosis Histologic grading system: 3 grade system   07/19/2022 Surgery   Right lumpectomy: Grade 2 IDC, 1 cm with intermediate grade DCIS, ER 95%, PR 90%,, Ki-67 5%, HER2 -, 0/2 lymph nodes negative, anterior margin positive Left lumpectomy: Grade 3 IDC 1.1 cm with DCIS, margins negative, 0/2 lymph nodes negative, ER 100%, PR 100%, HER2 positive 3+, Ki-67 60%   07/29/2022 Genetic Testing   Negative genetic testing on the CancerNext-Expanded+RNAinsight panel.  The report date is July 29, 2022.  The CancerNext-Expanded gene panel offered by [facility] and includes sequencing and rearrangement analysis for the following 77 genes: AIP, ALK, APC*, ATM*, AXIN2, BAP1, BARD1, BMPR1A, BRCA1*, BRCA2*, BRIP1*, CDC73, CDH1*, CDK4, CDKN1B, CDKN2A, CHEK2*, CTNNA1, DICER1, FH, FLCN, KIF1B, LZTR1, MAX, MEN1, MET, MLH1*, MSH2*, MSH3, MSH6*, MUTYH*, NF1*, NF2, NTHL1, PALB2*, PHOX2B, PMS2*, POT1, PRKAR1A, PTCH1, PTEN*, RAD51C*, RAD51D*, RB1, RET, SDHA, SDHAF2, SDHB, SDHC, SDHD, SMAD4, SMARCA4, SMARCB1, SMARCE1, STK11, SUFU, TMEM127, TP53*, TSC1, TSC2, and VHL (sequencing and deletion/duplication);  EGFR, EGLN1, HOXB13, KIT, MITF, PDGFRA, POLD1, and POLE (sequencing only); EPCAM and GREM1 (deletion/duplication only). DNA and RNA analyses performed for * genes.    08/14/2022 -  Chemotherapy   Patient is on Treatment Plan : BREAST Paclitaxel + Trastuzumab q7d / Trastuzumab q21d         CHIEF COMPLIANT: Follow-up Cycle 4 Taxol Herceptin    INTERVAL HISTORY: Christy Lynch is a 67 y.o. female is here because of recent diagnosis of left breast cancer. She presents to the clinic for a follow-up.    ALLERGIES:  is allergic to flagyl [metronidazole].  MEDICATIONS:  Current Outpatient Medications  Medication Sig Dispense Refill   aspirin EC 81 MG tablet Take 81 mg by mouth daily. Swallow whole.     atenolol (TENORMIN) 50 MG tablet Take 50 mg by mouth every evening.     chlorhexidine (PERIDEX) 0.12 % solution 15 mLs 2 (two) times daily.     DULoxetine (CYMBALTA) 60 MG capsule Take 60 mg by mouth every evening.     esomeprazole (NEXIUM) 40 MG capsule Take 40 mg by mouth daily at 12 noon.     lidocaine-prilocaine (EMLA) cream Apply to affected area once 30 g 3   lisinopril-hydrochlorothiazide (ZESTORETIC) 20-25 MG tablet Take 1 tablet by mouth daily.     Multiple Vitamin (MULTIVITAMIN WITH MINERALS) TABS tablet Take 1 tablet by mouth every evening.     prochlorperazine (COMPAZINE) 10 MG tablet Take 1 tablet (10 mg total) by mouth every 6 (six) hours as needed for nausea or vomiting. 30 tablet 1   No current facility-administered medications for this visit.    PHYSICAL EXAMINATION: ECOG PERFORMANCE STATUS: {CHL ONC ECOG ZO:[id number]}  There were no vitals filed for this visit. There were no vitals filed for this visit.  BREAST:*** No palpable masses or nodules in either right or left breasts. No palpable axillary supraclavicular or infraclavicular adenopathy no breast tenderness or nipple discharge. (exam performed in the presence of a chaperone)  LABORATORY DATA:  I have  reviewed the data as listed    Latest Ref Rng & Units 08/27/2022   10:44 AM 08/21/2022   10:37 AM 08/14/2022   10:41 AM  CMP  Glucose 70 - 99 mg/dL 403  97  95   BUN 8 - 23 mg/dL Creatinine 0.44 - 1.00 mg/dL 4.74  2.59  5.63   Sodium 135 - 145 mmol/L 135  137  140   Potassium 3.5 - 5.1 mmol/L 4.0  3.7  3.7   Chloride 98 - 111 mmol/L 102  102  107   CO2 22 - 32 mmol/L Calcium 8.9 - 10.3 mg/dL 9.6  9.6  9.5   Total Protein 6.5 - 8.1 g/dL 7.7  7.6  7.0   Total Bilirubin 0.3 - 1.2 mg/dL 0.8  0.6  0.6   Alkaline Phos 38 - 126 U/L 98  102  91   AST 15 - 41 U/L ALT 0 - 44 U/L 38  34  20     Lab Results  Component Value Date   WBC 3.2 (L) 08/27/2022   HGB 10.9 (L) 08/27/2022   HCT 32.3 (L) 08/27/2022   MCV 83.9 08/27/2022   PLT 381 08/27/2022   NEUTROABS 1.7 08/27/2022    ASSESSMENT & PLAN:  No problem-specific Assessment & Plan notes found for this encounter.    No orders of the defined types were placed in this encounter.  The patient has a good understanding of the overall plan. she agrees with it. she will call with any problems that may develop before the next visit here. Total time spent: 30 mins including face to face time and time spent for planning, charting and co-ordination of care   Sherlyn Lick, CMA 08/31/22    I Janan Ridge am acting as a Neurosurgeon for [employer]  ***

## 2022-09-03 ENCOUNTER — Inpatient Hospital Stay: Payer: Medicare Other | Admitting: Hematology and Oncology

## 2022-09-03 ENCOUNTER — Inpatient Hospital Stay: Payer: Medicare Other

## 2022-09-03 ENCOUNTER — Other Ambulatory Visit: Payer: Self-pay | Admitting: *Deleted

## 2022-09-03 VITALS — BP 120/67 | HR 76 | Temp 97.2°F | Resp 18 | Ht 67.0 in | Wt 249.3 lb

## 2022-09-03 DIAGNOSIS — C50412 Malignant neoplasm of upper-outer quadrant of left female breast: Secondary | ICD-10-CM

## 2022-09-03 DIAGNOSIS — L659 Nonscarring hair loss, unspecified: Secondary | ICD-10-CM | POA: Diagnosis not present

## 2022-09-03 DIAGNOSIS — Z5112 Encounter for antineoplastic immunotherapy: Secondary | ICD-10-CM | POA: Diagnosis not present

## 2022-09-03 DIAGNOSIS — Z79633 Long term (current) use of mitotic inhibitor: Secondary | ICD-10-CM | POA: Diagnosis not present

## 2022-09-03 DIAGNOSIS — Z95828 Presence of other vascular implants and grafts: Secondary | ICD-10-CM

## 2022-09-03 DIAGNOSIS — R5383 Other fatigue: Secondary | ICD-10-CM | POA: Diagnosis not present

## 2022-09-03 DIAGNOSIS — Z170 Estrogen receptor positive status [ER+]: Secondary | ICD-10-CM

## 2022-09-03 DIAGNOSIS — D649 Anemia, unspecified: Secondary | ICD-10-CM | POA: Diagnosis not present

## 2022-09-03 DIAGNOSIS — Z79899 Other long term (current) drug therapy: Secondary | ICD-10-CM | POA: Diagnosis not present

## 2022-09-03 DIAGNOSIS — Z881 Allergy status to other antibiotic agents status: Secondary | ICD-10-CM | POA: Diagnosis not present

## 2022-09-03 DIAGNOSIS — G629 Polyneuropathy, unspecified: Secondary | ICD-10-CM | POA: Diagnosis not present

## 2022-09-03 DIAGNOSIS — R197 Diarrhea, unspecified: Secondary | ICD-10-CM | POA: Diagnosis not present

## 2022-09-03 DIAGNOSIS — Z5111 Encounter for antineoplastic chemotherapy: Secondary | ICD-10-CM | POA: Diagnosis not present

## 2022-09-03 DIAGNOSIS — R519 Headache, unspecified: Secondary | ICD-10-CM | POA: Diagnosis not present

## 2022-09-03 DIAGNOSIS — Z79620 Long term (current) use of immunosuppressive biologic: Secondary | ICD-10-CM | POA: Diagnosis not present

## 2022-09-03 LAB — CBC WITH DIFFERENTIAL (CANCER CENTER ONLY)
Abs Immature Granulocytes: 0.04 10*3/uL (ref 0.00–0.07)
Basophils Absolute: 0 10*3/uL (ref 0.0–0.1)
Basophils Relative: 1 %
Eosinophils Absolute: 0 10*3/uL (ref 0.0–0.5)
Eosinophils Relative: 1 %
HCT: 29.4 % — ABNORMAL LOW (ref 36.0–46.0)
Hemoglobin: 9.8 g/dL — ABNORMAL LOW (ref 12.0–15.0)
Immature Granulocytes: 1 %
Lymphocytes Relative: 42 %
Lymphs Abs: 1.7 10*3/uL (ref 0.7–4.0)
MCH: 28.4 pg (ref 26.0–34.0)
MCHC: 33.3 g/dL (ref 30.0–36.0)
MCV: 85.2 fL (ref 80.0–100.0)
Monocytes Absolute: 0.3 10*3/uL (ref 0.1–1.0)
Monocytes Relative: 7 %
Neutro Abs: 2 10*3/uL (ref 1.7–7.7)
Neutrophils Relative %: 48 %
Platelet Count: 503 10*3/uL — ABNORMAL HIGH (ref 150–400)
RBC: 3.45 MIL/uL — ABNORMAL LOW (ref 3.87–5.11)
RDW: 14.5 % (ref 11.5–15.5)
WBC Count: 4.1 10*3/uL (ref 4.0–10.5)
nRBC: 0 % (ref 0.0–0.2)

## 2022-09-03 LAB — CMP (CANCER CENTER ONLY)
ALT: 43 U/L (ref 0–44)
AST: 32 U/L (ref 15–41)
Albumin: 4 g/dL (ref 3.5–5.0)
Alkaline Phosphatase: 89 U/L (ref 38–126)
Anion gap: 6 (ref 5–15)
BUN: 19 mg/dL (ref 8–23)
CO2: 26 mmol/L (ref 22–32)
Calcium: 9.1 mg/dL (ref 8.9–10.3)
Chloride: 102 mmol/L (ref 98–111)
Creatinine: 1.11 mg/dL — ABNORMAL HIGH (ref 0.44–1.00)
GFR, Estimated: 54 mL/min — ABNORMAL LOW (ref >60)
Glucose, Bld: 109 mg/dL — ABNORMAL HIGH (ref 70–99)
Potassium: 3.9 mmol/L (ref 3.5–5.1)
Sodium: 134 mmol/L — ABNORMAL LOW (ref 135–145)
Total Bilirubin: 0.4 mg/dL (ref 0.3–1.2)
Total Protein: 7 g/dL (ref 6.5–8.1)

## 2022-09-03 MED ORDER — DIPHENHYDRAMINE HCL 50 MG/ML IJ SOLN
50.0000 mg | Freq: Once | INTRAMUSCULAR | Status: AC
  Administered 2022-09-03: 50 mg via INTRAVENOUS
  Filled 2022-09-03: qty 1

## 2022-09-03 MED ORDER — SODIUM CHLORIDE 0.9% FLUSH
10.0000 mL | INTRAVENOUS | Status: DC | PRN
  Administered 2022-09-03: 10 mL

## 2022-09-03 MED ORDER — FAMOTIDINE IN NACL 20-0.9 MG/50ML-% IV SOLN
20.0000 mg | Freq: Once | INTRAVENOUS | Status: AC
  Administered 2022-09-03: 20 mg via INTRAVENOUS
  Filled 2022-09-03: qty 50

## 2022-09-03 MED ORDER — SODIUM CHLORIDE 0.9 % IV SOLN: Freq: Once | INTRAVENOUS | Status: AC

## 2022-09-03 MED ORDER — HEPARIN SOD (PORK) LOCK FLUSH 100 UNIT/ML IV SOLN
500.0000 [IU] | Freq: Once | INTRAVENOUS | Status: AC | PRN
  Administered 2022-09-03: 500 [IU]

## 2022-09-03 MED ORDER — TRASTUZUMAB-ANNS CHEMO 150 MG IV SOLR
2.0000 mg/kg | Freq: Once | INTRAVENOUS | Status: AC
  Administered 2022-09-03: 231 mg via INTRAVENOUS
  Filled 2022-09-03: qty 11

## 2022-09-03 MED ORDER — ACETAMINOPHEN 325 MG PO TABS
650.0000 mg | ORAL_TABLET | Freq: Once | ORAL | Status: AC
  Administered 2022-09-03: 650 mg via ORAL
  Filled 2022-09-03: qty 2

## 2022-09-03 MED ORDER — SODIUM CHLORIDE 0.9 % IV SOLN
80.0000 mg/m2 | Freq: Once | INTRAVENOUS | Status: AC
  Administered 2022-09-03: 186 mg via INTRAVENOUS
  Filled 2022-09-03: qty 31

## 2022-09-03 MED ORDER — SODIUM CHLORIDE 0.9 % IV SOLN
10.0000 mg | Freq: Once | INTRAVENOUS | Status: AC
  Administered 2022-09-03: 10 mg via INTRAVENOUS
  Filled 2022-09-03: qty 10

## 2022-09-03 NOTE — Research (Unsigned)
TRIAL S2205, ICE COMPRESS: RANDOMIZED TRIAL OF LIMB CRYOCOMPRESSION VERSUS CONTINUOUS COMPRESSION VERSUS LOW CYCLIC COMPRESSION FOR THE PREVENTION  OF TAXANE-INDUCED PERIPHERAL NEUROPATHY   Patient arrives today Accompanied by her brother  for the Week 4 visit. Confirmed no lesions, sores, wounds, redness to extremities. I sent a message to Roz in the [facility] for update on patient's insurance forms; did not hear back before patient left.  1123 Wraps applied; pre-cooling started. 1130 Tolerable. 1158 Taxane started; pt moved to treatment setting. 1208 Tolerable 1301 Taxane complete; pt moved to post-cooling. 1331 Treatment complete; wraps removed; no complaints, wounds, lesions, redness.   PROs: Per study protocol, no PROs are due this visit.     LABS: No study labs required at this visit.   MEDICATION REVIEW: Patient reviews and verifies the current medication list is correct.  MD/PROVIDER VISIT: Patient sees Dr Pamelia Hoit for today's visit.   ADVERSE EVENTS: [name] reports AE as below. No new complaints or problems. Dr Pamelia Hoit provided AE attribution at previous visit.  ADVERSE EVENT LOGHUE STEVESON [id number]  09/03/2022  Adverse Event Log  Study/Protocol: S2205 Cycle: Week 4   Event Grade Onset Date Resolved Date Drug Name Attribution Treatment Comments  Insomnia Grade 1 08/14/22     Unrelated   Happens only on night of chemo  Diarrhea Grade 1 08/21/22   Paclitaxel &/or trastuzumab Definite   1-2 loose stools/day    EKG ASSESSMENT: EKG assessment is not required for this visit.   GIFT CARD: This study does not provide visit compensation.   DISPOSITION: Upon completion off all study requirements, patient was escorted to the restroom.   The patient was thanked for their time and continued voluntary participation in this study. [name] has been provided direct contact information and is encouraged to contact this Nurse for any  needs or questions.  Margret Chance Zoii Florer, RN, BSN, [facility] She  Her  Hers Clinical Research Nurse [facility] Direct Dial [phone]  Pager [phone] 09/04/2022 12:23 PM

## 2022-09-03 NOTE — Patient Instructions (Signed)
[facility]  Discharge Instructions: Thank you for choosing [facility] to provide your oncology and hematology care.   If you have a lab appointment with the Cancer Center, please go directly to the Cancer Center and check in at the registration area.   Wear comfortable clothing and clothing appropriate for easy access to any Portacath or PICC line.   We strive to give you quality time with your provider. You may need to reschedule your appointment if you arrive late (15 or more minutes).  Arriving late affects you and other patients whose appointments are after yours.  Also, if you miss three or more appointments without notifying the office, you may be dismissed from the clinic at the provider's discretion.      For prescription refill requests, have your pharmacy contact our office and allow 72 hours for refills to be completed.    Today you received the following chemotherapy and/or immunotherapy agents: trastuzumab-anns and paclitaxel      To help prevent nausea and vomiting after your treatment, we encourage you to take your nausea medication as directed.  BELOW ARE SYMPTOMS THAT SHOULD BE REPORTED IMMEDIATELY: *FEVER GREATER THAN 100.4 F (38 C) OR HIGHER *CHILLS OR SWEATING *NAUSEA AND VOMITING THAT IS NOT CONTROLLED WITH YOUR NAUSEA MEDICATION *UNUSUAL SHORTNESS OF BREATH *UNUSUAL BRUISING OR BLEEDING *URINARY PROBLEMS (pain or burning when urinating, or frequent urination) *BOWEL PROBLEMS (unusual diarrhea, constipation, pain near the anus) TENDERNESS IN MOUTH AND THROAT WITH OR WITHOUT PRESENCE OF ULCERS (sore throat, sores in mouth, or a toothache) UNUSUAL RASH, SWELLING OR PAIN  UNUSUAL VAGINAL DISCHARGE OR ITCHING   Items with * indicate a potential emergency and should be followed up as soon as possible or go to the Emergency Department if any problems should occur.  Please show the CHEMOTHERAPY ALERT CARD or  IMMUNOTHERAPY ALERT CARD at check-in to the Emergency Department and triage nurse.  Should you have questions after your visit or need to cancel or reschedule your appointment, please contact [facility]  Dept: [phone]  and follow the prompts.  Office hours are 8:00 a.m. to 4:30 p.m. Monday - Friday. Please note that voicemails left after 4:00 p.m. may not be returned until the following business day.  We are closed weekends and major holidays. You have access to a nurse at all times for urgent questions. Please call the main number to the clinic Dept: [phone] and follow the prompts.   For any non-urgent questions, you may also contact your provider using MyChart. We now offer e-Visits for anyone 18 and older to request care online for non-urgent symptoms. For details visit [email or web address].   Also download the MyChart app! Go to the app store, search "MyChart", open the app, select [facility], and log in with your MyChart username and password.   

## 2022-09-03 NOTE — Addendum Note (Signed)
Addended by: Rana Snare on: 09/03/2022 10:04 AM   Modules accepted: Orders

## 2022-09-03 NOTE — Assessment & Plan Note (Addendum)
04/30/2022: Screening mammogram detected left breast mass: Diagnostic mammogram and ultrasound revealed 2 o'clock position left breast irregular hypoechoic mass with calcification measuring 0.7 cm. Adjacent to this another irregular hypoechoic mass 0.5 cm. Axilla negative. Biopsy revealed grade 3 IDC with DCIS ER 100%, PR 100%, Ki-67 60%, HER2 3+ positive    07/19/2022: Right lumpectomy: Grade 2 IDC, 1 cm with intermediate grade DCIS, ER 95%, PR 90%,, Ki-67 5%, HER2 -, 0/2 lymph nodes negative, anterior margin positive (it is skin so therefore no additional surgery is needed) Left lumpectomy: Grade 3 IDC 1.1 cm with DCIS, margins negative, 0/2 lymph nodes negative, ER 100%, PR 100%, HER2 positive 3+, Ki-67 60% ------------------------------------------------------------------------------------------------------------------------------------------------ Treatment plan: Adjuvant chemotherapy with Taxol Herceptin followed by Herceptin maintenance Adjuvant radiation Adjuvant antiestrogen therapy ------------------------------------------------------------------------  Current treatment: Cycle 4 Taxol Herceptin   Chemo toxicities: Headache with Zofran: She will discontinue Zofran 2. Anemia: It is improving to 10.7 today. 3.  Diarrhea that lasted 3 days which stopped after she started Imodium.  She will start taking Imodium sooner if it happens again. 4.  Alopecia patient decided to shave her head because it was getting matted.   Patient is enrolled in neuropathy trial: No neuropathy Return to clinic weekly for chemo on every other week for follow-up with [place]

## 2022-09-03 NOTE — Progress Notes (Signed)
Per Dr. Pamelia Hoit ok to start premedication without lab results due to error on collection.

## 2022-09-04 ENCOUNTER — Encounter: Payer: Self-pay | Admitting: Hematology and Oncology

## 2022-09-04 ENCOUNTER — Telehealth: Payer: Self-pay | Admitting: Hematology and Oncology

## 2022-09-04 NOTE — Telephone Encounter (Signed)
Scheduled appointments per WQ. Patient is aware of the made appointments.  

## 2022-09-05 ENCOUNTER — Other Ambulatory Visit: Payer: Self-pay

## 2022-09-07 MED FILL — Dexamethasone Sodium Phosphate Inj 100 MG/10ML: INTRAMUSCULAR | Qty: 1 | Status: AC

## 2022-09-10 ENCOUNTER — Other Ambulatory Visit: Payer: Medicare Other

## 2022-09-10 ENCOUNTER — Inpatient Hospital Stay: Payer: Medicare Other

## 2022-09-10 ENCOUNTER — Other Ambulatory Visit (HOSPITAL_COMMUNITY): Payer: Self-pay

## 2022-09-10 ENCOUNTER — Telehealth: Payer: Self-pay | Admitting: Hematology and Oncology

## 2022-09-10 ENCOUNTER — Other Ambulatory Visit: Payer: Self-pay | Admitting: *Deleted

## 2022-09-10 ENCOUNTER — Encounter: Payer: Self-pay | Admitting: Hematology and Oncology

## 2022-09-10 VITALS — BP 114/60 | HR 70 | Temp 97.8°F | Resp 17 | Wt 249.2 lb

## 2022-09-10 DIAGNOSIS — Z5111 Encounter for antineoplastic chemotherapy: Secondary | ICD-10-CM | POA: Diagnosis not present

## 2022-09-10 DIAGNOSIS — R519 Headache, unspecified: Secondary | ICD-10-CM | POA: Diagnosis not present

## 2022-09-10 DIAGNOSIS — Z95828 Presence of other vascular implants and grafts: Secondary | ICD-10-CM

## 2022-09-10 DIAGNOSIS — D649 Anemia, unspecified: Secondary | ICD-10-CM | POA: Diagnosis not present

## 2022-09-10 DIAGNOSIS — Z170 Estrogen receptor positive status [ER+]: Secondary | ICD-10-CM

## 2022-09-10 DIAGNOSIS — Z79620 Long term (current) use of immunosuppressive biologic: Secondary | ICD-10-CM | POA: Diagnosis not present

## 2022-09-10 DIAGNOSIS — C50412 Malignant neoplasm of upper-outer quadrant of left female breast: Secondary | ICD-10-CM | POA: Diagnosis not present

## 2022-09-10 DIAGNOSIS — L659 Nonscarring hair loss, unspecified: Secondary | ICD-10-CM | POA: Diagnosis not present

## 2022-09-10 DIAGNOSIS — Z79899 Other long term (current) drug therapy: Secondary | ICD-10-CM | POA: Diagnosis not present

## 2022-09-10 DIAGNOSIS — Z5112 Encounter for antineoplastic immunotherapy: Secondary | ICD-10-CM | POA: Diagnosis not present

## 2022-09-10 DIAGNOSIS — R5383 Other fatigue: Secondary | ICD-10-CM | POA: Diagnosis not present

## 2022-09-10 DIAGNOSIS — G629 Polyneuropathy, unspecified: Secondary | ICD-10-CM | POA: Diagnosis not present

## 2022-09-10 DIAGNOSIS — Z79633 Long term (current) use of mitotic inhibitor: Secondary | ICD-10-CM | POA: Diagnosis not present

## 2022-09-10 DIAGNOSIS — R197 Diarrhea, unspecified: Secondary | ICD-10-CM | POA: Diagnosis not present

## 2022-09-10 DIAGNOSIS — Z881 Allergy status to other antibiotic agents status: Secondary | ICD-10-CM | POA: Diagnosis not present

## 2022-09-10 LAB — CBC WITH DIFFERENTIAL (CANCER CENTER ONLY)
Abs Immature Granulocytes: 0.12 10*3/uL — ABNORMAL HIGH (ref 0.00–0.07)
Basophils Absolute: 0 10*3/uL (ref 0.0–0.1)
Basophils Relative: 1 %
Eosinophils Absolute: 0 10*3/uL (ref 0.0–0.5)
Eosinophils Relative: 0 %
HCT: 30.3 % — ABNORMAL LOW (ref 36.0–46.0)
Hemoglobin: 10 g/dL — ABNORMAL LOW (ref 12.0–15.0)
Immature Granulocytes: 3 %
Lymphocytes Relative: 30 %
Lymphs Abs: 1.4 10*3/uL (ref 0.7–4.0)
MCH: 28.2 pg (ref 26.0–34.0)
MCHC: 33 g/dL (ref 30.0–36.0)
MCV: 85.4 fL (ref 80.0–100.0)
Monocytes Absolute: 0.4 10*3/uL (ref 0.1–1.0)
Monocytes Relative: 9 %
Neutro Abs: 2.6 10*3/uL (ref 1.7–7.7)
Neutrophils Relative %: 57 %
Platelet Count: 454 10*3/uL — ABNORMAL HIGH (ref 150–400)
RBC: 3.55 MIL/uL — ABNORMAL LOW (ref 3.87–5.11)
RDW: 14.9 % (ref 11.5–15.5)
WBC Count: 4.6 10*3/uL (ref 4.0–10.5)
nRBC: 0 % (ref 0.0–0.2)

## 2022-09-10 LAB — CMP (CANCER CENTER ONLY)
ALT: 33 U/L (ref 0–44)
AST: 19 U/L (ref 15–41)
Albumin: 4.1 g/dL (ref 3.5–5.0)
Alkaline Phosphatase: 87 U/L (ref 38–126)
Anion gap: 7 (ref 5–15)
BUN: 12 mg/dL (ref 8–23)
CO2: 29 mmol/L (ref 22–32)
Calcium: 9.4 mg/dL (ref 8.9–10.3)
Chloride: 102 mmol/L (ref 98–111)
Creatinine: 1.01 mg/dL — ABNORMAL HIGH (ref 0.44–1.00)
GFR, Estimated: >60 mL/min (ref >60)
Glucose, Bld: 111 mg/dL — ABNORMAL HIGH (ref 70–99)
Potassium: 3.5 mmol/L (ref 3.5–5.1)
Sodium: 138 mmol/L (ref 135–145)
Total Bilirubin: 0.4 mg/dL (ref 0.3–1.2)
Total Protein: 6.8 g/dL (ref 6.5–8.1)

## 2022-09-10 MED ORDER — MAGIC MOUTHWASH W/LIDOCAINE: 5.0000 mL | Freq: Four times a day (QID) | ORAL | 2 refills | Status: DC | PRN

## 2022-09-10 MED ORDER — HEPARIN SOD (PORK) LOCK FLUSH 100 UNIT/ML IV SOLN
500.0000 [IU] | Freq: Once | INTRAVENOUS | Status: AC | PRN
  Administered 2022-09-10: 500 [IU]

## 2022-09-10 MED ORDER — SODIUM CHLORIDE 0.9 % IV SOLN: Freq: Once | INTRAVENOUS | Status: AC

## 2022-09-10 MED ORDER — ACETAMINOPHEN 325 MG PO TABS
650.0000 mg | ORAL_TABLET | Freq: Once | ORAL | Status: AC
  Administered 2022-09-10: 650 mg via ORAL
  Filled 2022-09-10: qty 2

## 2022-09-10 MED ORDER — SODIUM CHLORIDE 0.9% FLUSH
10.0000 mL | INTRAVENOUS | Status: DC | PRN
  Administered 2022-09-10: 10 mL

## 2022-09-10 MED ORDER — TRASTUZUMAB-ANNS CHEMO 150 MG IV SOLR
2.0000 mg/kg | Freq: Once | INTRAVENOUS | Status: AC
  Administered 2022-09-10: 231 mg via INTRAVENOUS
  Filled 2022-09-10: qty 11

## 2022-09-10 MED ORDER — SODIUM CHLORIDE 0.9 % IV SOLN
80.0000 mg/m2 | Freq: Once | INTRAVENOUS | Status: AC
  Administered 2022-09-10: 186 mg via INTRAVENOUS
  Filled 2022-09-10: qty 31

## 2022-09-10 MED ORDER — LIDOCAINE VISCOUS HCL 2 % MT SOLN
OROMUCOSAL | 2 refills | Status: DC
  Filled 2022-09-10: qty 240, 12d supply, fill #0
  Filled 2022-09-22: qty 240, 12d supply, fill #1

## 2022-09-10 MED ORDER — FAMOTIDINE IN NACL 20-0.9 MG/50ML-% IV SOLN
20.0000 mg | Freq: Once | INTRAVENOUS | Status: AC
  Administered 2022-09-10: 20 mg via INTRAVENOUS
  Filled 2022-09-10: qty 50

## 2022-09-10 MED ORDER — SODIUM CHLORIDE 0.9 % IV SOLN
10.0000 mg | Freq: Once | INTRAVENOUS | Status: AC
  Administered 2022-09-10: 10 mg via INTRAVENOUS
  Filled 2022-09-10: qty 10

## 2022-09-10 MED ORDER — DIPHENHYDRAMINE HCL 50 MG/ML IJ SOLN
50.0000 mg | Freq: Once | INTRAMUSCULAR | Status: AC
  Administered 2022-09-10: 50 mg via INTRAVENOUS
  Filled 2022-09-10: qty 1

## 2022-09-10 NOTE — Telephone Encounter (Signed)
I saw the patient in the infusion center. Complaining of hoarseness of voice and sore throat with difficulty swallowing. No fevers. I do not see any thrush in the mouth.  Most likely cause of her symptoms is severe GERD versus an esophageal ulcer. Plan: Double the dose of Nexium Magic mouthwash swish and swallow Proceed with chemotherapy

## 2022-09-10 NOTE — Patient Instructions (Signed)
[facility]  Discharge Instructions: Thank you for choosing [facility] to provide your oncology and hematology care.   If you have a lab appointment with the Cancer Center, please go directly to the Cancer Center and check in at the registration area.   Wear comfortable clothing and clothing appropriate for easy access to any Portacath or PICC line.   We strive to give you quality time with your provider. You may need to reschedule your appointment if you arrive late (15 or more minutes).  Arriving late affects you and other patients whose appointments are after yours.  Also, if you miss three or more appointments without notifying the office, you may be dismissed from the clinic at the provider's discretion.      For prescription refill requests, have your pharmacy contact our office and allow 72 hours for refills to be completed.    Today you received the following chemotherapy and/or immunotherapy agents: trastuzumab-anns and paclitaxel      To help prevent nausea and vomiting after your treatment, we encourage you to take your nausea medication as directed.  BELOW ARE SYMPTOMS THAT SHOULD BE REPORTED IMMEDIATELY: *FEVER GREATER THAN 100.4 F (38 C) OR HIGHER *CHILLS OR SWEATING *NAUSEA AND VOMITING THAT IS NOT CONTROLLED WITH YOUR NAUSEA MEDICATION *UNUSUAL SHORTNESS OF BREATH *UNUSUAL BRUISING OR BLEEDING *URINARY PROBLEMS (pain or burning when urinating, or frequent urination) *BOWEL PROBLEMS (unusual diarrhea, constipation, pain near the anus) TENDERNESS IN MOUTH AND THROAT WITH OR WITHOUT PRESENCE OF ULCERS (sore throat, sores in mouth, or a toothache) UNUSUAL RASH, SWELLING OR PAIN  UNUSUAL VAGINAL DISCHARGE OR ITCHING   Items with * indicate a potential emergency and should be followed up as soon as possible or go to the Emergency Department if any problems should occur.  Please show the CHEMOTHERAPY ALERT CARD or  IMMUNOTHERAPY ALERT CARD at check-in to the Emergency Department and triage nurse.  Should you have questions after your visit or need to cancel or reschedule your appointment, please contact [facility]  Dept: [phone]  and follow the prompts.  Office hours are 8:00 a.m. to 4:30 p.m. Monday - Friday. Please note that voicemails left after 4:00 p.m. may not be returned until the following business day.  We are closed weekends and major holidays. You have access to a nurse at all times for urgent questions. Please call the main number to the clinic Dept: [phone] and follow the prompts.   For any non-urgent questions, you may also contact your provider using MyChart. We now offer e-Visits for anyone 18 and older to request care online for non-urgent symptoms. For details visit [email or web address].   Also download the MyChart app! Go to the app store, search "MyChart", open the app, select , and log in with your MyChart username and password.   

## 2022-09-10 NOTE — Research (Unsigned)
TRIAL S2205, ICE COMPRESS: RANDOMIZED TRIAL OF LIMB CRYOCOMPRESSION VERSUS CONTINUOUS COMPRESSION VERSUS LOW CYCLIC COMPRESSION FOR THE PREVENTION  OF TAXANE-INDUCED PERIPHERAL NEUROPATHY   Patient arrives today Accompanied by her husband  for the Week 5 visit. Confirmed no wounds/sores/lesions to extremities.   MD/PROVIDER VISIT: Patient sees Dr Pamelia Hoit for today's visit. He saw her in infusion to follow up on hoarseness/cough/sore throat/swallowing difficulty.   1428 Wraps applied; pre-treatment started. 1438 Tolerable 1508 Taxane started; pt moved to low cyclic compression. 1518 Tolerable 1600 Pt sleeping 1613 Taxane complete; moved to post-treatment. 1643 Treatment complete; wraps removed; no sores/wounds lesions.  ADVERSE EVENTS: [name] reports AE as below. Attribution by Dr Pamelia Hoit.  ADVERSE EVENT LOG:  Christy Lynch [id number]  09/10/2022  Adverse Event Log  Study/Protocol: S2205, ICE COMPRESS: RANDOMIZED TRIAL OF LIMB CRYOCOMPRESSION VERSUS CONTINUOUS COMPRESSION VERSUS LOW CYCLIC COMPRESSION FOR THE PREVENTION  OF TAXANE-INDUCED PERIPHERAL NEUROPATHY Cycle: Week 5  Event Grade Onset Date Resolved Date Drug Name Attribution Treatment Comments  Insomnia Grade 1 08/14/22   Unrelated  Happens only on night of chemo  Diarrhea Grade 1 08/21/22 09/10/22 Paclitaxel and/or trastuzumab Definite    Sore throat Grade 2 09/10/22   Unrelated Magic mouthwash; increase Nexium   Hoarseness Grade 3 09/10/22   Unrelated Magic mouthwash; increase Nexium                       NEURO ASSESSMENT: Not due this visit. Will be due at Week 7 visit. Discussed this plan with patient, who verbalized understanding.  DISPOSITION: Upon completion off all study requirements, patient left the treatment area.   The patient was thanked for their time and continued voluntary participation in this study. [name] has been provided direct contact information and is  encouraged to contact this Nurse for any needs or questions.  Margret Chance Ange Puskas, RN, BSN, [facility] She  Her  Hers Clinical Research Nurse [facility] Direct Dial [phone]  Pager [phone] 09/11/2022 3:54 PM

## 2022-09-10 NOTE — Progress Notes (Signed)
Per MD request, RN sent prescription for magic mouthwash to pharmacy on file. Pt educated and verbalized understanding.

## 2022-09-11 ENCOUNTER — Encounter: Payer: Self-pay | Admitting: Hematology and Oncology

## 2022-09-12 DIAGNOSIS — R5383 Other fatigue: Secondary | ICD-10-CM | POA: Diagnosis not present

## 2022-09-12 DIAGNOSIS — J069 Acute upper respiratory infection, unspecified: Secondary | ICD-10-CM | POA: Diagnosis not present

## 2022-09-12 DIAGNOSIS — Z299 Encounter for prophylactic measures, unspecified: Secondary | ICD-10-CM | POA: Diagnosis not present

## 2022-09-14 MED FILL — Dexamethasone Sodium Phosphate Inj 100 MG/10ML: INTRAMUSCULAR | Qty: 1 | Status: AC

## 2022-09-17 ENCOUNTER — Encounter: Payer: Self-pay | Admitting: Adult Health

## 2022-09-17 ENCOUNTER — Inpatient Hospital Stay (HOSPITAL_BASED_OUTPATIENT_CLINIC_OR_DEPARTMENT_OTHER): Payer: Medicare Other

## 2022-09-17 ENCOUNTER — Other Ambulatory Visit: Payer: Self-pay

## 2022-09-17 ENCOUNTER — Inpatient Hospital Stay: Payer: Medicare Other

## 2022-09-17 ENCOUNTER — Inpatient Hospital Stay: Payer: Medicare Other | Admitting: Adult Health

## 2022-09-17 ENCOUNTER — Inpatient Hospital Stay: Payer: Medicare Other | Attending: Hematology and Oncology

## 2022-09-17 VITALS — BP 124/76 | HR 75 | Resp 17

## 2022-09-17 VITALS — BP 131/72 | HR 80 | Temp 97.4°F | Resp 18 | Ht 67.0 in | Wt 246.7 lb

## 2022-09-17 DIAGNOSIS — Z79633 Long term (current) use of mitotic inhibitor: Secondary | ICD-10-CM | POA: Insufficient documentation

## 2022-09-17 DIAGNOSIS — I10 Essential (primary) hypertension: Secondary | ICD-10-CM | POA: Diagnosis not present

## 2022-09-17 DIAGNOSIS — C50412 Malignant neoplasm of upper-outer quadrant of left female breast: Secondary | ICD-10-CM | POA: Insufficient documentation

## 2022-09-17 DIAGNOSIS — Z823 Family history of stroke: Secondary | ICD-10-CM | POA: Insufficient documentation

## 2022-09-17 DIAGNOSIS — Z5112 Encounter for antineoplastic immunotherapy: Secondary | ICD-10-CM | POA: Insufficient documentation

## 2022-09-17 DIAGNOSIS — Z79899 Other long term (current) drug therapy: Secondary | ICD-10-CM | POA: Insufficient documentation

## 2022-09-17 DIAGNOSIS — Z90710 Acquired absence of both cervix and uterus: Secondary | ICD-10-CM | POA: Diagnosis not present

## 2022-09-17 DIAGNOSIS — Z800 Family history of malignant neoplasm of digestive organs: Secondary | ICD-10-CM | POA: Insufficient documentation

## 2022-09-17 DIAGNOSIS — R197 Diarrhea, unspecified: Secondary | ICD-10-CM | POA: Insufficient documentation

## 2022-09-17 DIAGNOSIS — G4700 Insomnia, unspecified: Secondary | ICD-10-CM | POA: Diagnosis not present

## 2022-09-17 DIAGNOSIS — R5383 Other fatigue: Secondary | ICD-10-CM | POA: Diagnosis not present

## 2022-09-17 DIAGNOSIS — Z170 Estrogen receptor positive status [ER+]: Secondary | ICD-10-CM

## 2022-09-17 DIAGNOSIS — Z8051 Family history of malignant neoplasm of kidney: Secondary | ICD-10-CM | POA: Diagnosis not present

## 2022-09-17 DIAGNOSIS — Z803 Family history of malignant neoplasm of breast: Secondary | ICD-10-CM | POA: Insufficient documentation

## 2022-09-17 DIAGNOSIS — D649 Anemia, unspecified: Secondary | ICD-10-CM | POA: Diagnosis not present

## 2022-09-17 DIAGNOSIS — L659 Nonscarring hair loss, unspecified: Secondary | ICD-10-CM | POA: Insufficient documentation

## 2022-09-17 DIAGNOSIS — Z881 Allergy status to other antibiotic agents status: Secondary | ICD-10-CM | POA: Diagnosis not present

## 2022-09-17 DIAGNOSIS — Z8249 Family history of ischemic heart disease and other diseases of the circulatory system: Secondary | ICD-10-CM | POA: Diagnosis not present

## 2022-09-17 DIAGNOSIS — Z95828 Presence of other vascular implants and grafts: Secondary | ICD-10-CM

## 2022-09-17 DIAGNOSIS — R519 Headache, unspecified: Secondary | ICD-10-CM | POA: Diagnosis not present

## 2022-09-17 DIAGNOSIS — Z5111 Encounter for antineoplastic chemotherapy: Secondary | ICD-10-CM | POA: Insufficient documentation

## 2022-09-17 LAB — CMP (CANCER CENTER ONLY)
ALT: 46 U/L — ABNORMAL HIGH (ref 0–44)
AST: 39 U/L (ref 15–41)
Albumin: 4.2 g/dL (ref 3.5–5.0)
Alkaline Phosphatase: 95 U/L (ref 38–126)
Anion gap: 8 (ref 5–15)
BUN: 20 mg/dL (ref 8–23)
CO2: 26 mmol/L (ref 22–32)
Calcium: 9.5 mg/dL (ref 8.9–10.3)
Chloride: 102 mmol/L (ref 98–111)
Creatinine: 1.26 mg/dL — ABNORMAL HIGH (ref 0.44–1.00)
GFR, Estimated: 47 mL/min — ABNORMAL LOW (ref >60)
Glucose, Bld: 117 mg/dL — ABNORMAL HIGH (ref 70–99)
Potassium: 3.5 mmol/L (ref 3.5–5.1)
Sodium: 136 mmol/L (ref 135–145)
Total Bilirubin: 0.3 mg/dL (ref 0.3–1.2)
Total Protein: 7.1 g/dL (ref 6.5–8.1)

## 2022-09-17 LAB — CBC WITH DIFFERENTIAL (CANCER CENTER ONLY)
Abs Immature Granulocytes: 0.14 10*3/uL — ABNORMAL HIGH (ref 0.00–0.07)
Basophils Absolute: 0.1 10*3/uL (ref 0.0–0.1)
Basophils Relative: 1 %
Eosinophils Absolute: 0 10*3/uL (ref 0.0–0.5)
Eosinophils Relative: 0 %
HCT: 30.9 % — ABNORMAL LOW (ref 36.0–46.0)
Hemoglobin: 10.1 g/dL — ABNORMAL LOW (ref 12.0–15.0)
Immature Granulocytes: 3 %
Lymphocytes Relative: 40 %
Lymphs Abs: 1.9 10*3/uL (ref 0.7–4.0)
MCH: 28.4 pg (ref 26.0–34.0)
MCHC: 32.7 g/dL (ref 30.0–36.0)
MCV: 86.8 fL (ref 80.0–100.0)
Monocytes Absolute: 0.2 10*3/uL (ref 0.1–1.0)
Monocytes Relative: 5 %
Neutro Abs: 2.5 10*3/uL (ref 1.7–7.7)
Neutrophils Relative %: 51 %
Platelet Count: 448 10*3/uL — ABNORMAL HIGH (ref 150–400)
RBC: 3.56 MIL/uL — ABNORMAL LOW (ref 3.87–5.11)
RDW: 15.1 % (ref 11.5–15.5)
WBC Count: 4.9 10*3/uL (ref 4.0–10.5)
nRBC: 0 % (ref 0.0–0.2)

## 2022-09-17 MED ORDER — SODIUM CHLORIDE 0.9% FLUSH
10.0000 mL | INTRAVENOUS | Status: DC | PRN
  Administered 2022-09-17: 10 mL

## 2022-09-17 MED ORDER — SODIUM CHLORIDE 0.9 % IV SOLN: Freq: Once | INTRAVENOUS | Status: AC

## 2022-09-17 MED ORDER — ACETAMINOPHEN 325 MG PO TABS
650.0000 mg | ORAL_TABLET | Freq: Once | ORAL | Status: AC
  Administered 2022-09-17: 650 mg via ORAL
  Filled 2022-09-17: qty 2

## 2022-09-17 MED ORDER — SODIUM CHLORIDE 0.9 % IV SOLN
80.0000 mg/m2 | Freq: Once | INTRAVENOUS | Status: AC
  Administered 2022-09-17: 186 mg via INTRAVENOUS
  Filled 2022-09-17: qty 31

## 2022-09-17 MED ORDER — TRASTUZUMAB-ANNS CHEMO 150 MG IV SOLR
2.0000 mg/kg | Freq: Once | INTRAVENOUS | Status: AC
  Administered 2022-09-17: 231 mg via INTRAVENOUS
  Filled 2022-09-17: qty 11

## 2022-09-17 MED ORDER — SODIUM CHLORIDE 0.9 % IV SOLN
10.0000 mg | Freq: Once | INTRAVENOUS | Status: AC
  Administered 2022-09-17: 10 mg via INTRAVENOUS
  Filled 2022-09-17: qty 10

## 2022-09-17 MED ORDER — FAMOTIDINE 20 MG IN NS 100 ML IVPB
20.0000 mg | Freq: Once | INTRAVENOUS | Status: AC
  Administered 2022-09-17: 20 mg via INTRAVENOUS
  Filled 2022-09-17: qty 100

## 2022-09-17 MED ORDER — HEPARIN SOD (PORK) LOCK FLUSH 100 UNIT/ML IV SOLN
500.0000 [IU] | Freq: Once | INTRAVENOUS | Status: AC | PRN
  Administered 2022-09-17: 500 [IU]

## 2022-09-17 MED ORDER — DIPHENHYDRAMINE HCL 50 MG/ML IJ SOLN
50.0000 mg | Freq: Once | INTRAMUSCULAR | Status: AC
  Administered 2022-09-17: 50 mg via INTRAVENOUS
  Filled 2022-09-17: qty 1

## 2022-09-17 NOTE — Research (Addendum)
TRIAL S2205, ICE COMPRESS: RANDOMIZED TRIAL OF LIMB CRYOCOMPRESSION VERSUS CONTINUOUS COMPRESSION VERSUS LOW CYCLIC COMPRESSION FOR THE PREVENTION  OF TAXANE-INDUCED PERIPHERAL NEUROPATHY   Patient arrives today Accompanied by her husband  for the Week 6 visit. Gave PROs to be completed at home and returned at her Week 7 visit next week, when we will complete her Week 6 neuro assessments. Confirmed no lesions, wounds, or sores to extremities.   1156 Wraps applied; pre-treatment started. 1210 Tolerable. 1237 Taxane started; moved to active treatment phase 1250 Tolerable. 1338 Taxane done; moved to post-treatment. 1408 Treatment complete; wraps removed. No lesions/wounds/sore.  PROs: Per study protocol, no PROs due this visit.     LABS: No study labs due this visit.   MD/PROVIDER VISIT: Patient sees Lillard Anes, DNP for today's visit.   ADVERSE EVENTS: [name] reports AE as below. No new AE this week.  ADVERSE EVENT LOG:  Christy Lynch [id number]  09/17/2022  Adverse Event Log  Study/Protocol: S2205, ICE COMPRESS: RANDOMIZED TRIAL OF LIMB CRYOCOMPRESSION VERSUS CONTINUOUS COMPRESSION VERSUS LOW CYCLIC COMPRESSION FOR THE PREVENTION  OF TAXANE-INDUCED PERIPHERAL NEUROPATHY Cycle: Week 6 Event Grade Onset Date Resolved Date Drug Name Attribution Treatment Comments  Insomnia Grade 1 08/14/22     Unrelated   Happens only on night of chemo  Diarrhea Grade 1 08/21/22 09/10/22 Paclitaxel and/or trastuzumab Definite      Sore throat Grade 2 09/10/22     Unrelated Magic mouthwash; increase Nexium    Hoarseness Grade 3 09/10/22     Unrelated Magic mouthwash; increase Nexium                                        NEURO ASSESSMENT: Not due this visit. Will be due next visit.   GIFT CARD: This study does not provide visit compensation.   DISPOSITION: Upon completion off all study requirements, patient remained in infusion to have her port de-accessed.   The  patient was thanked for their time and continued voluntary participation in this study. [name] has been provided direct contact information and is encouraged to contact this Nurse for any needs or questions.  Margret Chance Rohin Krejci, RN, BSN, [facility] She  Her  Hers Clinical Research Nurse [facility] Direct Dial [phone]  Pager [phone] 09/17/2022 3:53 PM

## 2022-09-17 NOTE — Progress Notes (Signed)
[facility] Cancer Follow up:    Christy Specking, MD [address] [name] [address]   DIAGNOSIS:  Cancer Staging  Malignant neoplasm of upper-outer quadrant of left breast in female, estrogen receptor positive (HCC) Staging form: Breast, AJCC 8th Edition - Clinical: Stage IA (cT1b, cN0, cM0, G3, ER+, PR+, HER2+) - Signed by [name], MD on 05/25/2022 Stage prefix: Initial diagnosis Histologic grading system: 3 grade system   SUMMARY OF ONCOLOGIC HISTORY: Oncology History  Malignant neoplasm of upper-outer quadrant of left breast in female, estrogen receptor positive (HCC)  04/30/2022 Initial Diagnosis   Screening mammogram to treat left breast mass measuring 0.7 cm, adjacent mass 0.5 cm, biopsy of both revealed grade 3 IDC with DCIS ER 100% PR 100% Ki-67 60%, HER2 3+ positive   05/25/2022 Cancer Staging   Staging form: Breast, AJCC 8th Edition - Clinical: Stage IA (cT1b, cN0, cM0, G3, ER+, PR+, HER2+) - Signed by [name], MD on 05/25/2022 Stage prefix: Initial diagnosis Histologic grading system: 3 grade system   07/19/2022 Surgery   Right lumpectomy: Grade 2 IDC, 1 cm with intermediate grade DCIS, ER 95%, PR 90%,, Ki-67 5%, HER2 -, 0/2 lymph nodes negative, anterior margin positive Left lumpectomy: Grade 3 IDC 1.1 cm with DCIS, margins negative, 0/2 lymph nodes negative, ER 100%, PR 100%, HER2 positive 3+, Ki-67 60%   07/29/2022 Genetic Testing   Negative genetic testing on the CancerNext-Expanded+RNAinsight panel.  The report date is July 29, 2022.  The CancerNext-Expanded gene panel offered by [facility] and includes sequencing and rearrangement analysis for the following 77 genes: AIP, ALK, APC*, ATM*, AXIN2, BAP1, BARD1, BMPR1A, BRCA1*, BRCA2*, BRIP1*, CDC73, CDH1*, CDK4, CDKN1B, CDKN2A, CHEK2*, CTNNA1, DICER1, FH, FLCN, KIF1B, LZTR1, MAX, MEN1, MET, MLH1*, MSH2*, MSH3, MSH6*, MUTYH*, NF1*, NF2, NTHL1, PALB2*, PHOX2B, PMS2*, POT1, PRKAR1A, PTCH1, PTEN*,  RAD51C*, RAD51D*, RB1, RET, SDHA, SDHAF2, SDHB, SDHC, SDHD, SMAD4, SMARCA4, SMARCB1, SMARCE1, STK11, SUFU, TMEM127, TP53*, TSC1, TSC2, and VHL (sequencing and deletion/duplication); EGFR, EGLN1, HOXB13, KIT, MITF, PDGFRA, POLD1, and POLE (sequencing only); EPCAM and GREM1 (deletion/duplication only). DNA and RNA analyses performed for * genes.    08/14/2022 -  Chemotherapy   Patient is on Treatment Plan : BREAST Paclitaxel + Trastuzumab q7d / Trastuzumab q21d       CURRENT THERAPY: Weekly Taxol/Herceptin  INTERVAL HISTORY: Christy Lynch 67 y.o. female returns for evaluation prior to receiving her sixth weekly cycle of Taxol and Herceptin.  Her most recent echocardiogram occurred on August 13, 2022 demonstrating a left ventricular ejection fraction of 55 to 60% her global longitudinal strain was normal.  Christy tells me that she is tolerating treatment well.  She is slightly more fatigued but otherwise does not notice any significant issues.  She denies any diarrhea and she is not experiencing any peripheral neuropathy.   Patient Active Problem List   Diagnosis Date Noted   Port-A-Cath in place 08/14/2022   Genetic testing 07/30/2022   Family history of breast cancer 07/18/2022   Family history of colon cancer 07/18/2022   Family history of kidney cancer 07/18/2022   Malignant neoplasm of upper-outer quadrant of left breast in female, estrogen receptor positive (HCC) 05/22/2022   Unilateral primary osteoarthritis, left knee 10/14/2019   Vertigo 08/21/2013   Chest pain 07/02/2013   Essential hypertension 07/02/2013   Hyperlipidemia 07/02/2013    is allergic to flagyl [metronidazole].  MEDICAL HISTORY: Past Medical History:  Diagnosis Date   Breast Cancer    Left  Bursitis of hip    Esophageal reflux    Family history of breast cancer    Family history of colon cancer    Family history of kidney cancer    Fibromyalgia    Hypertension    Other and unspecified  hyperlipidemia     SURGICAL HISTORY: Past Surgical History:  Procedure Laterality Date   ABDOMINAL HYSTERECTOMY     BREAST BIOPSY Left    neg bx early 2000's   BREAST BIOPSY Left 04/30/2022   [place] LT BREAST BX W LOC DEV 1ST LESION IMG BX SPEC US GUIDE 04/30/2022 [facility]   BREAST BIOPSY Left 04/30/2022   [place] LT BREAST BX W LOC DEV EA ADD LESION IMG BX SPEC US GUIDE 04/30/2022 [facility]   BREAST BIOPSY  07/17/2022   MM LT RADIOACTIVE SEED LOC MAMMO GUIDE 07/17/2022 [facility]   BREAST BIOPSY  07/17/2022   MM LT RADIOACTIVE SEED EA ADD LESION LOC MAMMO GUIDE 07/17/2022 [facility]   BREAST BIOPSY  07/17/2022   MM RT RADIOACTIVE SEED LOC MAMMO GUIDE 07/17/2022 [facility]   BREAST LUMPECTOMY WITH RADIOACTIVE SEED AND SENTINEL LYMPH NODE BIOPSY Bilateral 07/19/2022   Procedure: BILATERAL BREAST LUMPECTOMY WITH RADIOACTIVE SEED AND BILATERAL SENTINEL LYMPH NODE BIOPSY;  Surgeon: Almond Lint, MD;  Location: [facility];  Service: General;  Laterality: Bilateral;   LEFT HEART CATH  12/12/2006   PORTACATH PLACEMENT Left 07/19/2022   Procedure: INSERTION PORT-A-CATH;  Surgeon: Almond Lint, MD;  Location:  SURGERY CENTER;  Service: General;  Laterality: Left;    SOCIAL HISTORY: Social History   Socioeconomic History   Marital status: Married    Spouse name: Not on file   Number of children: Not on file   Years of education: Not on file   Highest education level: Not on file  Occupational History   Not on file  Tobacco Use   Smoking status: Former   Smokeless tobacco: Never  Vaping Use   Vaping Use: Never used  Substance and Sexual Activity   Alcohol use: No   Drug use: No   Sexual activity: Not on file  Other Topics Concern   Not on file  Social History Narrative   Not on file   Social Determinants of Health   Financial Resource Strain: Not on file  Food Insecurity: Not on file  Transportation Needs: Not on file  Physical  Activity: Not on file  Stress: Not on file  Social Connections: Not on file  Intimate Partner Violence: Not on file    FAMILY HISTORY: Family History  Problem Relation Age of Onset   Breast cancer Mother 21   Hypertension Mother    Stroke Mother    Kidney disease Father    Kidney cancer Brother 42   Colon cancer Maternal Uncle 52   Breast cancer Cousin        maternal first cousin, dx 30s    Review of Systems  Constitutional:  Positive for fatigue. Negative for appetite change, chills, fever and unexpected weight change.  HENT:   Negative for hearing loss, lump/mass and trouble swallowing.   Eyes:  Negative for eye problems and icterus.  Respiratory:  Negative for chest tightness, cough and shortness of breath.   Cardiovascular:  Negative for chest pain, leg swelling and palpitations.  Gastrointestinal:  Negative for abdominal distention, abdominal pain, constipation, diarrhea, nausea and vomiting.  Endocrine: Negative for hot flashes.  Genitourinary:  Negative for difficulty urinating.   Musculoskeletal:  Negative for arthralgias.  Skin:  Negative for itching and rash.  Neurological:  Negative for dizziness, extremity weakness, headaches and numbness.  Hematological:  Negative for adenopathy. Does not bruise/bleed easily.  Psychiatric/Behavioral:  Negative for depression. The patient is not nervous/anxious.       PHYSICAL EXAMINATION    Vitals:   09/17/22 0950  BP: 131/72  Pulse: 80  Resp: 18  Temp: (!) 97.4 F (36.3 C)  SpO2: 96%    Physical Exam Constitutional:      General: She is not in acute distress.    Appearance: Normal appearance. She is not toxic-appearing.  HENT:     Head: Normocephalic and atraumatic.     Mouth/Throat:     Mouth: Mucous membranes are moist.     Pharynx: Oropharynx is clear. No oropharyngeal exudate or posterior oropharyngeal erythema.  Eyes:     General: No scleral icterus. Cardiovascular:     Rate and Rhythm: Normal rate and  regular rhythm.     Pulses: Normal pulses.     Heart sounds: Normal heart sounds.  Pulmonary:     Effort: Pulmonary effort is normal.     Breath sounds: Normal breath sounds.  Abdominal:     General: Abdomen is flat. Bowel sounds are normal. There is no distension.     Palpations: Abdomen is soft.     Tenderness: There is no abdominal tenderness.  Musculoskeletal:        General: No swelling.     Cervical back: Neck supple.  Lymphadenopathy:     Cervical: No cervical adenopathy.  Skin:    General: Skin is warm and dry.     Findings: No rash.  Neurological:     General: No focal deficit present.     Mental Status: She is alert.  Psychiatric:        Mood and Affect: Mood normal.        Behavior: Behavior normal.     LABORATORY DATA:  CBC    Component Value Date/Time   WBC 4.9 09/17/2022 0930   WBC 6.9 06/11/2022 0840   RBC 3.56 (L) 09/17/2022 0930   HGB 10.1 (L) 09/17/2022 0930   HCT 30.9 (L) 09/17/2022 0930   PLT 448 (H) 09/17/2022 0930   MCV 86.8 09/17/2022 0930   MCH 28.4 09/17/2022 0930   MCHC 32.7 09/17/2022 0930   RDW 15.1 09/17/2022 0930   LYMPHSABS 1.9 09/17/2022 0930   MONOABS 0.2 09/17/2022 0930   EOSABS 0.0 09/17/2022 0930   BASOSABS 0.1 09/17/2022 0930    CMP     Component Value Date/Time   NA 136 09/17/2022 0930   K 3.5 09/17/2022 0930   CL 102 09/17/2022 0930   CO2 26 09/17/2022 0930   GLUCOSE 117 (H) 09/17/2022 0930   BUN 20 09/17/2022 0930   CREATININE 1.26 (H) 09/17/2022 0930   CALCIUM 9.5 09/17/2022 0930   PROT 7.1 09/17/2022 0930   ALBUMIN 4.2 09/17/2022 0930   AST 39 09/17/2022 0930   ALT 46 (H) 09/17/2022 0930   ALKPHOS 95 09/17/2022 0930   BILITOT 0.3 09/17/2022 0930   GFRNONAA 47 (L) 09/17/2022 0930   GFRAA 81 (L) 03/23/2013 1443       ASSESSMENT and THERAPY PLAN:   Malignant neoplasm of upper-outer quadrant of left breast in female, estrogen receptor positive (HCC) Christy is a 67 year old woman with left breast breast  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  She is status post bilateral lumpectomies and now proceeds with adjuvant chemotherapy with Taxol and Herceptin. ------------------------------------------------------------------------------------------------------------------------------------------------ Treatment plan: Breast conserving surgery with lymph node biopsy Adjuvant chemotherapy with Taxol Herceptin followed by Herceptin maintenance Adjuvant radiation Adjuvant antiestrogen therapy ------------------------------------------------------------------------  Current treatment: Cycle 6 Taxol Herceptin   Chemo toxicities: Fatigue: She is managing this with energy conservation which we discussed and I recommended she continue. 2.  Anemia: Hemoglobin is 10.1 today 3.  Slight creatinine increase: She tells me she is not drinking as much water as she should be.  We discussed strategies to improve her water intake and her husband Christy Lynch has agreed to help hold her accountable and improving her water intake this next week. 4.  Alopecia 5.  Diarrhea has resolved she has not had this any further since after her first week of treatment.   Patient is enrolled in neuropathy trial: No neuropathy Return to clinic weekly for chemo on every other week for follow-up with [place]  All questions were answered. The patient knows to call the clinic with any problems, questions or concerns. We can certainly see the patient much sooner if necessary.  Total encounter time:20 minutes*in face-to-face visit time, chart review, lab review, care coordination, order entry, and documentation of the encounter time.    Lillard Anes, NP 09/17/22 10:50 AM Medical Oncology and Hematology [facility] [address] [place], [name] [address] Tel. [phone]    Fax. [phone]  *Total Encounter Time as  defined by the Centers for Medicare and Medicaid Services includes, in addition to the face-to-face time of a patient visit (documented in the note above) non-face-to-face time: obtaining and reviewing outside history, ordering and reviewing medications, tests or procedures, care coordination (communications with other health care professionals or caregivers) and documentation in the medical record.

## 2022-09-17 NOTE — Assessment & Plan Note (Addendum)
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  She is status post bilateral lumpectomies and now proceeds with adjuvant chemotherapy with Taxol and Herceptin. ------------------------------------------------------------------------------------------------------------------------------------------------ Treatment plan: Breast conserving surgery with lymph node biopsy Adjuvant chemotherapy with Taxol Herceptin followed by Herceptin maintenance Adjuvant radiation Adjuvant antiestrogen therapy ------------------------------------------------------------------------  Current treatment: Cycle 6 Taxol Herceptin   Chemo toxicities: Fatigue: She is managing this with energy conservation which we discussed and I recommended she continue. 2.  Anemia: Hemoglobin is 10.1 today 3.  Slight creatinine increase: She tells me she is not drinking as much water as she should be.  We discussed strategies to improve her water intake and her husband Nelma Rothman has agreed to help hold her accountable and improving her water intake this next week. 4.  Alopecia 5.  Diarrhea has resolved she has not had this any further since after her first week of treatment.   Patient is enrolled in neuropathy trial: No neuropathy Return to clinic weekly for chemo on every other week for follow-up with [place]

## 2022-09-17 NOTE — Patient Instructions (Signed)
[facility]  Discharge Instructions: Thank you for choosing [facility] to provide your oncology and hematology care.   If you have a lab appointment with the Cancer Center, please go directly to the Cancer Center and check in at the registration area.   Wear comfortable clothing and clothing appropriate for easy access to any Portacath or PICC line.   We strive to give you quality time with your provider. You may need to reschedule your appointment if you arrive late (15 or more minutes).  Arriving late affects you and other patients whose appointments are after yours.  Also, if you miss three or more appointments without notifying the office, you may be dismissed from the clinic at the provider's discretion.      For prescription refill requests, have your pharmacy contact our office and allow 72 hours for refills to be completed.    Today you received the following chemotherapy and/or immunotherapy agents: trastuzumab-anns and paclitaxel      To help prevent nausea and vomiting after your treatment, we encourage you to take your nausea medication as directed.  BELOW ARE SYMPTOMS THAT SHOULD BE REPORTED IMMEDIATELY: *FEVER GREATER THAN 100.4 F (38 C) OR HIGHER *CHILLS OR SWEATING *NAUSEA AND VOMITING THAT IS NOT CONTROLLED WITH YOUR NAUSEA MEDICATION *UNUSUAL SHORTNESS OF BREATH *UNUSUAL BRUISING OR BLEEDING *URINARY PROBLEMS (pain or burning when urinating, or frequent urination) *BOWEL PROBLEMS (unusual diarrhea, constipation, pain near the anus) TENDERNESS IN MOUTH AND THROAT WITH OR WITHOUT PRESENCE OF ULCERS (sore throat, sores in mouth, or a toothache) UNUSUAL RASH, SWELLING OR PAIN  UNUSUAL VAGINAL DISCHARGE OR ITCHING   Items with * indicate a potential emergency and should be followed up as soon as possible or go to the Emergency Department if any problems should occur.  Please show the CHEMOTHERAPY ALERT CARD or  IMMUNOTHERAPY ALERT CARD at check-in to the Emergency Department and triage nurse.  Should you have questions after your visit or need to cancel or reschedule your appointment, please contact [facility]  Dept: [phone]  and follow the prompts.  Office hours are 8:00 a.m. to 4:30 p.m. Monday - Friday. Please note that voicemails left after 4:00 p.m. may not be returned until the following business day.  We are closed weekends and major holidays. You have access to a nurse at all times for urgent questions. Please call the main number to the clinic Dept: [phone] and follow the prompts.   For any non-urgent questions, you may also contact your provider using MyChart. We now offer e-Visits for anyone 18 and older to request care online for non-urgent symptoms. For details visit [email or web address].   Also download the MyChart app! Go to the app store, search "MyChart", open the app, select [place], and log in with your MyChart username and password.   

## 2022-09-18 ENCOUNTER — Other Ambulatory Visit: Payer: Self-pay

## 2022-09-18 DIAGNOSIS — Z170 Estrogen receptor positive status [ER+]: Secondary | ICD-10-CM

## 2022-09-19 ENCOUNTER — Other Ambulatory Visit: Payer: Self-pay

## 2022-09-21 MED FILL — Dexamethasone Sodium Phosphate Inj 100 MG/10ML: INTRAMUSCULAR | Qty: 1 | Status: AC

## 2022-09-24 ENCOUNTER — Other Ambulatory Visit: Payer: Self-pay

## 2022-09-24 ENCOUNTER — Inpatient Hospital Stay: Payer: Medicare Other

## 2022-09-24 ENCOUNTER — Other Ambulatory Visit (HOSPITAL_COMMUNITY): Payer: Self-pay

## 2022-09-24 ENCOUNTER — Telehealth: Payer: Self-pay

## 2022-09-24 ENCOUNTER — Ambulatory Visit: Payer: Medicare Other

## 2022-09-24 DIAGNOSIS — Z170 Estrogen receptor positive status [ER+]: Secondary | ICD-10-CM

## 2022-09-24 DIAGNOSIS — Z881 Allergy status to other antibiotic agents status: Secondary | ICD-10-CM | POA: Diagnosis not present

## 2022-09-24 DIAGNOSIS — L659 Nonscarring hair loss, unspecified: Secondary | ICD-10-CM | POA: Diagnosis not present

## 2022-09-24 DIAGNOSIS — D649 Anemia, unspecified: Secondary | ICD-10-CM | POA: Diagnosis not present

## 2022-09-24 DIAGNOSIS — Z800 Family history of malignant neoplasm of digestive organs: Secondary | ICD-10-CM | POA: Diagnosis not present

## 2022-09-24 DIAGNOSIS — Z8249 Family history of ischemic heart disease and other diseases of the circulatory system: Secondary | ICD-10-CM | POA: Diagnosis not present

## 2022-09-24 DIAGNOSIS — I10 Essential (primary) hypertension: Secondary | ICD-10-CM | POA: Diagnosis not present

## 2022-09-24 DIAGNOSIS — R519 Headache, unspecified: Secondary | ICD-10-CM | POA: Diagnosis not present

## 2022-09-24 DIAGNOSIS — G4700 Insomnia, unspecified: Secondary | ICD-10-CM | POA: Diagnosis not present

## 2022-09-24 DIAGNOSIS — Z5112 Encounter for antineoplastic immunotherapy: Secondary | ICD-10-CM | POA: Diagnosis not present

## 2022-09-24 DIAGNOSIS — Z8051 Family history of malignant neoplasm of kidney: Secondary | ICD-10-CM | POA: Diagnosis not present

## 2022-09-24 DIAGNOSIS — Z823 Family history of stroke: Secondary | ICD-10-CM | POA: Diagnosis not present

## 2022-09-24 DIAGNOSIS — Z5111 Encounter for antineoplastic chemotherapy: Secondary | ICD-10-CM | POA: Diagnosis not present

## 2022-09-24 DIAGNOSIS — Z79633 Long term (current) use of mitotic inhibitor: Secondary | ICD-10-CM | POA: Diagnosis not present

## 2022-09-24 DIAGNOSIS — R5383 Other fatigue: Secondary | ICD-10-CM | POA: Diagnosis not present

## 2022-09-24 DIAGNOSIS — Z79899 Other long term (current) drug therapy: Secondary | ICD-10-CM | POA: Diagnosis not present

## 2022-09-24 DIAGNOSIS — R197 Diarrhea, unspecified: Secondary | ICD-10-CM | POA: Diagnosis not present

## 2022-09-24 DIAGNOSIS — Z803 Family history of malignant neoplasm of breast: Secondary | ICD-10-CM | POA: Diagnosis not present

## 2022-09-24 DIAGNOSIS — C50412 Malignant neoplasm of upper-outer quadrant of left female breast: Secondary | ICD-10-CM | POA: Diagnosis not present

## 2022-09-24 DIAGNOSIS — Z95828 Presence of other vascular implants and grafts: Secondary | ICD-10-CM

## 2022-09-24 LAB — CMP (CANCER CENTER ONLY)
ALT: 32 U/L (ref 0–44)
AST: 21 U/L (ref 15–41)
Albumin: 3.9 g/dL (ref 3.5–5.0)
Alkaline Phosphatase: 85 U/L (ref 38–126)
Anion gap: 6 (ref 5–15)
BUN: 14 mg/dL (ref 8–23)
CO2: 27 mmol/L (ref 22–32)
Calcium: 8.9 mg/dL (ref 8.9–10.3)
Chloride: 100 mmol/L (ref 98–111)
Creatinine: 1.1 mg/dL — ABNORMAL HIGH (ref 0.44–1.00)
GFR, Estimated: 55 mL/min — ABNORMAL LOW (ref >60)
Glucose, Bld: 107 mg/dL — ABNORMAL HIGH (ref 70–99)
Potassium: 3.7 mmol/L (ref 3.5–5.1)
Sodium: 133 mmol/L — ABNORMAL LOW (ref 135–145)
Total Bilirubin: 0.4 mg/dL (ref 0.3–1.2)
Total Protein: 6.7 g/dL (ref 6.5–8.1)

## 2022-09-24 LAB — CBC WITH DIFFERENTIAL (CANCER CENTER ONLY)
Abs Immature Granulocytes: 0.28 10*3/uL — ABNORMAL HIGH (ref 0.00–0.07)
Basophils Absolute: 0 10*3/uL (ref 0.0–0.1)
Basophils Relative: 1 %
Eosinophils Absolute: 0 10*3/uL (ref 0.0–0.5)
Eosinophils Relative: 1 %
HCT: 27.9 % — ABNORMAL LOW (ref 36.0–46.0)
Hemoglobin: 9.3 g/dL — ABNORMAL LOW (ref 12.0–15.0)
Immature Granulocytes: 6 %
Lymphocytes Relative: 34 %
Lymphs Abs: 1.8 10*3/uL (ref 0.7–4.0)
MCH: 28.7 pg (ref 26.0–34.0)
MCHC: 33.3 g/dL (ref 30.0–36.0)
MCV: 86.1 fL (ref 80.0–100.0)
Monocytes Absolute: 0.4 10*3/uL (ref 0.1–1.0)
Monocytes Relative: 8 %
Neutro Abs: 2.6 10*3/uL (ref 1.7–7.7)
Neutrophils Relative %: 50 %
Platelet Count: 396 10*3/uL (ref 150–400)
RBC: 3.24 MIL/uL — ABNORMAL LOW (ref 3.87–5.11)
RDW: 16.1 % — ABNORMAL HIGH (ref 11.5–15.5)
WBC Count: 5.1 10*3/uL (ref 4.0–10.5)
nRBC: 0 % (ref 0.0–0.2)

## 2022-09-24 LAB — RESEARCH LABS

## 2022-09-24 MED ORDER — SODIUM CHLORIDE 0.9% FLUSH
10.0000 mL | INTRAVENOUS | Status: DC | PRN
  Administered 2022-09-24: 10 mL

## 2022-09-24 MED ORDER — HEPARIN SOD (PORK) LOCK FLUSH 100 UNIT/ML IV SOLN
500.0000 [IU] | Freq: Once | INTRAVENOUS | Status: AC
  Administered 2022-09-24: 500 [IU]

## 2022-09-24 NOTE — Telephone Encounter (Signed)
Z6109, ICE COMPRESS: RANDOMIZED TRIAL OF LIMB CRYOCOMPRESSION VERSUS CONTINUOUS COMPRESSION VERSUS LOW CYCLIC COMPRESSION FOR THE PREVENTION  OF TAXANE-INDUCED PERIPHERAL NEUROPATHY  Patient has not arrived for 1315 port flush/labs. Attempted to call on cell (preferred number); no answer, no option to leave VM. Called home number, no answer, left VM.  Margret Chance Cheo Selvey, RN, BSN, [facility] She  Her  Hers Clinical Research Nurse [facility] Direct Dial [phone]  Pager [phone] 09/24/2022 1:47 PM

## 2022-09-25 ENCOUNTER — Inpatient Hospital Stay: Payer: Medicare Other

## 2022-09-25 VITALS — BP 122/78 | HR 72 | Temp 98.2°F | Resp 18 | Wt 245.7 lb

## 2022-09-25 DIAGNOSIS — Z5111 Encounter for antineoplastic chemotherapy: Secondary | ICD-10-CM | POA: Diagnosis not present

## 2022-09-25 DIAGNOSIS — Z800 Family history of malignant neoplasm of digestive organs: Secondary | ICD-10-CM | POA: Diagnosis not present

## 2022-09-25 DIAGNOSIS — Z8249 Family history of ischemic heart disease and other diseases of the circulatory system: Secondary | ICD-10-CM | POA: Diagnosis not present

## 2022-09-25 DIAGNOSIS — Z79633 Long term (current) use of mitotic inhibitor: Secondary | ICD-10-CM | POA: Diagnosis not present

## 2022-09-25 DIAGNOSIS — Z5112 Encounter for antineoplastic immunotherapy: Secondary | ICD-10-CM | POA: Diagnosis not present

## 2022-09-25 DIAGNOSIS — L659 Nonscarring hair loss, unspecified: Secondary | ICD-10-CM | POA: Diagnosis not present

## 2022-09-25 DIAGNOSIS — I10 Essential (primary) hypertension: Secondary | ICD-10-CM | POA: Diagnosis not present

## 2022-09-25 DIAGNOSIS — C50412 Malignant neoplasm of upper-outer quadrant of left female breast: Secondary | ICD-10-CM | POA: Diagnosis not present

## 2022-09-25 DIAGNOSIS — Z170 Estrogen receptor positive status [ER+]: Secondary | ICD-10-CM

## 2022-09-25 DIAGNOSIS — R197 Diarrhea, unspecified: Secondary | ICD-10-CM | POA: Diagnosis not present

## 2022-09-25 DIAGNOSIS — D649 Anemia, unspecified: Secondary | ICD-10-CM | POA: Diagnosis not present

## 2022-09-25 DIAGNOSIS — G4700 Insomnia, unspecified: Secondary | ICD-10-CM | POA: Diagnosis not present

## 2022-09-25 DIAGNOSIS — R519 Headache, unspecified: Secondary | ICD-10-CM | POA: Diagnosis not present

## 2022-09-25 DIAGNOSIS — Z803 Family history of malignant neoplasm of breast: Secondary | ICD-10-CM | POA: Diagnosis not present

## 2022-09-25 DIAGNOSIS — Z79899 Other long term (current) drug therapy: Secondary | ICD-10-CM | POA: Diagnosis not present

## 2022-09-25 DIAGNOSIS — Z881 Allergy status to other antibiotic agents status: Secondary | ICD-10-CM | POA: Diagnosis not present

## 2022-09-25 DIAGNOSIS — Z8051 Family history of malignant neoplasm of kidney: Secondary | ICD-10-CM | POA: Diagnosis not present

## 2022-09-25 DIAGNOSIS — R5383 Other fatigue: Secondary | ICD-10-CM | POA: Diagnosis not present

## 2022-09-25 DIAGNOSIS — Z823 Family history of stroke: Secondary | ICD-10-CM | POA: Diagnosis not present

## 2022-09-25 MED ORDER — FAMOTIDINE 20 MG IN NS 100 ML IVPB
20.0000 mg | Freq: Once | INTRAVENOUS | Status: AC
  Administered 2022-09-25: 20 mg via INTRAVENOUS
  Filled 2022-09-25: qty 100

## 2022-09-25 MED ORDER — DIPHENHYDRAMINE HCL 50 MG/ML IJ SOLN
50.0000 mg | Freq: Once | INTRAMUSCULAR | Status: AC
  Administered 2022-09-25: 50 mg via INTRAVENOUS
  Filled 2022-09-25: qty 1

## 2022-09-25 MED ORDER — TRASTUZUMAB-ANNS CHEMO 150 MG IV SOLR
2.0000 mg/kg | Freq: Once | INTRAVENOUS | Status: AC
  Administered 2022-09-25: 231 mg via INTRAVENOUS
  Filled 2022-09-25: qty 11

## 2022-09-25 MED ORDER — SODIUM CHLORIDE 0.9% FLUSH
10.0000 mL | INTRAVENOUS | Status: DC | PRN
  Administered 2022-09-25: 10 mL

## 2022-09-25 MED ORDER — SODIUM CHLORIDE 0.9 % IV SOLN
80.0000 mg/m2 | Freq: Once | INTRAVENOUS | Status: AC
  Administered 2022-09-25: 186 mg via INTRAVENOUS
  Filled 2022-09-25: qty 31

## 2022-09-25 MED ORDER — SODIUM CHLORIDE 0.9 % IV SOLN: Freq: Once | INTRAVENOUS | Status: AC

## 2022-09-25 MED ORDER — HEPARIN SOD (PORK) LOCK FLUSH 100 UNIT/ML IV SOLN
500.0000 [IU] | Freq: Once | INTRAVENOUS | Status: AC | PRN
  Administered 2022-09-25: 500 [IU]

## 2022-09-25 MED ORDER — ACETAMINOPHEN 325 MG PO TABS
650.0000 mg | ORAL_TABLET | Freq: Once | ORAL | Status: AC
  Administered 2022-09-25: 650 mg via ORAL
  Filled 2022-09-25: qty 2

## 2022-09-25 MED ORDER — SODIUM CHLORIDE 0.9 % IV SOLN
10.0000 mg | Freq: Once | INTRAVENOUS | Status: AC
  Administered 2022-09-25: 10 mg via INTRAVENOUS
  Filled 2022-09-25: qty 10

## 2022-09-25 NOTE — Patient Instructions (Signed)
[facility]  Discharge Instructions: Thank you for choosing [facility] to provide your oncology and hematology care.   If you have a lab appointment with the Cancer Center, please go directly to the Cancer Center and check in at the registration area.   Wear comfortable clothing and clothing appropriate for easy access to any Portacath or PICC line.   We strive to give you quality time with your provider. You may need to reschedule your appointment if you arrive late (15 or more minutes).  Arriving late affects you and other patients whose appointments are after yours.  Also, if you miss three or more appointments without notifying the office, you may be dismissed from the clinic at the provider's discretion.      For prescription refill requests, have your pharmacy contact our office and allow 72 hours for refills to be completed.    Today you received the following chemotherapy and/or immunotherapy agents: trastuzumab-anns and paclitaxel      To help prevent nausea and vomiting after your treatment, we encourage you to take your nausea medication as directed.  BELOW ARE SYMPTOMS THAT SHOULD BE REPORTED IMMEDIATELY: *FEVER GREATER THAN 100.4 F (38 C) OR HIGHER *CHILLS OR SWEATING *NAUSEA AND VOMITING THAT IS NOT CONTROLLED WITH YOUR NAUSEA MEDICATION *UNUSUAL SHORTNESS OF BREATH *UNUSUAL BRUISING OR BLEEDING *URINARY PROBLEMS (pain or burning when urinating, or frequent urination) *BOWEL PROBLEMS (unusual diarrhea, constipation, pain near the anus) TENDERNESS IN MOUTH AND THROAT WITH OR WITHOUT PRESENCE OF ULCERS (sore throat, sores in mouth, or a toothache) UNUSUAL RASH, SWELLING OR PAIN  UNUSUAL VAGINAL DISCHARGE OR ITCHING   Items with * indicate a potential emergency and should be followed up as soon as possible or go to the Emergency Department if any problems should occur.  Please show the CHEMOTHERAPY ALERT CARD or  IMMUNOTHERAPY ALERT CARD at check-in to the Emergency Department and triage nurse.  Should you have questions after your visit or need to cancel or reschedule your appointment, please contact  [facility]  Dept: [phone]  and follow the prompts.  Office hours are 8:00 a.m. to 4:30 p.m. Monday - Friday. Please note that voicemails left after 4:00 p.m. may not be returned until the following business day.  We are closed weekends and major holidays. You have access to a nurse at all times for urgent questions. Please call the main number to the clinic Dept: [phone] and follow the prompts.   For any non-urgent questions, you may also contact your provider using MyChart. We now offer e-Visits for anyone 18 and older to request care online for non-urgent symptoms. For details visit [email or web address].   Also download the MyChart app! Go to the app store, search "MyChart", open the app, select , and log in with your MyChart username and password.   

## 2022-09-25 NOTE — Research (Addendum)
TRIAL S2205, ICE COMPRESS: RANDOMIZED TRIAL OF LIMB CRYOCOMPRESSION VERSUS CONTINUOUS COMPRESSION VERSUS LOW CYCLIC COMPRESSION FOR THE PREVENTION  OF TAXANE-INDUCED PERIPHERAL NEUROPATHY   Patient arrives today Accompanied by her husband  for the Week 7 treatment and Week 6 assessments. Confirmed no lesions, wounds, or sores to extremities.  1006 Wraps applied; pre treatment started. 1020 Tolerable. 1047 Taxane started; moved to treatment phase. 1100 Tolerable 1147 Taxane done; moved to post-treatment. 1218 Therapy complete; wraps removed. Confirmed no wounds, sore or lesions to extremities.   PROs: Per study protocol, all PROs required for this visit were completed prior to other study activities and completeness has been verified.     LABS: Mandatory and optional labs were collected yesterday per consent and study protocol: [name] tolerated well without complaint.   ADVERSE EVENTS: [name] reports no new AEs.  ADVERSE EVENT LOG:  Christy Lynch [id number]  09/25/2022  Adverse Event Log  Study/Protocol: S2205, ICE COMPRESS: RANDOMIZED TRIAL OF LIMB CRYOCOMPRESSION VERSUS CONTINUOUS COMPRESSION VERSUS LOW CYCLIC COMPRESSION FOR THE PREVENTION  OF TAXANE-INDUCED PERIPHERAL NEUROPATHY Cycle: Treatment 7   Event Grade Onset Date Resolved Date Drug Name Attribution Treatment Comments  Insomnia Grade 1 08/14/22     Unrelated   Happens only on night of chemo  Sore throat Grade 2 09/10/22  09/25/22   Unrelated Magic mouthwash; increase Nexium    Hoarseness Grade 3 09/10/22     Unrelated Magic mouthwash; increase Nexium  No associated pain   NEURO ASSESSMENT: The neuro assessment was completed by this nurse. [name] tolerated all testing without complaint.   GIFT CARD: This study does not provide visit compensation.   DISPOSITION: Upon completion off all study requirements, patient remained in her infusion chair to  complete her chemo treatment.   The patient was thanked for their time and continued voluntary participation in this study. [name] has been provided direct contact information and is encouraged to contact this Nurse for any needs or questions.  Christy Chance Yesica Kemler, RN, BSN, [facility] She  Her  Hers Clinical Research Nurse [facility] Direct Dial [phone]  Pager [phone] 09/25/2022 3:00 PM

## 2022-09-26 NOTE — Progress Notes (Signed)
Patient Care Team: [name], MD as PCP - General (Internal Medicine) [name], MD as Consulting Physician (Hematology and Oncology) Pershing Proud, RN as Oncology Nurse Navigator Donnelly Angelica, RN as Oncology Nurse Navigator  DIAGNOSIS:  Encounter Diagnosis  Name Primary?   Malignant neoplasm of upper-outer quadrant of left breast in female, estrogen receptor positive (HCC) Yes    SUMMARY OF ONCOLOGIC HISTORY: Oncology History  Malignant neoplasm of upper-outer quadrant of left breast in female, estrogen receptor positive (HCC)  04/30/2022 Initial Diagnosis   Screening mammogram to treat left breast mass measuring 0.7 cm, adjacent mass 0.5 cm, biopsy of both revealed grade 3 IDC with DCIS ER 100% PR 100% Ki-67 60%, HER2 3+ positive   05/25/2022 Cancer Staging   Staging form: Breast, AJCC 8th Edition - Clinical: Stage IA (cT1b, cN0, cM0, G3, ER+, PR+, HER2+) - Signed by [name], MD on 05/25/2022 Stage prefix: Initial diagnosis Histologic grading system: 3 grade system   07/19/2022 Surgery   Right lumpectomy: Grade 2 IDC, 1 cm with intermediate grade DCIS, ER 95%, PR 90%,, Ki-67 5%, HER2 -, 0/2 lymph nodes negative, anterior margin positive Left lumpectomy: Grade 3 IDC 1.1 cm with DCIS, margins negative, 0/2 lymph nodes negative, ER 100%, PR 100%, HER2 positive 3+, Ki-67 60%   07/29/2022 Genetic Testing   Negative genetic testing on the CancerNext-Expanded+RNAinsight panel.  The report date is July 29, 2022.  The CancerNext-Expanded gene panel offered by [facility] and includes sequencing and rearrangement analysis for the following 77 genes: AIP, ALK, APC*, ATM*, AXIN2, BAP1, BARD1, BMPR1A, BRCA1*, BRCA2*, BRIP1*, CDC73, CDH1*, CDK4, CDKN1B, CDKN2A, CHEK2*, CTNNA1, DICER1, FH, FLCN, KIF1B, LZTR1, MAX, MEN1, MET, MLH1*, MSH2*, MSH3, MSH6*, MUTYH*, NF1*, NF2, NTHL1, PALB2*, PHOX2B, PMS2*, POT1, PRKAR1A, PTCH1, PTEN*, RAD51C*, RAD51D*, RB1, RET, SDHA, SDHAF2, SDHB,  SDHC, SDHD, SMAD4, SMARCA4, SMARCB1, SMARCE1, STK11, SUFU, TMEM127, TP53*, TSC1, TSC2, and VHL (sequencing and deletion/duplication); EGFR, EGLN1, HOXB13, KIT, MITF, PDGFRA, POLD1, and POLE (sequencing only); EPCAM and GREM1 (deletion/duplication only). DNA and RNA analyses performed for * genes.    08/14/2022 -  Chemotherapy   Patient is on Treatment Plan : BREAST Paclitaxel + Trastuzumab q7d / Trastuzumab q21d       CHIEF COMPLIANT:  Follow-up Cycle 8 Taxol Herceptin   INTERVAL HISTORY: ZARIFA STODOLA is a 67 y.o. female is here because of recent diagnosis of left breast cancer. She presents to the clinic for a follow-up. She reports that she is doing good. She say the diarrhea has subsided. She complains of a head cold. She feels like she is congested. Denies any fevers. She denies numbness and any tingling in fingers. She does have fatigue. She says she can't sleep. Mostly on the days of treatment. Headaches come and goes. She takes tylenol to control it.   ALLERGIES:  is allergic to flagyl [metronidazole].  MEDICATIONS:  Current Outpatient Medications  Medication Sig Dispense Refill   aspirin EC 81 MG tablet Take 81 mg by mouth daily. Swallow whole.     atenolol (TENORMIN) 50 MG tablet Take 50 mg by mouth every evening.     chlorhexidine (PERIDEX) 0.12 % solution 15 mLs 2 (two) times daily.     DULoxetine (CYMBALTA) 60 MG capsule Take 60 mg by mouth every evening.     esomeprazole (NEXIUM) 40 MG capsule Take 40 mg by mouth daily at 12 noon.     lidocaine (XYLOCAINE) 2 % solution 15 mLs.     lidocaine-prilocaine (  EMLA) cream Apply to affected area once 30 g 3   lisinopril-hydrochlorothiazide (ZESTORETIC) 20-25 MG tablet Take 1 tablet by mouth daily.     magic mouthwash (lidocaine, diphenhydrAMINE, alum & mag hydroxide) suspension Swish and swallow 5 mls by mouth 4 times a day as needed for mouth pain 240 mL 2   magic mouthwash w/lidocaine SOLN Take 5 mLs by mouth 4 (four) times  daily as needed for mouth pain. 240 mL 2   Multiple Vitamin (MULTIVITAMIN WITH MINERALS) TABS tablet Take 1 tablet by mouth every evening.     prochlorperazine (COMPAZINE) 10 MG tablet Take 1 tablet (10 mg total) by mouth every 6 (six) hours as needed for nausea or vomiting. 30 tablet 1   No current facility-administered medications for this visit.    PHYSICAL EXAMINATION: ECOG PERFORMANCE STATUS: 1 - Symptomatic but completely ambulatory  Vitals:   10/01/22 1110  BP: 132/66  Pulse: 72  Temp: 97.7 F (36.5 C)  SpO2: 97%   Filed Weights   10/01/22 1110  Weight: 247 lb 3.2 oz (112.1 kg)      LABORATORY DATA:  I have reviewed the data as listed    Latest Ref Rng & Units 09/24/2022    2:12 PM 09/17/2022    9:30 AM 09/10/2022   12:27 PM  CMP  Glucose 70 - 99 mg/dL 478  295  621   BUN 8 - 23 mg/dL 14  20  12    Creatinine 0.44 - 1.00 mg/dL 3.08  6.57  8.46   Sodium 135 - 145 mmol/L 133  136  138   Potassium 3.5 - 5.1 mmol/L 3.7  3.5  3.5   Chloride 98 - 111 mmol/L 100  102  102   CO2 22 - 32 mmol/L 27  26  29    Calcium 8.9 - 10.3 mg/dL 8.9  9.5  9.4   Total Protein 6.5 - 8.1 g/dL 6.7  7.1  6.8   Total Bilirubin 0.3 - 1.2 mg/dL 0.4  0.3  0.4   Alkaline Phos 38 - 126 U/L 85  95  87   AST 15 - 41 U/L 21  39  19   ALT 0 - 44 U/L 32  46  33     Lab Results  Component Value Date   WBC 5.5 10/01/2022   HGB 9.8 (L) 10/01/2022   HCT 28.9 (L) 10/01/2022   MCV 86.8 10/01/2022   PLT 391 10/01/2022   NEUTROABS 3.4 10/01/2022    ASSESSMENT & PLAN:  Malignant neoplasm of upper-outer quadrant of left breast in female, estrogen receptor positive (HCC) 04/30/2022: Screening mammogram detected left breast mass: Diagnostic mammogram and ultrasound revealed 2 o'clock position left breast irregular hypoechoic mass with calcification measuring 0.7 cm. Adjacent to this another irregular hypoechoic mass 0.5 cm. Axilla negative. Biopsy revealed grade 3 IDC with DCIS ER 100%, PR 100%, Ki-67 60%,  HER2 3+ positive    07/19/2022: Right lumpectomy: Grade 2 IDC, 1 cm with intermediate grade DCIS, ER 95%, PR 90%,, Ki-67 5%, HER2 -, 0/2 lymph nodes negative, anterior margin positive (it is skin so therefore no additional surgery is needed) Left lumpectomy: Grade 3 IDC 1.1 cm with DCIS, margins negative, 0/2 lymph nodes negative, ER 100%, PR 100%, HER2 positive 3+, Ki-67 60% ------------------------------------------------------------------------------------------------------------------------------------------------ Treatment plan: Adjuvant chemotherapy with Taxol Herceptin followed by Herceptin maintenance Adjuvant radiation Adjuvant antiestrogen therapy ------------------------------------------------------------------------  Current treatment: Cycle 8 Taxol Herceptin   Chemo toxicities: Headache with Zofran: Improved with discontinuing  Zofran 2. Anemia: It is improving to 9.8 today. 3.  Diarrhea that lasted 3 days which stopped after she started Imodium.  Diarrhea is in much better control 4.  Alopecia   5.  Insomnia: I discontinued dexamethasone and also reduce the Benadryl dose.   Patient is enrolled in neuropathy trial: No neuropathy Return to clinic weekly for chemo on every other week for follow-up with [place]    No orders of the defined types were placed in this encounter.  The patient has a good understanding of the overall plan. she agrees with it. she will call with any problems that may develop before the next visit here. Total time spent: 30 mins including face to face time and time spent for planning, charting and co-ordination of care   Tamsen Meek, MD 10/01/22    I Janan Ridge am acting as a Neurosurgeon for [employer]  I have reviewed the above documentation for accuracy and completeness, and I agree with the above.

## 2022-09-28 MED FILL — Dexamethasone Sodium Phosphate Inj 100 MG/10ML: INTRAMUSCULAR | Qty: 1 | Status: AC

## 2022-10-01 ENCOUNTER — Inpatient Hospital Stay (HOSPITAL_BASED_OUTPATIENT_CLINIC_OR_DEPARTMENT_OTHER): Payer: Medicare Other | Admitting: Hematology and Oncology

## 2022-10-01 ENCOUNTER — Inpatient Hospital Stay: Payer: Medicare Other

## 2022-10-01 ENCOUNTER — Encounter: Payer: Self-pay | Admitting: *Deleted

## 2022-10-01 VITALS — BP 132/66 | HR 72 | Temp 97.7°F | Wt 247.2 lb

## 2022-10-01 VITALS — BP 127/78 | HR 71 | Resp 16

## 2022-10-01 DIAGNOSIS — Z803 Family history of malignant neoplasm of breast: Secondary | ICD-10-CM | POA: Diagnosis not present

## 2022-10-01 DIAGNOSIS — Z8249 Family history of ischemic heart disease and other diseases of the circulatory system: Secondary | ICD-10-CM | POA: Diagnosis not present

## 2022-10-01 DIAGNOSIS — Z170 Estrogen receptor positive status [ER+]: Secondary | ICD-10-CM

## 2022-10-01 DIAGNOSIS — R5383 Other fatigue: Secondary | ICD-10-CM | POA: Diagnosis not present

## 2022-10-01 DIAGNOSIS — Z5112 Encounter for antineoplastic immunotherapy: Secondary | ICD-10-CM | POA: Diagnosis not present

## 2022-10-01 DIAGNOSIS — Z8051 Family history of malignant neoplasm of kidney: Secondary | ICD-10-CM | POA: Diagnosis not present

## 2022-10-01 DIAGNOSIS — D649 Anemia, unspecified: Secondary | ICD-10-CM | POA: Diagnosis not present

## 2022-10-01 DIAGNOSIS — Z800 Family history of malignant neoplasm of digestive organs: Secondary | ICD-10-CM | POA: Diagnosis not present

## 2022-10-01 DIAGNOSIS — Z823 Family history of stroke: Secondary | ICD-10-CM | POA: Diagnosis not present

## 2022-10-01 DIAGNOSIS — C50412 Malignant neoplasm of upper-outer quadrant of left female breast: Secondary | ICD-10-CM | POA: Diagnosis not present

## 2022-10-01 DIAGNOSIS — Z79899 Other long term (current) drug therapy: Secondary | ICD-10-CM | POA: Diagnosis not present

## 2022-10-01 DIAGNOSIS — Z881 Allergy status to other antibiotic agents status: Secondary | ICD-10-CM | POA: Diagnosis not present

## 2022-10-01 DIAGNOSIS — I10 Essential (primary) hypertension: Secondary | ICD-10-CM | POA: Diagnosis not present

## 2022-10-01 DIAGNOSIS — Z5111 Encounter for antineoplastic chemotherapy: Secondary | ICD-10-CM | POA: Diagnosis not present

## 2022-10-01 DIAGNOSIS — R197 Diarrhea, unspecified: Secondary | ICD-10-CM | POA: Diagnosis not present

## 2022-10-01 DIAGNOSIS — Z79633 Long term (current) use of mitotic inhibitor: Secondary | ICD-10-CM | POA: Diagnosis not present

## 2022-10-01 DIAGNOSIS — Z95828 Presence of other vascular implants and grafts: Secondary | ICD-10-CM

## 2022-10-01 DIAGNOSIS — G4700 Insomnia, unspecified: Secondary | ICD-10-CM | POA: Diagnosis not present

## 2022-10-01 DIAGNOSIS — R519 Headache, unspecified: Secondary | ICD-10-CM | POA: Diagnosis not present

## 2022-10-01 DIAGNOSIS — L659 Nonscarring hair loss, unspecified: Secondary | ICD-10-CM | POA: Diagnosis not present

## 2022-10-01 LAB — CBC WITH DIFFERENTIAL (CANCER CENTER ONLY)
Abs Immature Granulocytes: 0.12 10*3/uL — ABNORMAL HIGH (ref 0.00–0.07)
Basophils Absolute: 0.1 10*3/uL (ref 0.0–0.1)
Basophils Relative: 1 %
Eosinophils Absolute: 0.1 10*3/uL (ref 0.0–0.5)
Eosinophils Relative: 1 %
HCT: 28.9 % — ABNORMAL LOW (ref 36.0–46.0)
Hemoglobin: 9.8 g/dL — ABNORMAL LOW (ref 12.0–15.0)
Immature Granulocytes: 2 %
Lymphocytes Relative: 27 %
Lymphs Abs: 1.5 10*3/uL (ref 0.7–4.0)
MCH: 29.4 pg (ref 26.0–34.0)
MCHC: 33.9 g/dL (ref 30.0–36.0)
MCV: 86.8 fL (ref 80.0–100.0)
Monocytes Absolute: 0.4 10*3/uL (ref 0.1–1.0)
Monocytes Relative: 7 %
Neutro Abs: 3.4 10*3/uL (ref 1.7–7.7)
Neutrophils Relative %: 62 %
Platelet Count: 391 10*3/uL (ref 150–400)
RBC: 3.33 MIL/uL — ABNORMAL LOW (ref 3.87–5.11)
RDW: 16.9 % — ABNORMAL HIGH (ref 11.5–15.5)
WBC Count: 5.5 10*3/uL (ref 4.0–10.5)
nRBC: 0.4 % — ABNORMAL HIGH (ref 0.0–0.2)

## 2022-10-01 LAB — CMP (CANCER CENTER ONLY)
ALT: 27 U/L (ref 0–44)
AST: 19 U/L (ref 15–41)
Albumin: 4.1 g/dL (ref 3.5–5.0)
Alkaline Phosphatase: 88 U/L (ref 38–126)
Anion gap: 6 (ref 5–15)
BUN: 13 mg/dL (ref 8–23)
CO2: 28 mmol/L (ref 22–32)
Calcium: 9.4 mg/dL (ref 8.9–10.3)
Chloride: 100 mmol/L (ref 98–111)
Creatinine: 1.02 mg/dL — ABNORMAL HIGH (ref 0.44–1.00)
GFR, Estimated: >60 mL/min (ref >60)
Glucose, Bld: 98 mg/dL (ref 70–99)
Potassium: 3.8 mmol/L (ref 3.5–5.1)
Sodium: 134 mmol/L — ABNORMAL LOW (ref 135–145)
Total Bilirubin: 0.6 mg/dL (ref 0.3–1.2)
Total Protein: 6.9 g/dL (ref 6.5–8.1)

## 2022-10-01 MED ORDER — HEPARIN SOD (PORK) LOCK FLUSH 100 UNIT/ML IV SOLN
500.0000 [IU] | Freq: Once | INTRAVENOUS | Status: AC | PRN
  Administered 2022-10-01: 500 [IU]

## 2022-10-01 MED ORDER — SODIUM CHLORIDE 0.9 % IV SOLN
80.0000 mg/m2 | Freq: Once | INTRAVENOUS | Status: AC
  Administered 2022-10-01: 186 mg via INTRAVENOUS
  Filled 2022-10-01: qty 31

## 2022-10-01 MED ORDER — DIPHENHYDRAMINE HCL 50 MG/ML IJ SOLN
25.0000 mg | Freq: Once | INTRAMUSCULAR | Status: AC
  Administered 2022-10-01: 25 mg via INTRAVENOUS
  Filled 2022-10-01: qty 1

## 2022-10-01 MED ORDER — SODIUM CHLORIDE 0.9 % IV SOLN: Freq: Once | INTRAVENOUS | Status: AC

## 2022-10-01 MED ORDER — FAMOTIDINE 20 MG IN NS 100 ML IVPB
20.0000 mg | Freq: Once | INTRAVENOUS | Status: AC
  Administered 2022-10-01: 20 mg via INTRAVENOUS
  Filled 2022-10-01: qty 100

## 2022-10-01 MED ORDER — ACETAMINOPHEN 325 MG PO TABS
650.0000 mg | ORAL_TABLET | Freq: Once | ORAL | Status: AC
  Administered 2022-10-01: 650 mg via ORAL
  Filled 2022-10-01: qty 2

## 2022-10-01 MED ORDER — SODIUM CHLORIDE 0.9% FLUSH
10.0000 mL | Freq: Once | INTRAVENOUS | Status: AC
  Administered 2022-10-01: 10 mL

## 2022-10-01 MED ORDER — TRASTUZUMAB-ANNS CHEMO 150 MG IV SOLR
2.0000 mg/kg | Freq: Once | INTRAVENOUS | Status: AC
  Administered 2022-10-01: 231 mg via INTRAVENOUS
  Filled 2022-10-01: qty 11

## 2022-10-01 MED ORDER — SODIUM CHLORIDE 0.9% FLUSH
10.0000 mL | INTRAVENOUS | Status: DC | PRN
  Administered 2022-10-01: 10 mL

## 2022-10-01 NOTE — Assessment & Plan Note (Addendum)
04/30/2022: Screening mammogram detected left breast mass: Diagnostic mammogram and ultrasound revealed 2 o'clock position left breast irregular hypoechoic mass with calcification measuring 0.7 cm. Adjacent to this another irregular hypoechoic mass 0.5 cm. Axilla negative. Biopsy revealed grade 3 IDC with DCIS ER 100%, PR 100%, Ki-67 60%, HER2 3+ positive    07/19/2022: Right lumpectomy: Grade 2 IDC, 1 cm with intermediate grade DCIS, ER 95%, PR 90%,, Ki-67 5%, HER2 -, 0/2 lymph nodes negative, anterior margin positive (it is skin so therefore no additional surgery is needed) Left lumpectomy: Grade 3 IDC 1.1 cm with DCIS, margins negative, 0/2 lymph nodes negative, ER 100%, PR 100%, HER2 positive 3+, Ki-67 60% ------------------------------------------------------------------------------------------------------------------------------------------------ Treatment plan: Adjuvant chemotherapy with Taxol Herceptin followed by Herceptin maintenance Adjuvant radiation Adjuvant antiestrogen therapy ------------------------------------------------------------------------  Current treatment: Cycle 8 Taxol Herceptin   Chemo toxicities: Headache with Zofran: Improved with discontinuing Zofran 2. Anemia: It is improving to 9.8 today. 3.  Diarrhea that lasted 3 days which stopped after she started Imodium.  Diarrhea is in much better control 4.  Alopecia   5.  Insomnia: I discontinued dexamethasone and also reduce the Benadryl dose.   Patient is enrolled in neuropathy trial: No neuropathy Return to clinic weekly for chemo on every other week for follow-up with [place]

## 2022-10-01 NOTE — Research (Signed)
TRIAL S2205, ICE COMPRESS: RANDOMIZED TRIAL OF LIMB CRYOCOMPRESSION VERSUS CONTINUOUS COMPRESSION VERSUS LOW CYCLIC COMPRESSION FOR THE PREVENTION  OF TAXANE-INDUCED PERIPHERAL NEUROPATHY   Patient arrives today Accompanied by her brother  for the Week 8 treatment. Confirmed no wounds, sores, lesions to extremities.  1227 Wraps applied; pre-treatment started. 1240 Sleeping; tolerable. 1258 Taxane started; moved to treatment phase. 1312 Tolerable; pt sleeping. 1359 Taxane done; moved to post-treatment. 1429 Post-treatment done; wraps removed. Confirmed no wounds, sores, or lesions to extremities.  MD/PROVIDER VISIT: Patient sees Dr Pamelia Hoit for today's visit.   ADVERSE EVENTS: [name] reports AE as below; no new AE.  ADVERSE EVENT LOG:  Christy Lynch [id number]  10/01/2022  Adverse Event Log  Study/Protocol: S2205, ICE COMPRESS: RANDOMIZED TRIAL OF LIMB CRYOCOMPRESSION VERSUS CONTINUOUS COMPRESSION VERSUS LOW CYCLIC COMPRESSION FOR THE PREVENTION  OF TAXANE-INDUCED PERIPHERAL NEUROPATHY Cycle: Week 8    Event Grade Onset Date Resolved Date Drug Name Attribution Treatment Comments  Insomnia Grade 1 08/14/22     Unrelated   Happens only on night of chemo  Hoarseness Grade 3 09/10/22     Unrelated Magic mouthwash; increase Nexium  No associated pain     DISPOSITION: Upon completion off all study requirements, patient left treatment area.  The patient was thanked for their time and continued voluntary participation in this study. [name] has been provided direct contact information and is encouraged to contact this Nurse for any needs or questions.  Christy Chance Zarayah Lanting, RN, BSN, [facility] She  Her  Hers Clinical Research Nurse [facility] Direct Dial [phone]  Pager [phone] 10/01/2022 3:40 PM

## 2022-10-01 NOTE — Patient Instructions (Signed)
[facility] [facility]  Discharge Instructions: Thank you for choosing [facility] to provide your oncology and hematology care.   If you have a lab appointment with the Cancer Center, please go directly to the Cancer Center and check in at the registration area.   Wear comfortable clothing and clothing appropriate for easy access to any Portacath or PICC line.   We strive to give you quality time with your provider. You may need to reschedule your appointment if you arrive late (15 or more minutes).  Arriving late affects you and other patients whose appointments are after yours.  Also, if you miss three or more appointments without notifying the office, you may be dismissed from the clinic at the provider's discretion.      For prescription refill requests, have your pharmacy contact our office and allow 72 hours for refills to be completed.    Today you received the following chemotherapy and/or immunotherapy agents Kanjinti & Taxol      To help prevent nausea and vomiting after your treatment, we encourage you to take your nausea medication as directed.  BELOW ARE SYMPTOMS THAT SHOULD BE REPORTED IMMEDIATELY: *FEVER GREATER THAN 100.4 F (38 C) OR HIGHER *CHILLS OR SWEATING *NAUSEA AND VOMITING THAT IS NOT CONTROLLED WITH YOUR NAUSEA MEDICATION *UNUSUAL SHORTNESS OF BREATH *UNUSUAL BRUISING OR BLEEDING *URINARY PROBLEMS (pain or burning when urinating, or frequent urination) *BOWEL PROBLEMS (unusual diarrhea, constipation, pain near the anus) TENDERNESS IN MOUTH AND THROAT WITH OR WITHOUT PRESENCE OF ULCERS (sore throat, sores in mouth, or a toothache) UNUSUAL RASH, SWELLING OR PAIN  UNUSUAL VAGINAL DISCHARGE OR ITCHING   Items with * indicate a potential emergency and should be followed up as soon as possible or go to the Emergency Department if any problems should occur.  Please show the CHEMOTHERAPY ALERT CARD or IMMUNOTHERAPY ALERT CARD at  check-in to the Emergency Department and triage nurse.  Should you have questions after your visit or need to cancel or reschedule your appointment, please contact [facility]  Dept: [phone]  and follow the prompts.  Office hours are 8:00 a.m. to 4:30 p.m. Monday - Friday. Please note that voicemails left after 4:00 p.m. may not be returned until the following business day.  We are closed weekends and major holidays. You have access to a nurse at all times for urgent questions. Please call the main number to the clinic Dept: [phone] and follow the prompts.   For any non-urgent questions, you may also contact your provider using MyChart. We now offer e-Visits for anyone 18 and older to request care online for non-urgent symptoms. For details visit [email or web address].   Also download the MyChart app! Go to the app store, search "MyChart", open the app, select [facility], and log in with your MyChart username and password.  

## 2022-10-04 ENCOUNTER — Telehealth: Payer: Self-pay | Admitting: Hematology and Oncology

## 2022-10-04 NOTE — Telephone Encounter (Signed)
Rescheduled appointment per 5/23 secure chat. Left voicemail.

## 2022-10-08 ENCOUNTER — Encounter: Payer: Self-pay | Admitting: Hematology and Oncology

## 2022-10-09 ENCOUNTER — Inpatient Hospital Stay: Payer: Medicare Other

## 2022-10-09 ENCOUNTER — Other Ambulatory Visit: Payer: Self-pay

## 2022-10-09 VITALS — BP 128/79 | HR 71 | Temp 98.2°F | Resp 16 | Wt 244.8 lb

## 2022-10-09 DIAGNOSIS — Z8249 Family history of ischemic heart disease and other diseases of the circulatory system: Secondary | ICD-10-CM | POA: Diagnosis not present

## 2022-10-09 DIAGNOSIS — Z823 Family history of stroke: Secondary | ICD-10-CM | POA: Diagnosis not present

## 2022-10-09 DIAGNOSIS — D649 Anemia, unspecified: Secondary | ICD-10-CM | POA: Diagnosis not present

## 2022-10-09 DIAGNOSIS — Z95828 Presence of other vascular implants and grafts: Secondary | ICD-10-CM

## 2022-10-09 DIAGNOSIS — R519 Headache, unspecified: Secondary | ICD-10-CM | POA: Diagnosis not present

## 2022-10-09 DIAGNOSIS — I10 Essential (primary) hypertension: Secondary | ICD-10-CM | POA: Diagnosis not present

## 2022-10-09 DIAGNOSIS — Z79899 Other long term (current) drug therapy: Secondary | ICD-10-CM | POA: Diagnosis not present

## 2022-10-09 DIAGNOSIS — G4700 Insomnia, unspecified: Secondary | ICD-10-CM | POA: Diagnosis not present

## 2022-10-09 DIAGNOSIS — R5383 Other fatigue: Secondary | ICD-10-CM | POA: Diagnosis not present

## 2022-10-09 DIAGNOSIS — Z79633 Long term (current) use of mitotic inhibitor: Secondary | ICD-10-CM | POA: Diagnosis not present

## 2022-10-09 DIAGNOSIS — Z170 Estrogen receptor positive status [ER+]: Secondary | ICD-10-CM | POA: Diagnosis not present

## 2022-10-09 DIAGNOSIS — Z5111 Encounter for antineoplastic chemotherapy: Secondary | ICD-10-CM | POA: Diagnosis not present

## 2022-10-09 DIAGNOSIS — Z5112 Encounter for antineoplastic immunotherapy: Secondary | ICD-10-CM | POA: Diagnosis not present

## 2022-10-09 DIAGNOSIS — Z8051 Family history of malignant neoplasm of kidney: Secondary | ICD-10-CM | POA: Diagnosis not present

## 2022-10-09 DIAGNOSIS — C50412 Malignant neoplasm of upper-outer quadrant of left female breast: Secondary | ICD-10-CM

## 2022-10-09 DIAGNOSIS — Z881 Allergy status to other antibiotic agents status: Secondary | ICD-10-CM | POA: Diagnosis not present

## 2022-10-09 DIAGNOSIS — R197 Diarrhea, unspecified: Secondary | ICD-10-CM | POA: Diagnosis not present

## 2022-10-09 DIAGNOSIS — Z800 Family history of malignant neoplasm of digestive organs: Secondary | ICD-10-CM | POA: Diagnosis not present

## 2022-10-09 DIAGNOSIS — L659 Nonscarring hair loss, unspecified: Secondary | ICD-10-CM | POA: Diagnosis not present

## 2022-10-09 DIAGNOSIS — Z803 Family history of malignant neoplasm of breast: Secondary | ICD-10-CM | POA: Diagnosis not present

## 2022-10-09 LAB — CMP (CANCER CENTER ONLY)
ALT: 24 U/L (ref 0–44)
AST: 20 U/L (ref 15–41)
Albumin: 4.2 g/dL (ref 3.5–5.0)
Alkaline Phosphatase: 97 U/L (ref 38–126)
Anion gap: 8 (ref 5–15)
BUN: 16 mg/dL (ref 8–23)
CO2: 25 mmol/L (ref 22–32)
Calcium: 9.3 mg/dL (ref 8.9–10.3)
Chloride: 101 mmol/L (ref 98–111)
Creatinine: 1.2 mg/dL — ABNORMAL HIGH (ref 0.44–1.00)
GFR, Estimated: 50 mL/min — ABNORMAL LOW (ref >60)
Glucose, Bld: 112 mg/dL — ABNORMAL HIGH (ref 70–99)
Potassium: 4 mmol/L (ref 3.5–5.1)
Sodium: 134 mmol/L — ABNORMAL LOW (ref 135–145)
Total Bilirubin: 0.5 mg/dL (ref 0.3–1.2)
Total Protein: 6.9 g/dL (ref 6.5–8.1)

## 2022-10-09 LAB — CBC WITH DIFFERENTIAL (CANCER CENTER ONLY)
Abs Immature Granulocytes: 0.49 10*3/uL — ABNORMAL HIGH (ref 0.00–0.07)
Basophils Absolute: 0.1 10*3/uL (ref 0.0–0.1)
Basophils Relative: 1 %
Eosinophils Absolute: 0.1 10*3/uL (ref 0.0–0.5)
Eosinophils Relative: 1 %
HCT: 27.7 % — ABNORMAL LOW (ref 36.0–46.0)
Hemoglobin: 9.3 g/dL — ABNORMAL LOW (ref 12.0–15.0)
Immature Granulocytes: 7 %
Lymphocytes Relative: 30 %
Lymphs Abs: 2 10*3/uL (ref 0.7–4.0)
MCH: 29.2 pg (ref 26.0–34.0)
MCHC: 33.6 g/dL (ref 30.0–36.0)
MCV: 87.1 fL (ref 80.0–100.0)
Monocytes Absolute: 0.5 10*3/uL (ref 0.1–1.0)
Monocytes Relative: 8 %
Neutro Abs: 3.7 10*3/uL (ref 1.7–7.7)
Neutrophils Relative %: 53 %
Platelet Count: 425 10*3/uL — ABNORMAL HIGH (ref 150–400)
RBC: 3.18 MIL/uL — ABNORMAL LOW (ref 3.87–5.11)
RDW: 17.2 % — ABNORMAL HIGH (ref 11.5–15.5)
WBC Count: 6.8 10*3/uL (ref 4.0–10.5)
nRBC: 0.4 % — ABNORMAL HIGH (ref 0.0–0.2)

## 2022-10-09 MED ORDER — PROCHLORPERAZINE MALEATE 10 MG PO TABS
10.0000 mg | ORAL_TABLET | Freq: Four times a day (QID) | ORAL | 1 refills | Status: DC | PRN
Start: 2022-10-09 — End: 2023-02-19

## 2022-10-09 MED ORDER — SODIUM CHLORIDE 0.9% FLUSH
10.0000 mL | INTRAVENOUS | Status: DC | PRN
  Administered 2022-10-09: 10 mL

## 2022-10-09 MED ORDER — FAMOTIDINE IN NACL 20-0.9 MG/50ML-% IV SOLN
20.0000 mg | Freq: Once | INTRAVENOUS | Status: AC
  Administered 2022-10-09: 20 mg via INTRAVENOUS
  Filled 2022-10-09: qty 50

## 2022-10-09 MED ORDER — TRASTUZUMAB-ANNS CHEMO 150 MG IV SOLR
2.0000 mg/kg | Freq: Once | INTRAVENOUS | Status: AC
  Administered 2022-10-09: 231 mg via INTRAVENOUS
  Filled 2022-10-09: qty 11

## 2022-10-09 MED ORDER — ACETAMINOPHEN 325 MG PO TABS
650.0000 mg | ORAL_TABLET | Freq: Once | ORAL | Status: AC
  Administered 2022-10-09: 650 mg via ORAL
  Filled 2022-10-09: qty 2

## 2022-10-09 MED ORDER — SODIUM CHLORIDE 0.9% FLUSH
10.0000 mL | Freq: Once | INTRAVENOUS | Status: AC
  Administered 2022-10-09: 10 mL

## 2022-10-09 MED ORDER — SODIUM CHLORIDE 0.9 % IV SOLN: Freq: Once | INTRAVENOUS | Status: AC

## 2022-10-09 MED ORDER — DIPHENHYDRAMINE HCL 50 MG/ML IJ SOLN
25.0000 mg | Freq: Once | INTRAMUSCULAR | Status: AC
  Administered 2022-10-09: 25 mg via INTRAVENOUS
  Filled 2022-10-09: qty 1

## 2022-10-09 MED ORDER — SODIUM CHLORIDE 0.9 % IV SOLN
80.0000 mg/m2 | Freq: Once | INTRAVENOUS | Status: AC
  Administered 2022-10-09: 186 mg via INTRAVENOUS
  Filled 2022-10-09: qty 31

## 2022-10-09 MED ORDER — HEPARIN SOD (PORK) LOCK FLUSH 100 UNIT/ML IV SOLN
500.0000 [IU] | Freq: Once | INTRAVENOUS | Status: AC | PRN
  Administered 2022-10-09: 500 [IU]

## 2022-10-09 NOTE — Research (Signed)
Z6109, ICE COMPRESS: RANDOMIZED TRIAL OF LIMB CRYOCOMPRESSION VERSUS CONTINUOUS COMPRESSION VERSUS LOW CYCLIC COMPRESSION FOR THE PREVENTION  OF TAXANE-INDUCED PERIPHERAL NEUROPATHY   Patient arrives today Accompanied by her husband  for the Week 9 treatment. Confirmed no lesions, sores, or wounds to extremities.   1446 Wraps applied; pretreatment started. 1500 Tolerable. 1521 Taxane started; moved to treatment phase. 1535 Tolerable. 1621 Taxane done; moved to post-treatment. 1651 Done; wraps removed. No wounds, sores, or lesions to extremities.   ADVERSE EVENTS: [name] reports no new AEs, and below AEs have resolved with cessation of decadron last week.  ADVERSE EVENT LOGELIZBETH GAUTHIER [id number]  10/09/2022  Adverse Event Log  Study/Protocol: S2205, ICE COMPRESS: RANDOMIZED TRIAL OF LIMB CRYOCOMPRESSION VERSUS CONTINUOUS COMPRESSION VERSUS LOW CYCLIC COMPRESSION FOR THE PREVENTION  OF TAXANE-INDUCED PERIPHERAL NEUROPATHY Cycle: Week 9   Event Grade Onset Date Resolved Date Drug Name Attribution Treatment Comments  Insomnia Grade 1 08/14/22  10/09/22   Unrelated   Happens only on night of chemo  Hoarseness Grade 3 09/10/22  10/09/22   Unrelated Magic mouthwash; increase Nexium  No associated pain    NEURO ASSESSMENT: The neuro assessment not due at this visit.   DISPOSITION: Upon completion off all study requirements, patient walked to restroom and left the cancer center.   The patient was thanked for their time and continued voluntary participation in this study. [name] has been provided direct contact information and is encouraged to contact this Nurse for any needs or questions.  Margret Chance Nthony Lefferts, RN, BSN, [facility] She  Her  Hers Clinical Research Nurse [facility] Direct Dial [phone]  Pager [phone] 10/09/2022 5:06 PM

## 2022-10-09 NOTE — Patient Instructions (Signed)
[facility]  Discharge Instructions: Thank you for choosing [facility] to provide your oncology and hematology care.   If you have a lab appointment with the Cancer Center, please go directly to the Cancer Center and check in at the registration area.   Wear comfortable clothing and clothing appropriate for easy access to any Portacath or PICC line.   We strive to give you quality time with your provider. You may need to reschedule your appointment if you arrive late (15 or more minutes).  Arriving late affects you and other patients whose appointments are after yours.  Also, if you miss three or more appointments without notifying the office, you may be dismissed from the clinic at the provider's discretion.      For prescription refill requests, have your pharmacy contact our office and allow 72 hours for refills to be completed.    Today you received the following chemotherapy and/or immunotherapy agents: trastuzumab and paclitaxel      To help prevent nausea and vomiting after your treatment, we encourage you to take your nausea medication as directed.  BELOW ARE SYMPTOMS THAT SHOULD BE REPORTED IMMEDIATELY: *FEVER GREATER THAN 100.4 F (38 C) OR HIGHER *CHILLS OR SWEATING *NAUSEA AND VOMITING THAT IS NOT CONTROLLED WITH YOUR NAUSEA MEDICATION *UNUSUAL SHORTNESS OF BREATH *UNUSUAL BRUISING OR BLEEDING *URINARY PROBLEMS (pain or burning when urinating, or frequent urination) *BOWEL PROBLEMS (unusual diarrhea, constipation, pain near the anus) TENDERNESS IN MOUTH AND THROAT WITH OR WITHOUT PRESENCE OF ULCERS (sore throat, sores in mouth, or a toothache) UNUSUAL RASH, SWELLING OR PAIN  UNUSUAL VAGINAL DISCHARGE OR ITCHING   Items with * indicate a potential emergency and should be followed up as soon as possible or go to the Emergency Department if any problems should occur.  Please show the CHEMOTHERAPY ALERT CARD or IMMUNOTHERAPY  ALERT CARD at check-in to the Emergency Department and triage nurse.  Should you have questions after your visit or need to cancel or reschedule your appointment, please contact [facility]  Dept: [phone]  and follow the prompts.  Office hours are 8:00 a.m. to 4:30 p.m. Monday - Friday. Please note that voicemails left after 4:00 p.m. may not be returned until the following business day.  We are closed weekends and major holidays. You have access to a nurse at all times for urgent questions. Please call the main number to the clinic Dept: [phone] and follow the prompts.   For any non-urgent questions, you may also contact your provider using MyChart. We now offer e-Visits for anyone 18 and older to request care online for non-urgent symptoms. For details visit [email or web address].   Also download the MyChart app! Go to the app store, search "MyChart", open the app, select [facility], and log in with your MyChart username and password.   

## 2022-10-15 ENCOUNTER — Inpatient Hospital Stay: Payer: Medicare Other

## 2022-10-15 ENCOUNTER — Other Ambulatory Visit: Payer: Self-pay

## 2022-10-15 ENCOUNTER — Inpatient Hospital Stay (HOSPITAL_BASED_OUTPATIENT_CLINIC_OR_DEPARTMENT_OTHER): Payer: Medicare Other | Admitting: Adult Health

## 2022-10-15 ENCOUNTER — Encounter: Payer: Self-pay | Admitting: Adult Health

## 2022-10-15 ENCOUNTER — Inpatient Hospital Stay: Payer: Medicare Other | Attending: Hematology and Oncology

## 2022-10-15 VITALS — BP 136/63 | HR 71 | Temp 97.3°F | Resp 20 | Ht 67.0 in | Wt 243.1 lb

## 2022-10-15 VITALS — BP 119/70 | HR 69 | Temp 98.0°F | Resp 18

## 2022-10-15 DIAGNOSIS — C50412 Malignant neoplasm of upper-outer quadrant of left female breast: Secondary | ICD-10-CM

## 2022-10-15 DIAGNOSIS — Z90710 Acquired absence of both cervix and uterus: Secondary | ICD-10-CM | POA: Diagnosis not present

## 2022-10-15 DIAGNOSIS — G620 Drug-induced polyneuropathy: Secondary | ICD-10-CM

## 2022-10-15 DIAGNOSIS — Z5111 Encounter for antineoplastic chemotherapy: Secondary | ICD-10-CM | POA: Diagnosis not present

## 2022-10-15 DIAGNOSIS — Z8051 Family history of malignant neoplasm of kidney: Secondary | ICD-10-CM | POA: Diagnosis not present

## 2022-10-15 DIAGNOSIS — E876 Hypokalemia: Secondary | ICD-10-CM | POA: Insufficient documentation

## 2022-10-15 DIAGNOSIS — Z841 Family history of disorders of kidney and ureter: Secondary | ICD-10-CM | POA: Insufficient documentation

## 2022-10-15 DIAGNOSIS — Z79633 Long term (current) use of mitotic inhibitor: Secondary | ICD-10-CM | POA: Diagnosis not present

## 2022-10-15 DIAGNOSIS — Z8249 Family history of ischemic heart disease and other diseases of the circulatory system: Secondary | ICD-10-CM | POA: Insufficient documentation

## 2022-10-15 DIAGNOSIS — Z95828 Presence of other vascular implants and grafts: Secondary | ICD-10-CM

## 2022-10-15 DIAGNOSIS — Z803 Family history of malignant neoplasm of breast: Secondary | ICD-10-CM | POA: Insufficient documentation

## 2022-10-15 DIAGNOSIS — Z79620 Long term (current) use of immunosuppressive biologic: Secondary | ICD-10-CM | POA: Diagnosis not present

## 2022-10-15 DIAGNOSIS — Z5112 Encounter for antineoplastic immunotherapy: Secondary | ICD-10-CM | POA: Insufficient documentation

## 2022-10-15 DIAGNOSIS — I10 Essential (primary) hypertension: Secondary | ICD-10-CM | POA: Diagnosis not present

## 2022-10-15 DIAGNOSIS — Z800 Family history of malignant neoplasm of digestive organs: Secondary | ICD-10-CM | POA: Insufficient documentation

## 2022-10-15 DIAGNOSIS — G479 Sleep disorder, unspecified: Secondary | ICD-10-CM | POA: Insufficient documentation

## 2022-10-15 DIAGNOSIS — T451X5A Adverse effect of antineoplastic and immunosuppressive drugs, initial encounter: Secondary | ICD-10-CM | POA: Insufficient documentation

## 2022-10-15 DIAGNOSIS — Z170 Estrogen receptor positive status [ER+]: Secondary | ICD-10-CM | POA: Insufficient documentation

## 2022-10-15 DIAGNOSIS — Z823 Family history of stroke: Secondary | ICD-10-CM | POA: Insufficient documentation

## 2022-10-15 DIAGNOSIS — R051 Acute cough: Secondary | ICD-10-CM | POA: Diagnosis not present

## 2022-10-15 DIAGNOSIS — Z79899 Other long term (current) drug therapy: Secondary | ICD-10-CM | POA: Insufficient documentation

## 2022-10-15 DIAGNOSIS — R5383 Other fatigue: Secondary | ICD-10-CM | POA: Insufficient documentation

## 2022-10-15 LAB — CMP (CANCER CENTER ONLY)
ALT: 20 U/L (ref 0–44)
AST: 17 U/L (ref 15–41)
Albumin: 4.2 g/dL (ref 3.5–5.0)
Alkaline Phosphatase: 87 U/L (ref 38–126)
Anion gap: 6 (ref 5–15)
BUN: 13 mg/dL (ref 8–23)
CO2: 28 mmol/L (ref 22–32)
Calcium: 9.9 mg/dL (ref 8.9–10.3)
Chloride: 101 mmol/L (ref 98–111)
Creatinine: 1.11 mg/dL — ABNORMAL HIGH (ref 0.44–1.00)
GFR, Estimated: 54 mL/min — ABNORMAL LOW (ref >60)
Glucose, Bld: 99 mg/dL (ref 70–99)
Potassium: 3.5 mmol/L (ref 3.5–5.1)
Sodium: 135 mmol/L (ref 135–145)
Total Bilirubin: 0.7 mg/dL (ref 0.3–1.2)
Total Protein: 7.3 g/dL (ref 6.5–8.1)

## 2022-10-15 LAB — CBC WITH DIFFERENTIAL (CANCER CENTER ONLY)
Abs Immature Granulocytes: 0.12 10*3/uL — ABNORMAL HIGH (ref 0.00–0.07)
Basophils Absolute: 0.1 10*3/uL (ref 0.0–0.1)
Basophils Relative: 1 %
Eosinophils Absolute: 0 10*3/uL (ref 0.0–0.5)
Eosinophils Relative: 1 %
HCT: 28.2 % — ABNORMAL LOW (ref 36.0–46.0)
Hemoglobin: 9.5 g/dL — ABNORMAL LOW (ref 12.0–15.0)
Immature Granulocytes: 3 %
Lymphocytes Relative: 30 %
Lymphs Abs: 1.3 10*3/uL (ref 0.7–4.0)
MCH: 29.5 pg (ref 26.0–34.0)
MCHC: 33.7 g/dL (ref 30.0–36.0)
MCV: 87.6 fL (ref 80.0–100.0)
Monocytes Absolute: 0.4 10*3/uL (ref 0.1–1.0)
Monocytes Relative: 8 %
Neutro Abs: 2.6 10*3/uL (ref 1.7–7.7)
Neutrophils Relative %: 57 %
Platelet Count: 326 10*3/uL (ref 150–400)
RBC: 3.22 MIL/uL — ABNORMAL LOW (ref 3.87–5.11)
RDW: 17.3 % — ABNORMAL HIGH (ref 11.5–15.5)
WBC Count: 4.4 10*3/uL (ref 4.0–10.5)
nRBC: 0.5 % — ABNORMAL HIGH (ref 0.0–0.2)

## 2022-10-15 MED ORDER — TRASTUZUMAB-ANNS CHEMO 150 MG IV SOLR
2.0000 mg/kg | Freq: Once | INTRAVENOUS | Status: AC
  Administered 2022-10-15: 231 mg via INTRAVENOUS
  Filled 2022-10-15: qty 11

## 2022-10-15 MED ORDER — SODIUM CHLORIDE 0.9% FLUSH
10.0000 mL | Freq: Once | INTRAVENOUS | Status: AC
  Administered 2022-10-15: 10 mL

## 2022-10-15 MED ORDER — SODIUM CHLORIDE 0.9% FLUSH
10.0000 mL | INTRAVENOUS | Status: DC | PRN
  Administered 2022-10-15: 10 mL

## 2022-10-15 MED ORDER — FAMOTIDINE IN NACL 20-0.9 MG/50ML-% IV SOLN
20.0000 mg | Freq: Once | INTRAVENOUS | Status: AC
  Administered 2022-10-15: 20 mg via INTRAVENOUS
  Filled 2022-10-15: qty 50

## 2022-10-15 MED ORDER — DIPHENHYDRAMINE HCL 50 MG/ML IJ SOLN
25.0000 mg | Freq: Once | INTRAMUSCULAR | Status: AC
  Administered 2022-10-15: 25 mg via INTRAVENOUS
  Filled 2022-10-15: qty 1

## 2022-10-15 MED ORDER — SODIUM CHLORIDE 0.9 % IV SOLN
65.0000 mg/m2 | Freq: Once | INTRAVENOUS | Status: AC
  Administered 2022-10-15: 150 mg via INTRAVENOUS
  Filled 2022-10-15: qty 25

## 2022-10-15 MED ORDER — PACLITAXEL CHEMO INJECTION 300 MG/50ML
80.0000 mg/m2 | Freq: Once | INTRAVENOUS | Status: DC
Start: 2022-10-15 — End: 2022-10-15

## 2022-10-15 MED ORDER — HEPARIN SOD (PORK) LOCK FLUSH 100 UNIT/ML IV SOLN
500.0000 [IU] | Freq: Once | INTRAVENOUS | Status: AC | PRN
  Administered 2022-10-15: 500 [IU]

## 2022-10-15 MED ORDER — SODIUM CHLORIDE 0.9 % IV SOLN: Freq: Once | INTRAVENOUS | Status: AC

## 2022-10-15 MED ORDER — ACETAMINOPHEN 325 MG PO TABS
650.0000 mg | ORAL_TABLET | Freq: Once | ORAL | Status: AC
  Administered 2022-10-15: 650 mg via ORAL
  Filled 2022-10-15: qty 2

## 2022-10-15 NOTE — Patient Instructions (Signed)
[facility]  Discharge Instructions: Thank you for choosing [facility] to provide your oncology and hematology care.   If you have a lab appointment with the Cancer Center, please go directly to the Cancer Center and check in at the registration area.   Wear comfortable clothing and clothing appropriate for easy access to any Portacath or PICC line.   We strive to give you quality time with your provider. You may need to reschedule your appointment if you arrive late (15 or more minutes).  Arriving late affects you and other patients whose appointments are after yours.  Also, if you miss three or more appointments without notifying the office, you may be dismissed from the clinic at the provider's discretion.      For prescription refill requests, have your pharmacy contact our office and allow 72 hours for refills to be completed.    Today you received the following chemotherapy and/or immunotherapy agents: trastuzumab and paclitaxel      To help prevent nausea and vomiting after your treatment, we encourage you to take your nausea medication as directed.  BELOW ARE SYMPTOMS THAT SHOULD BE REPORTED IMMEDIATELY: *FEVER GREATER THAN 100.4 F (38 C) OR HIGHER *CHILLS OR SWEATING *NAUSEA AND VOMITING THAT IS NOT CONTROLLED WITH YOUR NAUSEA MEDICATION *UNUSUAL SHORTNESS OF BREATH *UNUSUAL BRUISING OR BLEEDING *URINARY PROBLEMS (pain or burning when urinating, or frequent urination) *BOWEL PROBLEMS (unusual diarrhea, constipation, pain near the anus) TENDERNESS IN MOUTH AND THROAT WITH OR WITHOUT PRESENCE OF ULCERS (sore throat, sores in mouth, or a toothache) UNUSUAL RASH, SWELLING OR PAIN  UNUSUAL VAGINAL DISCHARGE OR ITCHING   Items with * indicate a potential emergency and should be followed up as soon as possible or go to the Emergency Department if any problems should occur.  Please show the CHEMOTHERAPY ALERT CARD or IMMUNOTHERAPY  ALERT CARD at check-in to the Emergency Department and triage nurse.  Should you have questions after your visit or need to cancel or reschedule your appointment, please contact [facility]  Dept: [phone]  and follow the prompts.  Office hours are 8:00 a.m. to 4:30 p.m. Monday - Friday. Please note that voicemails left after 4:00 p.m. may not be returned until the following business day.  We are closed weekends and major holidays. You have access to a nurse at all times for urgent questions. Please call the main number to the clinic Dept: [phone] and follow the prompts.   For any non-urgent questions, you may also contact your provider using MyChart. We now offer e-Visits for anyone 18 and older to request care online for non-urgent symptoms. For details visit [email or web address].   Also download the MyChart app! Go to the app store, search "MyChart", open the app, select , and log in with your MyChart username and password.   

## 2022-10-15 NOTE — Research (Signed)
W0981, ICE COMPRESS: RANDOMIZED TRIAL OF LIMB CRYOCOMPRESSION VERSUS CONTINUOUS COMPRESSION VERSUS LOW CYCLIC COMPRESSION FOR THE PREVENTION  OF TAXANE-INDUCED PERIPHERAL NEUROPATHY   Patient arrives today Accompanied by her brother  for the Week 10 visit. Confirmed no wounds, sores, or lesions to extremities.  1258 Wraps applied; pre-treatment started. 1310 Tolerable. 1338 Taxane started; moved to treatment phase. 1350 Tolerable. 1441 Taxane done; moved to post-treatment. 1511 Wraps removed. Confirmed no wounds, lesions, or sores to extremities.   MD/PROVIDER VISIT: Patient sees Lillard Anes, NP for today's visit.   ADVERSE EVENTS: [name] reports no AEs.  ADVERSE EVENT LOG:  Christy Lynch [id number]  10/15/2022  Adverse Event Log  Study/Protocol: S2205, ICE COMPRESS: RANDOMIZED TRIAL OF LIMB CRYOCOMPRESSION VERSUS CONTINUOUS COMPRESSION VERSUS LOW CYCLIC COMPRESSION FOR THE PREVENTION  OF TAXANE-INDUCED PERIPHERAL NEUROPATHY Cycle: Week 10  Event Grade Onset Date Resolved Date Drug Name Attribution Treatment Comments                                                          DISPOSITION: Upon completion off all study requirements, patient walked to the restroom and will then meet her brother at the entrance..   The patient was thanked for their time and continued voluntary participation in this study. [name] has been provided direct contact information and is encouraged to contact this Nurse for any needs or questions.  [name], RN, BSN, [facility] She  Her  Hers Clinical Research Nurse [facility] Direct Dial [phone]  Pager [phone] 10/15/2022 3:31 PM

## 2022-10-15 NOTE — Assessment & Plan Note (Signed)
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  She is status post bilateral lumpectomies and now proceeds with adjuvant chemotherapy with Taxol and Herceptin. ------------------------------------------------------------------------------------------------------------------------------------------------ Treatment plan: Breast conserving surgery with lymph node biopsy Adjuvant chemotherapy with Taxol Herceptin followed by Herceptin maintenance Adjuvant radiation Adjuvant antiestrogen therapy ------------------------------------------------------------------------  Current treatment: Cycle 10 Taxol Herceptin   Chemo toxicities: Fatigue: We discussed energy conservation today.  I also recommended short periods of activity to help recondition her body and also provide energy. 2.  Anemia: hemoglobin is stable at 9.5 3.  Slight creatinine increase: stable, recommended she decrease tea intake and increase water intake 4.  Alopecia 5.  Diarrhea: resolved 6.  Difficulty sleeping: suggested she decrease caffeinated tea she drinks during the day by 50%.  I recommended small activities throughout the day to recondition and hopefully improve her circadian rhythm.   7 CIPN: Patient is enrolled in neuropathy trial: Mild neuropathy, intermittent.  Chemotherapy dose reduced to 65mg /m2 after discussion with Dr. Pamelia Hoit.   RTC in 1 week for labs, f/u, and cycle 11 of Taxol/Herceptin.

## 2022-10-15 NOTE — Progress Notes (Signed)
[facility] Cancer Follow up:    Christy Specking, MD [address] [name] [address]   DIAGNOSIS:  Cancer Staging  Malignant neoplasm of upper-outer quadrant of left breast in female, estrogen receptor positive (HCC) Staging form: Breast, AJCC 8th Edition - Clinical: Stage IA (cT1b, cN0, cM0, G3, ER+, PR+, HER2+) - Signed by [name], MD on 05/25/2022 Stage prefix: Initial diagnosis Histologic grading system: 3 grade system   SUMMARY OF ONCOLOGIC HISTORY: Oncology History  Malignant neoplasm of upper-outer quadrant of left breast in female, estrogen receptor positive (HCC)  04/30/2022 Initial Diagnosis   Screening mammogram to treat left breast mass measuring 0.7 cm, adjacent mass 0.5 cm, biopsy of both revealed grade 3 IDC with DCIS ER 100% PR 100% Ki-67 60%, HER2 3+ positive   05/25/2022 Cancer Staging   Staging form: Breast, AJCC 8th Edition - Clinical: Stage IA (cT1b, cN0, cM0, G3, ER+, PR+, HER2+) - Signed by [name], MD on 05/25/2022 Stage prefix: Initial diagnosis Histologic grading system: 3 grade system   07/19/2022 Surgery   Right lumpectomy: Grade 2 IDC, 1 cm with intermediate grade DCIS, ER 95%, PR 90%,, Ki-67 5%, HER2 -, 0/2 lymph nodes negative, anterior margin positive Left lumpectomy: Grade 3 IDC 1.1 cm with DCIS, margins negative, 0/2 lymph nodes negative, ER 100%, PR 100%, HER2 positive 3+, Ki-67 60%   07/29/2022 Genetic Testing   Negative genetic testing on the CancerNext-Expanded+RNAinsight panel.  The report date is July 29, 2022.  The CancerNext-Expanded gene panel offered by [facility] and includes sequencing and rearrangement analysis for the following 77 genes: AIP, ALK, APC*, ATM*, AXIN2, BAP1, BARD1, BMPR1A, BRCA1*, BRCA2*, BRIP1*, CDC73, CDH1*, CDK4, CDKN1B, CDKN2A, CHEK2*, CTNNA1, DICER1, FH, FLCN, KIF1B, LZTR1, MAX, MEN1, MET, MLH1*, MSH2*, MSH3, MSH6*, MUTYH*, NF1*, NF2, NTHL1, PALB2*, PHOX2B, PMS2*, POT1, PRKAR1A, PTCH1, PTEN*,  RAD51C*, RAD51D*, RB1, RET, SDHA, SDHAF2, SDHB, SDHC, SDHD, SMAD4, SMARCA4, SMARCB1, SMARCE1, STK11, SUFU, TMEM127, TP53*, TSC1, TSC2, and VHL (sequencing and deletion/duplication); EGFR, EGLN1, HOXB13, KIT, MITF, PDGFRA, POLD1, and POLE (sequencing only); EPCAM and GREM1 (deletion/duplication only). DNA and RNA analyses performed for * genes.    08/14/2022 -  Chemotherapy   Patient is on Treatment Plan : BREAST Paclitaxel + Trastuzumab q7d / Trastuzumab q21d       CURRENT THERAPY: Week 10 Taxol/Herceptin  INTERVAL HISTORY: Christy Lynch 67 y.o. female returns for f/u prior to receiving Taxol/herceptin.  Most recent echocardiogram occurred on August 13, 2022 demonstrating a left ventricular ejection fraction of 55 to 60% with a normal global longitudinal strain.  Her biggest concern has been difficulty sleeping.  She tells me she drinks 2 glasses of caffeinated tea per day.  She denies any high sweet intake.  She does occasionally eat potato chips.  She also notes intermittent peripheral neuropathy.  She says this comes and goes and no specific pattern or duration.  Sometimes it will go away for hours sometimes it will occur on and off more frequently.  She describes this and just the pads of the tips of her fingers and as both a dysesthesia and paresthesia.  She is fatigued.  Because of her fatigue she struggles with walking distances that she previously was able to walk without difficulty.   Patient Active Problem List   Diagnosis Date Noted   Port-A-Cath in place 08/14/2022   Genetic testing 07/30/2022   Family history of breast cancer 07/18/2022   Family history of colon cancer 07/18/2022   Family history  of kidney cancer 07/18/2022   Malignant neoplasm of upper-outer quadrant of left breast in female, estrogen receptor positive (HCC) 05/22/2022   Unilateral primary osteoarthritis, left knee 10/14/2019   Vertigo 08/21/2013   Chest pain 07/02/2013   Essential hypertension 07/02/2013    Hyperlipidemia 07/02/2013    is allergic to flagyl [metronidazole].  MEDICAL HISTORY: Past Medical History:  Diagnosis Date   Breast Cancer    Left   Bursitis of hip    Esophageal reflux    Family history of breast cancer    Family history of colon cancer    Family history of kidney cancer    Fibromyalgia    Hypertension    Other and unspecified hyperlipidemia     SURGICAL HISTORY: Past Surgical History:  Procedure Laterality Date   ABDOMINAL HYSTERECTOMY     BREAST BIOPSY Left    neg bx early 2000's   BREAST BIOPSY Left 04/30/2022   [place] LT BREAST BX W LOC DEV 1ST LESION IMG BX SPEC US GUIDE 04/30/2022 [facility]   BREAST BIOPSY Left 04/30/2022   [place] LT BREAST BX W LOC DEV EA ADD LESION IMG BX SPEC US GUIDE 04/30/2022 [facility]   BREAST BIOPSY  07/17/2022   MM LT RADIOACTIVE SEED LOC MAMMO GUIDE 07/17/2022 [facility]   BREAST BIOPSY  07/17/2022   MM LT RADIOACTIVE SEED EA ADD LESION LOC MAMMO GUIDE 07/17/2022 [facility]   BREAST BIOPSY  07/17/2022   MM RT RADIOACTIVE SEED LOC MAMMO GUIDE 07/17/2022 [facility]   BREAST LUMPECTOMY WITH RADIOACTIVE SEED AND SENTINEL LYMPH NODE BIOPSY Bilateral 07/19/2022   Procedure: BILATERAL BREAST LUMPECTOMY WITH RADIOACTIVE SEED AND BILATERAL SENTINEL LYMPH NODE BIOPSY;  Surgeon: Almond Lint, MD;  Location: [facility];  Service: General;  Laterality: Bilateral;   LEFT HEART CATH  12/12/2006   PORTACATH PLACEMENT Left 07/19/2022   Procedure: INSERTION PORT-A-CATH;  Surgeon: Almond Lint, MD;  Location: [facility];  Service: General;  Laterality: Left;    SOCIAL HISTORY: Social History   Socioeconomic History   Marital status: Married    Spouse name: Not on file   Number of children: Not on file   Years of education: Not on file   Highest education level: Not on file  Occupational History   Not on file  Tobacco Use   Smoking status: Former   Smokeless tobacco: Never   Vaping Use   Vaping Use: Never used  Substance and Sexual Activity   Alcohol use: No   Drug use: No   Sexual activity: Not on file  Other Topics Concern   Not on file  Social History Narrative   Not on file   Social Determinants of Health   Financial Resource Strain: Not on file  Food Insecurity: Not on file  Transportation Needs: Not on file  Physical Activity: Not on file  Stress: Not on file  Social Connections: Not on file  Intimate Partner Violence: Not on file    FAMILY HISTORY: Family History  Problem Relation Age of Onset   Breast cancer Mother 37   Hypertension Mother    Stroke Mother    Kidney disease Father    Kidney cancer Brother 41   Colon cancer Maternal Uncle 27   Breast cancer Cousin        maternal first cousin, dx 30s    Review of Systems  Constitutional:  Positive for fatigue. Negative for appetite change, chills, fever and unexpected weight change.  HENT:  Negative for hearing loss, lump/mass, mouth sores and trouble swallowing.   Eyes:  Negative for eye problems and icterus.  Respiratory:  Negative for chest tightness, cough and shortness of breath.   Cardiovascular:  Negative for chest pain, leg swelling and palpitations.  Gastrointestinal:  Negative for abdominal distention, abdominal pain, constipation, diarrhea, nausea and vomiting.  Endocrine: Negative for hot flashes.  Genitourinary:  Negative for difficulty urinating.   Musculoskeletal:  Negative for arthralgias.  Skin:  Negative for itching and rash.  Neurological:  Positive for numbness. Negative for dizziness, extremity weakness and headaches.  Hematological:  Negative for adenopathy. Does not bruise/bleed easily.  Psychiatric/Behavioral:  Negative for depression. The patient is not nervous/anxious.       PHYSICAL EXAMINATION   Onc Performance Status - 10/15/22 1135       ECOG Perf Status   ECOG Perf Status Restricted in physically strenuous activity but ambulatory and able  to carry out work of a light or sedentary nature, e.g., light house work, office work      KPS SCALE   KPS % SCORE Normal, no compliants, no evidence of disease             Vitals:   10/15/22 1128  BP: 136/63  Pulse: 71  Resp: 20  Temp: (!) 97.3 F (36.3 C)  SpO2: 98%    Physical Exam Constitutional:      General: She is not in acute distress.    Appearance: Normal appearance. She is not toxic-appearing.  HENT:     Head: Normocephalic and atraumatic.     Mouth/Throat:     Mouth: Mucous membranes are moist.     Pharynx: Oropharynx is clear. No oropharyngeal exudate or posterior oropharyngeal erythema.  Eyes:     General: No scleral icterus. Cardiovascular:     Rate and Rhythm: Normal rate and regular rhythm.     Pulses: Normal pulses.     Heart sounds: Normal heart sounds.  Pulmonary:     Effort: Pulmonary effort is normal.     Breath sounds: Normal breath sounds.  Abdominal:     General: Abdomen is flat. Bowel sounds are normal. There is no distension.     Palpations: Abdomen is soft.     Tenderness: There is no abdominal tenderness.  Musculoskeletal:        General: No swelling.     Cervical back: Neck supple.  Lymphadenopathy:     Cervical: No cervical adenopathy.  Skin:    General: Skin is warm and dry.     Findings: No rash.  Neurological:     General: No focal deficit present.     Mental Status: She is alert.  Psychiatric:        Mood and Affect: Mood normal.        Behavior: Behavior normal.     LABORATORY DATA:  CBC    Component Value Date/Time   WBC 4.4 10/15/2022 1110   WBC 6.9 06/11/2022 0840   RBC 3.22 (L) 10/15/2022 1110   HGB 9.5 (L) 10/15/2022 1110   HCT 28.2 (L) 10/15/2022 1110   PLT 326 10/15/2022 1110   MCV 87.6 10/15/2022 1110   MCH 29.5 10/15/2022 1110   MCHC 33.7 10/15/2022 1110   RDW 17.3 (H) 10/15/2022 1110   LYMPHSABS 1.3 10/15/2022 1110   MONOABS 0.4 10/15/2022 1110   EOSABS 0.0 10/15/2022 1110   BASOSABS 0.1  10/15/2022 1110    CMP     Component Value Date/Time  NA 135 10/15/2022 1110   K 3.5 10/15/2022 1110   CL 101 10/15/2022 1110   CO2 28 10/15/2022 1110   GLUCOSE 99 10/15/2022 1110   BUN 13 10/15/2022 1110   CREATININE 1.11 (H) 10/15/2022 1110   CALCIUM 9.9 10/15/2022 1110   PROT 7.3 10/15/2022 1110   ALBUMIN 4.2 10/15/2022 1110   AST 17 10/15/2022 1110   ALT 20 10/15/2022 1110   ALKPHOS 87 10/15/2022 1110   BILITOT 0.7 10/15/2022 1110   GFRNONAA 54 (L) 10/15/2022 1110   GFRAA 81 (L) 03/23/2013 1443       ASSESSMENT and THERAPY PLAN:   Malignant neoplasm of upper-outer quadrant of left breast in female, estrogen receptor positive (HCC) Christy is a 67 year old woman with 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  She is status post bilateral lumpectomies and now proceeds with adjuvant chemotherapy with Taxol and Herceptin. ------------------------------------------------------------------------------------------------------------------------------------------------ Treatment plan: Breast conserving surgery with lymph node biopsy Adjuvant chemotherapy with Taxol Herceptin followed by Herceptin maintenance Adjuvant radiation Adjuvant antiestrogen therapy ------------------------------------------------------------------------  Current treatment: Cycle 10 Taxol Herceptin   Chemo toxicities: Fatigue: We discussed energy conservation today.  I also recommended short periods of activity to help recondition her body and also provide energy. 2.  Anemia: hemoglobin is stable at 9.5 3.  Slight creatinine increase: stable, recommended she decrease tea intake and increase water intake 4.  Alopecia 5.  Diarrhea: resolved 6.  Difficulty sleeping: suggested she decrease caffeinated tea she drinks during the day by 50%.  I recommended small activities throughout  the day to recondition and hopefully improve her circadian rhythm.   7 CIPN: Patient is enrolled in neuropathy trial: Mild neuropathy, intermittent.  Chemotherapy dose reduced to 65mg /m2 after discussion with Dr. Pamelia Hoit.   RTC in 1 week for labs, f/u, and cycle 11 of Taxol/Herceptin.     All questions were answered. The patient knows to call the clinic with any problems, questions or concerns. We can certainly see the patient much sooner if necessary.  Total encounter time:30 minutes*in face-to-face visit time, chart review, lab review, care coordination, order entry, and documentation of the encounter time.    Lillard Anes, NP 10/15/22 12:26 PM Medical Oncology and Hematology [facility] [address] [place], [name] [address] Tel. [phone]    Fax. [phone]  *Total Encounter Time as defined by the Centers for Medicare and Medicaid Services includes, in addition to the face-to-face time of a patient visit (documented in the note above) non-face-to-face time: obtaining and reviewing outside history, ordering and reviewing medications, tests or procedures, care coordination (communications with other health care professionals or caregivers) and documentation in the medical record.

## 2022-10-16 ENCOUNTER — Telehealth: Payer: Self-pay | Admitting: Radiation Oncology

## 2022-10-16 ENCOUNTER — Other Ambulatory Visit: Payer: Self-pay

## 2022-10-16 ENCOUNTER — Telehealth: Payer: Self-pay | Admitting: *Deleted

## 2022-10-16 NOTE — Telephone Encounter (Signed)
6/4 @ 1015 am patient called back left voicemail.  Called patient back no answer left voicemail for patient to call our office to be schedule for consult.

## 2022-10-16 NOTE — Telephone Encounter (Signed)
6/4 @ 8:28 am Left voicemail for patient to call our office to be schedule for consult.  Also called patient's 2nd contact number spoke to pt, but issues with patient hearing me and then cut off.  Called patient back, number unable to be complete at this time.

## 2022-10-16 NOTE — Telephone Encounter (Signed)
Z6109, ICE COMPRESS: RANDOMIZED TRIAL OF LIMB CRYOCOMPRESSION VERSUS CONTINUOUS COMPRESSION VERSUS LOW CYCLIC COMPRESSION FOR THE PREVENTION  OF TAXANE-INDUCED PERIPHERAL NEUROPATHY  This research nurse called patient to clarify neuropathy symptoms reported in provider's note during yesterday's visit. Patient did not report these symptoms to the research nurse yesterday.  Patient confirms neuropathy symptoms stating she started having tingling in her fingertips of both hands last Thursday 5/30. The tingling comes and goes and is not painful on it's own, but it does hurt patient when she tries to open a can of soda. Patient denies any neuropathy symptoms in her feet.  Thanked patient for her time.  Domenica Reamer, BSN, RN, [employer] Clinical Research Nurse II [phone] 10/16/2022 12:02 PM

## 2022-10-17 DIAGNOSIS — C50412 Malignant neoplasm of upper-outer quadrant of left female breast: Secondary | ICD-10-CM

## 2022-10-17 NOTE — Research (Signed)
Z6109, ICE COMPRESS: RANDOMIZED TRIAL OF LIMB CRYOCOMPRESSION VERSUS CONTINUOUS COMPRESSION VERSUS LOW CYCLIC COMPRESSION FOR THE PREVENTION  OF TAXANE-INDUCED PERIPHERAL NEUROPATHY  Event Grade Onset Date Resolved Date Drug Name Attribution Treatment Comments  Peripheral Sensory neuropathy Grade 1  10/11/22    Taxol Definitely Decrease Taxol dose                                                                                           Chart updated to reflect information gathered in the phone call by Estill Bakes, RN, yesterday. AE attribution by Dr Al Pimple.  Margret Chance Caden Fukushima, RN, BSN, [facility]  [employer] Clinical Research Nurse [facility] Direct Dial [phone]  Pager [phone] 10/23/2022 11:27 AM

## 2022-10-22 ENCOUNTER — Inpatient Hospital Stay: Payer: Medicare Other

## 2022-10-22 ENCOUNTER — Other Ambulatory Visit: Payer: Self-pay

## 2022-10-22 ENCOUNTER — Other Ambulatory Visit: Payer: Self-pay | Admitting: Hematology and Oncology

## 2022-10-22 VITALS — BP 126/74 | HR 65 | Temp 98.1°F | Resp 17 | Wt 241.5 lb

## 2022-10-22 DIAGNOSIS — Z5111 Encounter for antineoplastic chemotherapy: Secondary | ICD-10-CM | POA: Diagnosis not present

## 2022-10-22 DIAGNOSIS — G479 Sleep disorder, unspecified: Secondary | ICD-10-CM | POA: Diagnosis not present

## 2022-10-22 DIAGNOSIS — C50412 Malignant neoplasm of upper-outer quadrant of left female breast: Secondary | ICD-10-CM

## 2022-10-22 DIAGNOSIS — Z79633 Long term (current) use of mitotic inhibitor: Secondary | ICD-10-CM | POA: Diagnosis not present

## 2022-10-22 DIAGNOSIS — Z823 Family history of stroke: Secondary | ICD-10-CM | POA: Diagnosis not present

## 2022-10-22 DIAGNOSIS — Z8249 Family history of ischemic heart disease and other diseases of the circulatory system: Secondary | ICD-10-CM | POA: Diagnosis not present

## 2022-10-22 DIAGNOSIS — Z79899 Other long term (current) drug therapy: Secondary | ICD-10-CM | POA: Diagnosis not present

## 2022-10-22 DIAGNOSIS — Z8051 Family history of malignant neoplasm of kidney: Secondary | ICD-10-CM | POA: Diagnosis not present

## 2022-10-22 DIAGNOSIS — Z170 Estrogen receptor positive status [ER+]: Secondary | ICD-10-CM | POA: Diagnosis not present

## 2022-10-22 DIAGNOSIS — G620 Drug-induced polyneuropathy: Secondary | ICD-10-CM | POA: Diagnosis not present

## 2022-10-22 DIAGNOSIS — E876 Hypokalemia: Secondary | ICD-10-CM | POA: Diagnosis not present

## 2022-10-22 DIAGNOSIS — Z95828 Presence of other vascular implants and grafts: Secondary | ICD-10-CM

## 2022-10-22 DIAGNOSIS — Z841 Family history of disorders of kidney and ureter: Secondary | ICD-10-CM | POA: Diagnosis not present

## 2022-10-22 DIAGNOSIS — Z800 Family history of malignant neoplasm of digestive organs: Secondary | ICD-10-CM | POA: Diagnosis not present

## 2022-10-22 DIAGNOSIS — I10 Essential (primary) hypertension: Secondary | ICD-10-CM | POA: Diagnosis not present

## 2022-10-22 DIAGNOSIS — Z79620 Long term (current) use of immunosuppressive biologic: Secondary | ICD-10-CM | POA: Diagnosis not present

## 2022-10-22 DIAGNOSIS — T451X5A Adverse effect of antineoplastic and immunosuppressive drugs, initial encounter: Secondary | ICD-10-CM | POA: Diagnosis not present

## 2022-10-22 DIAGNOSIS — R051 Acute cough: Secondary | ICD-10-CM | POA: Diagnosis not present

## 2022-10-22 DIAGNOSIS — Z5112 Encounter for antineoplastic immunotherapy: Secondary | ICD-10-CM | POA: Diagnosis not present

## 2022-10-22 DIAGNOSIS — Z803 Family history of malignant neoplasm of breast: Secondary | ICD-10-CM | POA: Diagnosis not present

## 2022-10-22 DIAGNOSIS — R5383 Other fatigue: Secondary | ICD-10-CM | POA: Diagnosis not present

## 2022-10-22 LAB — CMP (CANCER CENTER ONLY)
ALT: 28 U/L (ref 0–44)
AST: 25 U/L (ref 15–41)
Albumin: 4.2 g/dL (ref 3.5–5.0)
Alkaline Phosphatase: 89 U/L (ref 38–126)
Anion gap: 8 (ref 5–15)
BUN: 14 mg/dL (ref 8–23)
CO2: 27 mmol/L (ref 22–32)
Calcium: 9.6 mg/dL (ref 8.9–10.3)
Chloride: 102 mmol/L (ref 98–111)
Creatinine: 1.19 mg/dL — ABNORMAL HIGH (ref 0.44–1.00)
GFR, Estimated: 50 mL/min — ABNORMAL LOW (ref >60)
Glucose, Bld: 108 mg/dL — ABNORMAL HIGH (ref 70–99)
Potassium: 3.5 mmol/L (ref 3.5–5.1)
Sodium: 137 mmol/L (ref 135–145)
Total Bilirubin: 0.5 mg/dL (ref 0.3–1.2)
Total Protein: 7.3 g/dL (ref 6.5–8.1)

## 2022-10-22 LAB — CBC WITH DIFFERENTIAL (CANCER CENTER ONLY)
Abs Immature Granulocytes: 0.27 10*3/uL — ABNORMAL HIGH (ref 0.00–0.07)
Basophils Absolute: 0.1 10*3/uL (ref 0.0–0.1)
Basophils Relative: 1 %
Eosinophils Absolute: 0.1 10*3/uL (ref 0.0–0.5)
Eosinophils Relative: 1 %
HCT: 27.8 % — ABNORMAL LOW (ref 36.0–46.0)
Hemoglobin: 9.2 g/dL — ABNORMAL LOW (ref 12.0–15.0)
Immature Granulocytes: 4 %
Lymphocytes Relative: 26 %
Lymphs Abs: 1.7 10*3/uL (ref 0.7–4.0)
MCH: 29.5 pg (ref 26.0–34.0)
MCHC: 33.1 g/dL (ref 30.0–36.0)
MCV: 89.1 fL (ref 80.0–100.0)
Monocytes Absolute: 0.6 10*3/uL (ref 0.1–1.0)
Monocytes Relative: 9 %
Neutro Abs: 3.7 10*3/uL (ref 1.7–7.7)
Neutrophils Relative %: 59 %
Platelet Count: 432 10*3/uL — ABNORMAL HIGH (ref 150–400)
RBC: 3.12 MIL/uL — ABNORMAL LOW (ref 3.87–5.11)
RDW: 17.7 % — ABNORMAL HIGH (ref 11.5–15.5)
WBC Count: 6.3 10*3/uL (ref 4.0–10.5)
nRBC: 0.5 % — ABNORMAL HIGH (ref 0.0–0.2)

## 2022-10-22 MED ORDER — ACETAMINOPHEN 325 MG PO TABS
650.0000 mg | ORAL_TABLET | Freq: Once | ORAL | Status: AC
  Administered 2022-10-22: 650 mg via ORAL
  Filled 2022-10-22: qty 2

## 2022-10-22 MED ORDER — SODIUM CHLORIDE 0.9 % IV SOLN: Freq: Once | INTRAVENOUS | Status: AC

## 2022-10-22 MED ORDER — TRASTUZUMAB-ANNS CHEMO 150 MG IV SOLR
2.0000 mg/kg | Freq: Once | INTRAVENOUS | Status: AC
  Administered 2022-10-22: 231 mg via INTRAVENOUS
  Filled 2022-10-22: qty 11

## 2022-10-22 MED ORDER — HEPARIN SOD (PORK) LOCK FLUSH 100 UNIT/ML IV SOLN
500.0000 [IU] | Freq: Once | INTRAVENOUS | Status: AC | PRN
  Administered 2022-10-22: 500 [IU]

## 2022-10-22 MED ORDER — SODIUM CHLORIDE 0.9% FLUSH
10.0000 mL | INTRAVENOUS | Status: DC | PRN
  Administered 2022-10-22: 10 mL

## 2022-10-22 MED ORDER — SODIUM CHLORIDE 0.9 % IV SOLN
65.0000 mg/m2 | Freq: Once | INTRAVENOUS | Status: AC
  Administered 2022-10-22: 150 mg via INTRAVENOUS
  Filled 2022-10-22: qty 25

## 2022-10-22 MED ORDER — DIPHENHYDRAMINE HCL 50 MG/ML IJ SOLN
25.0000 mg | Freq: Once | INTRAMUSCULAR | Status: AC
  Administered 2022-10-22: 25 mg via INTRAVENOUS
  Filled 2022-10-22: qty 1

## 2022-10-22 MED ORDER — FAMOTIDINE IN NACL 20-0.9 MG/50ML-% IV SOLN
20.0000 mg | Freq: Once | INTRAVENOUS | Status: AC
  Administered 2022-10-22: 20 mg via INTRAVENOUS
  Filled 2022-10-22: qty 50

## 2022-10-22 NOTE — Progress Notes (Signed)
Treatment orders signed.

## 2022-10-22 NOTE — Patient Instructions (Signed)
[facility]  Discharge Instructions: Thank you for choosing [facility] to provide your oncology and hematology care.   If you have a lab appointment with the Cancer Center, please go directly to the Cancer Center and check in at the registration area.   Wear comfortable clothing and clothing appropriate for easy access to any Portacath or PICC line.   We strive to give you quality time with your provider. You may need to reschedule your appointment if you arrive late (15 or more minutes).  Arriving late affects you and other patients whose appointments are after yours.  Also, if you miss three or more appointments without notifying the office, you may be dismissed from the clinic at the provider's discretion.      For prescription refill requests, have your pharmacy contact our office and allow 72 hours for refills to be completed.    Today you received the following chemotherapy and/or immunotherapy agents: Kanjinti, Paclitaxel.       To help prevent nausea and vomiting after your treatment, we encourage you to take your nausea medication as directed.  BELOW ARE SYMPTOMS THAT SHOULD BE REPORTED IMMEDIATELY: *FEVER GREATER THAN 100.4 F (38 C) OR HIGHER *CHILLS OR SWEATING *NAUSEA AND VOMITING THAT IS NOT CONTROLLED WITH YOUR NAUSEA MEDICATION *UNUSUAL SHORTNESS OF BREATH *UNUSUAL BRUISING OR BLEEDING *URINARY PROBLEMS (pain or burning when urinating, or frequent urination) *BOWEL PROBLEMS (unusual diarrhea, constipation, pain near the anus) TENDERNESS IN MOUTH AND THROAT WITH OR WITHOUT PRESENCE OF ULCERS (sore throat, sores in mouth, or a toothache) UNUSUAL RASH, SWELLING OR PAIN  UNUSUAL VAGINAL DISCHARGE OR ITCHING   Items with * indicate a potential emergency and should be followed up as soon as possible or go to the Emergency Department if any problems should occur.  Please show the CHEMOTHERAPY ALERT CARD or IMMUNOTHERAPY ALERT  CARD at check-in to the Emergency Department and triage nurse.  Should you have questions after your visit or need to cancel or reschedule your appointment, please contact [facility]  Dept: [phone]  and follow the prompts.  Office hours are 8:00 a.m. to 4:30 p.m. Monday - Friday. Please note that voicemails left after 4:00 p.m. may not be returned until the following business day.  We are closed weekends and major holidays. You have access to a nurse at all times for urgent questions. Please call the main number to the clinic Dept: [phone] and follow the prompts.   For any non-urgent questions, you may also contact your provider using MyChart. We now offer e-Visits for anyone 18 and older to request care online for non-urgent symptoms. For details visit [email or web address].   Also download the MyChart app! Go to the app store, search "MyChart", open the app, select [place], and log in with your MyChart username and password.   

## 2022-10-22 NOTE — Progress Notes (Signed)
New Breast Cancer Diagnosis: Bilateral Breast   Did patient present with symptoms (if so, please note symptoms) or screening mammography?:Screening Mass     Histology per Pathology Report: grade 2,3, Invasive Ductal Carcinoma with DCIS   Receptor Status: ER(positive), PR (positive), Her2-neu (positive), Ki-(60%)   Surgeon and surgical plan, if any: Dr. Donell Beers -Bilateral Breast Lumpectomy with radioactive seed and bilateral SLN biopsy. 07/19/2022   Medical oncologist, treatment if any:   Dr. Pamelia Hoit 10/15/2022 -Chemotherapy 08/14/2022- Treatment plan: Breast conserving surgery with lymph node biopsy Adjuvant chemotherapy with Taxol Herceptin followed by Herceptin maintenance Adjuvant radiation Adjuvant antiestrogen therapy  Family History of Breast/Ovarian/Prostate Cancer: Mom and maternal cousin had breast cancer.  Lymphedema issues, if any: Denies      Pain issues, if any: She reports some neuropathic pains in her hands.    SAFETY ISSUES: Prior radiation? No Pacemaker/ICD? No Possible current pregnancy? Hysterectomy Is the patient on methotrexate? No  Current Complaints / other details:   Therapist, nutritional

## 2022-10-22 NOTE — Research (Unsigned)
TRIAL S2205, ICE COMPRESS: RANDOMIZED TRIAL OF LIMB CRYOCOMPRESSION VERSUS CONTINUOUS COMPRESSION VERSUS LOW CYCLIC COMPRESSION FOR THE PREVENTION  OF TAXANE-INDUCED PERIPHERAL NEUROPATHY   Patient arrives today Accompanied by her brother  for the Week 11 visit. Confirmed no lesions or sores to extremities. Very small (1mm x 2mm) scratch on right forearm d/t scratch from dog.   1038 Wraps applied, pre-treatment started. 1150 Tolerable. 1122 Taxane started; switched to treatment phase. 1135 Tolerable. 1227 Taxane complete; switched to post-treatment phase. 1257 Treatment complete. No changes to wound; no new sores or lesions to extremities.     ADVERSE EVENTS: [name] reports ***.  ADVERSE EVENT LOGLAKELA KUBA [id number]  10/22/2022  Adverse Event Log  Event Grade Onset Date Resolved Date Drug Name Attribution Treatment Comments  Peripheral Sensory neuropathy Grade 1  10/11/22                                                                                                    EGIFT CARD: This study does not provide visit compensation.   DISPOSITION: Upon completion off all study requirements, patient remained in the infusion chair to have port de-accessed. We discussed 12 Week assessments, which will take place next week.  The patient was thanked for their time and continued voluntary participation in this study. [name] has been provided direct contact information and is encouraged to contact this Nurse for any needs or questions.

## 2022-10-23 ENCOUNTER — Encounter: Payer: Self-pay | Admitting: Hematology and Oncology

## 2022-10-23 ENCOUNTER — Encounter: Payer: Self-pay | Admitting: Radiation Oncology

## 2022-10-23 ENCOUNTER — Ambulatory Visit
Admission: RE | Admit: 2022-10-23 | Discharge: 2022-10-23 | Disposition: A | Discharge status: Home / Self Care | Payer: Medicare Other | Source: Ambulatory Visit | Attending: Radiation Oncology | Admitting: Radiation Oncology

## 2022-10-23 ENCOUNTER — Other Ambulatory Visit: Payer: Self-pay

## 2022-10-23 VITALS — BP 119/68 | HR 70 | Temp 96.8°F | Resp 20 | Ht 67.0 in | Wt 243.8 lb

## 2022-10-23 DIAGNOSIS — Z8052 Family history of malignant neoplasm of bladder: Secondary | ICD-10-CM | POA: Diagnosis not present

## 2022-10-23 DIAGNOSIS — Z87891 Personal history of nicotine dependence: Secondary | ICD-10-CM | POA: Insufficient documentation

## 2022-10-23 DIAGNOSIS — I10 Essential (primary) hypertension: Secondary | ICD-10-CM | POA: Diagnosis not present

## 2022-10-23 DIAGNOSIS — C50411 Malignant neoplasm of upper-outer quadrant of right female breast: Secondary | ICD-10-CM | POA: Diagnosis not present

## 2022-10-23 DIAGNOSIS — M797 Fibromyalgia: Secondary | ICD-10-CM | POA: Diagnosis not present

## 2022-10-23 DIAGNOSIS — C50412 Malignant neoplasm of upper-outer quadrant of left female breast: Secondary | ICD-10-CM

## 2022-10-23 DIAGNOSIS — Z803 Family history of malignant neoplasm of breast: Secondary | ICD-10-CM | POA: Insufficient documentation

## 2022-10-23 DIAGNOSIS — Z170 Estrogen receptor positive status [ER+]: Secondary | ICD-10-CM | POA: Insufficient documentation

## 2022-10-23 DIAGNOSIS — Z79899 Other long term (current) drug therapy: Secondary | ICD-10-CM | POA: Insufficient documentation

## 2022-10-23 DIAGNOSIS — Z800 Family history of malignant neoplasm of digestive organs: Secondary | ICD-10-CM | POA: Insufficient documentation

## 2022-10-23 DIAGNOSIS — Z7982 Long term (current) use of aspirin: Secondary | ICD-10-CM | POA: Insufficient documentation

## 2022-10-23 DIAGNOSIS — Z853 Personal history of malignant neoplasm of breast: Secondary | ICD-10-CM | POA: Insufficient documentation

## 2022-10-23 NOTE — Addendum Note (Signed)
Encounter addended by: Sondra Come, RN on: 10/23/2022 10:13 AM  Actions taken: Visit diagnoses modified

## 2022-10-23 NOTE — Progress Notes (Signed)
Radiation Oncology         [phone] ________________________________  Name: Christy Lynch        MRN: [id number]  Date of Service: 10/23/2022 DOB: [birth year]  WU:[id number], Angelina Pih, MD  [name], MD     REFERRING PHYSICIAN: [name], MD   DIAGNOSIS: The primary encounter diagnosis was Malignant neoplasm of upper-outer quadrant of left breast in female, estrogen receptor positive (HCC). A diagnosis of Malignant neoplasm of upper-outer quadrant of right breast in female, estrogen receptor positive (HCC) was also pertinent to this visit.   HISTORY OF PRESENT ILLNESS: Christy Lynch is a 67 y.o. female seen at the request of Dr. Pamelia Hoit for left breast cancer. She initially presented for a screening mammogram which detected a left breast abnormality. Ultrasound revealed 2 masses 0.7 cm and 0.5 cm. Biopsy revealed grade 3 IDC with DCIS ER 100%, PR 100%, Ki-67 60%, Her2 3+ positive. Patient proceeded with an MRI of the breast on 06/07/22 that revealed an additional, indeterminate clumped non-mass enhancement in the left breast and two masses on the right.   Patient underwent bilateral lumpectomies under the care of Dr. Donell Beers on 07/19/22. 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 Positive anterior margin for invasive disease, negative margins for  DCIS. All lymph nodes were negative for carcinoma. Prognostic markers: Er positive, PR positive, Her2 negative, Ki-67 5%. 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 All margins were negative. 2/2 lymph nodes were negative for carcinoma.  Prognostic marker: ER positive, PR positive, Her2 positive, Ki-67 60%. Patient tolerated the surgeries well and denied any issues from the procedure.  Dr. Pamelia Hoit recommended Taxol Herceptin followed by Herceptin maintenance. She reports to be tolerating her treatment well and  states she will finish at the end of June.  Patient is present with her supportive husband today. They live in [place], but would like to receive care at th  PREVIOUS RADIATION THERAPY: No   PAST MEDICAL HISTORY:  Past Medical History:  Diagnosis Date   Breast Cancer    Left   Bursitis of hip    Esophageal reflux    Family history of breast cancer    Family history of colon cancer    Family history of kidney cancer    Fibromyalgia    Hypertension    Other and unspecified hyperlipidemia        PAST SURGICAL HISTORY: Past Surgical History:  Procedure Laterality Date   ABDOMINAL HYSTERECTOMY     BREAST BIOPSY Left    neg bx early 2000's   BREAST BIOPSY Left 04/30/2022   [place] LT BREAST BX W LOC DEV 1ST LESION IMG BX SPEC US GUIDE 04/30/2022 [facility]   BREAST BIOPSY Left 04/30/2022   [place] LT BREAST BX W LOC DEV EA ADD LESION IMG BX SPEC US GUIDE 04/30/2022 [facility]   BREAST BIOPSY  07/17/2022   MM LT RADIOACTIVE SEED LOC MAMMO GUIDE 07/17/2022 [facility]   BREAST BIOPSY  07/17/2022   MM LT RADIOACTIVE SEED EA ADD LESION LOC MAMMO GUIDE 07/17/2022 [facility]   BREAST BIOPSY  07/17/2022   MM RT RADIOACTIVE SEED LOC MAMMO GUIDE 07/17/2022 [facility]   BREAST LUMPECTOMY WITH RADIOACTIVE SEED AND SENTINEL LYMPH NODE BIOPSY Bilateral 07/19/2022   Procedure: BILATERAL BREAST LUMPECTOMY WITH RADIOACTIVE SEED AND BILATERAL SENTINEL LYMPH NODE BIOPSY;  Surgeon: Almond Lint, MD;  Location: [facility];  Service: General;  Laterality: Bilateral;   LEFT HEART CATH  12/12/2006   PORTACATH PLACEMENT Left 07/19/2022   Procedure: INSERTION PORT-A-CATH;  Surgeon: Almond Lint, MD;  Location: [facility];  Service: General;  Laterality: Left;     FAMILY HISTORY:  Family History  Problem Relation Age of Onset   Breast cancer Mother 29   Hypertension Mother    Stroke Mother    Kidney disease Father    Kidney cancer Brother 57   Colon  cancer Maternal Uncle 15   Breast cancer Cousin        maternal first cousin, dx 30s     SOCIAL HISTORY:  reports that she has quit smoking. She has never used smokeless tobacco. She reports that she does not drink alcohol and does not use drugs.  ALLERGIES: Flagyl [metronidazole]   MEDICATIONS:  Current Outpatient Medications  Medication Sig Dispense Refill   aspirin EC 81 MG tablet Take 81 mg by mouth daily. Swallow whole.     atenolol (TENORMIN) 50 MG tablet Take 50 mg by mouth every evening.     chlorhexidine (PERIDEX) 0.12 % solution 15 mLs 2 (two) times daily.     DULoxetine (CYMBALTA) 60 MG capsule Take 60 mg by mouth every evening.     esomeprazole (NEXIUM) 40 MG capsule Take 40 mg by mouth daily at 12 noon.     lidocaine (XYLOCAINE) 2 % solution 15 mLs.     lidocaine-prilocaine (EMLA) cream Apply to affected area once 30 g 3   lisinopril-hydrochlorothiazide (ZESTORETIC) 20-25 MG tablet Take 1 tablet by mouth daily.     magic mouthwash (lidocaine, diphenhydrAMINE, alum & mag hydroxide) suspension Swish and swallow 5 mls by mouth 4 times a day as needed for mouth pain 240 mL 2   magic mouthwash w/lidocaine SOLN Take 5 mLs by mouth 4 (four) times daily as needed for mouth pain. 240 mL 2   Multiple Vitamin (MULTIVITAMIN WITH MINERALS) TABS tablet Take 1 tablet by mouth every evening.     prochlorperazine (COMPAZINE) 10 MG tablet Take 1 tablet (10 mg total) by mouth every 6 (six) hours as needed for nausea or vomiting. 30 tablet 1   No current facility-administered medications for this encounter.     REVIEW OF SYSTEMS: On review of systems, the patient reports that she is doing well overall. She denies any breast pain, lymphedema, or issues with range of motion.      PHYSICAL EXAM:  Wt Readings from Last 3 Encounters:  10/23/22 243 lb 12.8 oz (110.6 kg)  10/22/22 241 lb 8 oz (109.5 kg)  10/15/22 243 lb 2 oz (110.3 kg)   Temp Readings from Last 3 Encounters:  10/23/22 (!)  96.8 F (36 C)  10/22/22 98.1 F (36.7 C) (Oral)  10/15/22 98 F (36.7 C) (Oral)   BP Readings from Last 3 Encounters:  10/23/22 119/68  10/22/22 126/74  10/15/22 119/70   Pulse Readings from Last 3 Encounters:  10/23/22 70  10/22/22 65  10/15/22 69   Pain Assessment Pain Score: 3  Pain Loc:  (neuropathic pain.)/10  In general this is a well appearing female in no acute distress. She's alert and oriented x4 and appropriate throughout the examination. Cardiopulmonary assessment is negative for acute distress and she exhibits normal effort.     ECOG = 0  0 - Asymptomatic (Fully active, able to carry on all predisease activities without restriction)  1 - Symptomatic but  completely ambulatory (Restricted in physically strenuous activity but ambulatory and able to carry out work of a light or sedentary nature. For example, light housework, office work)  2 - Symptomatic, <50% in bed during the day (Ambulatory and capable of all self care but unable to carry out any work activities. Up and about more than 50% of waking hours)  3 - Symptomatic, >50% in bed, but not bedbound (Capable of only limited self-care, confined to bed or chair 50% or more of waking hours)  4 - Bedbound (Completely disabled. Cannot carry on any self-care. Totally confined to bed or chair)  5 - Death   Santiago Glad MM, Creech RH, Tormey DC, et al. [phone]). "Toxicity and response criteria of the [facility]". Am. Evlyn Clines. Oncol. 5 (6): 649-55    LABORATORY DATA:  Lab Results  Component Value Date   WBC 6.3 10/22/2022   HGB 9.2 (L) 10/22/2022   HCT 27.8 (L) 10/22/2022   MCV 89.1 10/22/2022   PLT 432 (H) 10/22/2022   Lab Results  Component Value Date   NA 137 10/22/2022   K 3.5 10/22/2022   CL 102 10/22/2022   CO2 27 10/22/2022   Lab Results  Component Value Date   ALT 28 10/22/2022   AST 25 10/22/2022   ALKPHOS 89 10/22/2022   BILITOT 0.5 10/22/2022      RADIOGRAPHY: No  results found.     IMPRESSION/PLAN: 1. Malignant neoplasm of upper-outer quadrant of left and right breast in female, estrogen receptor positive  Right breast: Grade 2 IDC, 1 cm with intermediate grade DCIS, ER 95%, PR 90%,, Ki-67 5%, HER2 -, 0/2 lymph nodes negative, anterior margin positive (it is skin so therefore no additional surgery is needed) Left breast: Grade 3 IDC 1.1 cm with DCIS, margins negative, 0/2 lymph nodes negative, ER 100%, PR 100%, HER2 positive 3+, Ki-67 60%  Patient underwent successful bilateral lumpectomies. She is currently undergoing chemotherapy Radiation treatment is indicated at this time to prevent risk of locoregional disease recurrence. Today we discussed the natural course of early stage breast cancer. We highlighted the role of radiotherapy in the management and focused on the details of logistics and delivery. We reviewed the anticipated acute and late sequelae associated with radiation in this setting. The patient was encouraged to ask questions that I answered to the best of my ability. Patient expressed readiness to proceed with treatment. A consent form was reviewed and signed today.    Plan:  Patient is supposed to finish chemotherapy at the end of June. We will allow for 3-4 weeks of healing prior to initiating simulation. Will plan to begin radiation approximately one week after simulation. Dr. Mitzi Hansen anticipates 4 weeks of treatment to bilateral breasts.    In a visit lasting 45 minutes, greater than 50% of the time was spent face to face discussing the patient's condition, in preparation for the discussion, and coordinating the patient's care.   The above documentation reflects my direct findings during this shared patient visit. Please see the separate note by Dr. Mitzi Hansen on this date for the remainder of the patient's plan of care.    Joyice Faster, PA-C    **Disclaimer: This note was dictated with voice recognition software. Similar sounding words can  inadvertently be transcribed and this note may contain transcription errors which may not have been corrected upon publication of note.**

## 2022-10-24 DIAGNOSIS — I10 Essential (primary) hypertension: Secondary | ICD-10-CM | POA: Diagnosis not present

## 2022-10-24 DIAGNOSIS — Z87891 Personal history of nicotine dependence: Secondary | ICD-10-CM | POA: Diagnosis not present

## 2022-10-24 DIAGNOSIS — Z299 Encounter for prophylactic measures, unspecified: Secondary | ICD-10-CM | POA: Diagnosis not present

## 2022-10-24 DIAGNOSIS — Z79899 Other long term (current) drug therapy: Secondary | ICD-10-CM | POA: Diagnosis not present

## 2022-10-24 DIAGNOSIS — Z0000 Encounter for general adult medical examination without abnormal findings: Secondary | ICD-10-CM | POA: Diagnosis not present

## 2022-10-24 DIAGNOSIS — E7800 Pure hypercholesterolemia, unspecified: Secondary | ICD-10-CM | POA: Diagnosis not present

## 2022-10-24 DIAGNOSIS — R5383 Other fatigue: Secondary | ICD-10-CM | POA: Diagnosis not present

## 2022-10-25 ENCOUNTER — Other Ambulatory Visit: Payer: Self-pay

## 2022-10-25 DIAGNOSIS — Z170 Estrogen receptor positive status [ER+]: Secondary | ICD-10-CM

## 2022-10-29 ENCOUNTER — Ambulatory Visit (HOSPITAL_COMMUNITY)
Admission: RE | Admit: 2022-10-29 | Discharge: 2022-10-29 | Disposition: A | Discharge status: Home / Self Care | Payer: Medicare Other | Source: Ambulatory Visit | Attending: Adult Health | Admitting: Adult Health

## 2022-10-29 ENCOUNTER — Inpatient Hospital Stay: Payer: Medicare Other | Admitting: Adult Health

## 2022-10-29 ENCOUNTER — Inpatient Hospital Stay: Payer: Medicare Other

## 2022-10-29 ENCOUNTER — Encounter: Payer: Self-pay | Admitting: Adult Health

## 2022-10-29 ENCOUNTER — Other Ambulatory Visit: Payer: Self-pay

## 2022-10-29 ENCOUNTER — Other Ambulatory Visit: Payer: Self-pay | Admitting: Hematology and Oncology

## 2022-10-29 VITALS — BP 133/61 | HR 81 | Temp 97.4°F | Resp 20 | Ht 67.0 in | Wt 242.5 lb

## 2022-10-29 DIAGNOSIS — Z823 Family history of stroke: Secondary | ICD-10-CM | POA: Diagnosis not present

## 2022-10-29 DIAGNOSIS — Z841 Family history of disorders of kidney and ureter: Secondary | ICD-10-CM | POA: Diagnosis not present

## 2022-10-29 DIAGNOSIS — C50412 Malignant neoplasm of upper-outer quadrant of left female breast: Secondary | ICD-10-CM | POA: Diagnosis not present

## 2022-10-29 DIAGNOSIS — Z803 Family history of malignant neoplasm of breast: Secondary | ICD-10-CM | POA: Diagnosis not present

## 2022-10-29 DIAGNOSIS — E876 Hypokalemia: Secondary | ICD-10-CM | POA: Diagnosis not present

## 2022-10-29 DIAGNOSIS — Z95828 Presence of other vascular implants and grafts: Secondary | ICD-10-CM

## 2022-10-29 DIAGNOSIS — Z8051 Family history of malignant neoplasm of kidney: Secondary | ICD-10-CM | POA: Diagnosis not present

## 2022-10-29 DIAGNOSIS — Z5112 Encounter for antineoplastic immunotherapy: Secondary | ICD-10-CM | POA: Diagnosis not present

## 2022-10-29 DIAGNOSIS — Z800 Family history of malignant neoplasm of digestive organs: Secondary | ICD-10-CM | POA: Diagnosis not present

## 2022-10-29 DIAGNOSIS — T451X5A Adverse effect of antineoplastic and immunosuppressive drugs, initial encounter: Secondary | ICD-10-CM | POA: Diagnosis not present

## 2022-10-29 DIAGNOSIS — R051 Acute cough: Secondary | ICD-10-CM

## 2022-10-29 DIAGNOSIS — Z79633 Long term (current) use of mitotic inhibitor: Secondary | ICD-10-CM | POA: Diagnosis not present

## 2022-10-29 DIAGNOSIS — Z79620 Long term (current) use of immunosuppressive biologic: Secondary | ICD-10-CM | POA: Diagnosis not present

## 2022-10-29 DIAGNOSIS — Z170 Estrogen receptor positive status [ER+]: Secondary | ICD-10-CM

## 2022-10-29 DIAGNOSIS — Z8249 Family history of ischemic heart disease and other diseases of the circulatory system: Secondary | ICD-10-CM | POA: Diagnosis not present

## 2022-10-29 DIAGNOSIS — Z79899 Other long term (current) drug therapy: Secondary | ICD-10-CM | POA: Diagnosis not present

## 2022-10-29 DIAGNOSIS — G620 Drug-induced polyneuropathy: Secondary | ICD-10-CM | POA: Diagnosis not present

## 2022-10-29 DIAGNOSIS — Z5111 Encounter for antineoplastic chemotherapy: Secondary | ICD-10-CM | POA: Diagnosis not present

## 2022-10-29 DIAGNOSIS — R5383 Other fatigue: Secondary | ICD-10-CM | POA: Diagnosis not present

## 2022-10-29 DIAGNOSIS — G479 Sleep disorder, unspecified: Secondary | ICD-10-CM | POA: Diagnosis not present

## 2022-10-29 DIAGNOSIS — I10 Essential (primary) hypertension: Secondary | ICD-10-CM | POA: Diagnosis not present

## 2022-10-29 DIAGNOSIS — R059 Cough, unspecified: Secondary | ICD-10-CM | POA: Diagnosis not present

## 2022-10-29 LAB — CBC WITH DIFFERENTIAL (CANCER CENTER ONLY)
Abs Immature Granulocytes: 0.28 10*3/uL — ABNORMAL HIGH (ref 0.00–0.07)
Basophils Absolute: 0.1 10*3/uL (ref 0.0–0.1)
Basophils Relative: 1 %
Eosinophils Absolute: 0.1 10*3/uL (ref 0.0–0.5)
Eosinophils Relative: 1 %
HCT: 26.4 % — ABNORMAL LOW (ref 36.0–46.0)
Hemoglobin: 8.7 g/dL — ABNORMAL LOW (ref 12.0–15.0)
Immature Granulocytes: 5 %
Lymphocytes Relative: 29 %
Lymphs Abs: 1.7 10*3/uL (ref 0.7–4.0)
MCH: 29.6 pg (ref 26.0–34.0)
MCHC: 33 g/dL (ref 30.0–36.0)
MCV: 89.8 fL (ref 80.0–100.0)
Monocytes Absolute: 0.5 10*3/uL (ref 0.1–1.0)
Monocytes Relative: 8 %
Neutro Abs: 3.4 10*3/uL (ref 1.7–7.7)
Neutrophils Relative %: 56 %
Platelet Count: 356 10*3/uL (ref 150–400)
RBC: 2.94 MIL/uL — ABNORMAL LOW (ref 3.87–5.11)
RDW: 17.9 % — ABNORMAL HIGH (ref 11.5–15.5)
WBC Count: 5.9 10*3/uL (ref 4.0–10.5)
nRBC: 0.7 % — ABNORMAL HIGH (ref 0.0–0.2)

## 2022-10-29 LAB — CMP (CANCER CENTER ONLY)
ALT: 24 U/L (ref 0–44)
AST: 21 U/L (ref 15–41)
Albumin: 3.8 g/dL (ref 3.5–5.0)
Alkaline Phosphatase: 88 U/L (ref 38–126)
Anion gap: 8 (ref 5–15)
BUN: 12 mg/dL (ref 8–23)
CO2: 28 mmol/L (ref 22–32)
Calcium: 9.4 mg/dL (ref 8.9–10.3)
Chloride: 101 mmol/L (ref 98–111)
Creatinine: 0.99 mg/dL (ref 0.44–1.00)
GFR, Estimated: >60 mL/min (ref >60)
Glucose, Bld: 109 mg/dL — ABNORMAL HIGH (ref 70–99)
Potassium: 3.1 mmol/L — ABNORMAL LOW (ref 3.5–5.1)
Sodium: 137 mmol/L (ref 135–145)
Total Bilirubin: 0.6 mg/dL (ref 0.3–1.2)
Total Protein: 6.4 g/dL — ABNORMAL LOW (ref 6.5–8.1)

## 2022-10-29 LAB — RESEARCH LABS

## 2022-10-29 MED ORDER — SODIUM CHLORIDE 0.9 % IV SOLN: Freq: Once | INTRAVENOUS | Status: AC

## 2022-10-29 MED ORDER — DIPHENHYDRAMINE HCL 50 MG/ML IJ SOLN
25.0000 mg | Freq: Once | INTRAMUSCULAR | Status: AC
  Administered 2022-10-29: 25 mg via INTRAVENOUS
  Filled 2022-10-29: qty 1

## 2022-10-29 MED ORDER — SODIUM CHLORIDE 0.9 % IV SOLN
65.0000 mg/m2 | Freq: Once | INTRAVENOUS | Status: AC
  Administered 2022-10-29: 150 mg via INTRAVENOUS
  Filled 2022-10-29: qty 25

## 2022-10-29 MED ORDER — SODIUM CHLORIDE 0.9% FLUSH
10.0000 mL | Freq: Once | INTRAVENOUS | Status: AC
  Administered 2022-10-29: 10 mL

## 2022-10-29 MED ORDER — POTASSIUM CHLORIDE CRYS ER 20 MEQ PO TBCR
40.0000 meq | EXTENDED_RELEASE_TABLET | Freq: Once | ORAL | Status: AC
  Administered 2022-10-29: 40 meq via ORAL
  Filled 2022-10-29: qty 2

## 2022-10-29 MED ORDER — TRASTUZUMAB-ANNS CHEMO 150 MG IV SOLR
2.0000 mg/kg | Freq: Once | INTRAVENOUS | Status: AC
  Administered 2022-10-29: 231 mg via INTRAVENOUS
  Filled 2022-10-29: qty 11

## 2022-10-29 MED ORDER — FAMOTIDINE IN NACL 20-0.9 MG/50ML-% IV SOLN
20.0000 mg | Freq: Once | INTRAVENOUS | Status: AC
  Administered 2022-10-29: 20 mg via INTRAVENOUS
  Filled 2022-10-29: qty 50

## 2022-10-29 MED ORDER — ACETAMINOPHEN 325 MG PO TABS
650.0000 mg | ORAL_TABLET | Freq: Once | ORAL | Status: AC
  Administered 2022-10-29: 650 mg via ORAL
  Filled 2022-10-29: qty 2

## 2022-10-29 MED ORDER — SODIUM CHLORIDE 0.9% FLUSH: 10.0000 mL | INTRAVENOUS | Status: DC | PRN

## 2022-10-29 MED ORDER — HEPARIN SOD (PORK) LOCK FLUSH 100 UNIT/ML IV SOLN: 500.0000 [IU] | Freq: Once | INTRAVENOUS | Status: DC | PRN

## 2022-10-29 NOTE — Patient Instructions (Signed)
Potassium Content of Foods  Potassium is a mineral found in many foods and drinks. It can affect how the heart works, affect blood pressure, and keep fluids and electrolytes balanced in the body. It is important not to have too much potassium (hyperkalemia) or too little potassium (hypokalemia) in the body, especially in the blood. Potassium is naturally found in many different types of whole foods, such as fruits, vegetables, meat, and dairy products. Processed foods tend to be lower in potassium. The amount of potassium you need each day depends on your age and any medical conditions you may have. General recommendations are: Females aged 19 and older: 2,600 mg per day. Males aged 19 and older: 3,400 mg per day. Talk with your health care provider or dietitian about how much potassium you need. What foods are high in potassium? Below are examples of foods that have greater than 200 mg of potassium per serving. Fruits Orange -- 1 medium (130 g) has 230 mg of potassium. Banana -- 1 medium (120 g) has 420 mg of potassium. Cantaloupe, chunks -- 1 cup (160 g) has 430 mg of potassium. Vegetables Potato, baked, without skin -- 1 medium (170 g) has 600 mg of potassium. Broccoli, chopped, cooked --  cup (77.5 g) has 230 mg of potassium. Tomato, chopped or sliced -- 1 cup (152 g) has 400 mg of potassium. Grains Cereal, bran with raisins -- 1 cup (59 g) has 360 mg of potassium. Granola with almonds --  cup (82 g) has 220 mg of potassium. Meats and other proteins Ground beef patty -- 4 ounces (113 g) has 240 mg of potassium. Kidney beans, boiled --  cup (130 g) has 350 mg of potassium. Almonds -- 1 ounce (approximately 22 nuts or 28 g) has 200 mg of potassium. Dairy Cow's milk, 1% -- 1 cup (237 mL) has 360 mg of potassium. Plain vanilla low-fat yogurt --  cup (184 g) has 220 mg of potassium. The items listed above may not be a complete list of foods high in potassium. Actual amounts of potassium  may be different depending on ripeness, shelf life, and food preparation. Contact a dietitian for more information. What foods are low in potassium? Below are examples of foods that have less than 200 mg of potassium per serving. Fruits Blueberries -- 1 cup (145 g) has 110 mg of potassium. Apple -- 1 medium (140 g) has 145 mg of potassium. Grapes -- 1 cup (160 g) has 175 mg of potassium. Vegetables Cabbage, raw -- 1 cup (70 g) has 120 mg of potassium. Cauliflower, chopped, cooked -- 1 cup (180 g) has 90 mg of potassium. Romaine lettuce, chopped -- 1 cup (56 g) has 120 mg of potassium. Grains Bagel, plain -- one 4-inch (10 cm) has 100 mg of potassium. Whole wheat bread -- 1 slice (26 g) has 70 mg of potassium. White rice, cooked -- 1 cup (163 g) has 50 mg of potassium. Meats and other proteins Tuna, light, canned in water -- 3 ounces (85 g) has 150 mg of potassium. Egg, fried -- 1 large (50 g) has 60 mg of potassium. Peanuts --1 ounce (35 nuts or 28 g) has 180 mg of potassium. Tofu --  cup (252 g) has 150 mg of potassium. Dairy Cheese (cheddar, colby, mozzarella, or provolone) -- 1 ounce (28 g) has 30 to 40 mg of potassium. The items listed above may not be a complete list of foods that are low in potassium. Actual amounts of   potassium may be different depending on ripeness, shelf life, and food preparation. Contact a dietitian for more information. Summary Potassium is a mineral found in many foods and drinks. It affects how the heart works, affects blood pressure, and keeps fluids and electrolytes balanced in the body. The amount of potassium you need each day depends on your age and any existing medical conditions you may have. Your health care provider or dietitian may recommend an amount of potassium that you should have each day. This information is not intended to replace advice given to you by your health care provider. Make sure you discuss any questions you have with your health  care provider. Document Revised: 01/31/2021 Document Reviewed: 01/12/2021 Elsevier Patient Education  2024 [employer] Inc.  

## 2022-10-29 NOTE — Assessment & Plan Note (Signed)
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  She is status post bilateral lumpectomies and now proceeds with adjuvant chemotherapy with Taxol and Herceptin. ------------------------------------------------------------------------------------------------------------------------------------------------ Treatment plan: Breast conserving surgery with lymph node biopsy Adjuvant chemotherapy with Taxol Herceptin followed by Herceptin maintenance Adjuvant radiation Adjuvant antiestrogen therapy ------------------------------------------------------------------------  Current treatment: Cycle 12 Taxol Herceptin   Chemo toxicities: Fatigue: Managed with energy conservation 2.  Anemia: hemoglobin is stable at 8.7 3.  Hypokalemia: She will receive potassium 40 mill equivalents oral today during treatment.  I provided potassium rich food recommendations in her after visit summary. 4.  Alopecia 5.  Diarrhea: resolved 6.  Cough: We will obtain a chest x-ray today.  Her white blood cells are normal today, this is likely a postviral cough. 7. 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  Chemotherapy dose reduced to 65mg /m2.    RTC in 1 week for labs and Herceptin alone.  She will also proceed with radiation.  I sent a scheduling message to our schedulers to make sure that more of her Herceptin treatments get scheduled.

## 2022-10-29 NOTE — Progress Notes (Signed)
[facility] Cancer Follow up:    Christy Specking, MD [address] [name] [address]   DIAGNOSIS:  Cancer Staging  Malignant neoplasm of upper-outer quadrant of left breast in female, estrogen receptor positive (HCC) Staging form: Breast, AJCC 8th Edition - Clinical: Stage IA (cT1b, cN0, cM0, G3, ER+, PR+, HER2+) - Signed by [name], MD on 05/25/2022 Stage prefix: Initial diagnosis Histologic grading system: 3 grade system   SUMMARY OF ONCOLOGIC HISTORY: Oncology History  Malignant neoplasm of upper-outer quadrant of left breast in female, estrogen receptor positive (HCC)  04/30/2022 Initial Diagnosis   Screening mammogram to treat left breast mass measuring 0.7 cm, adjacent mass 0.5 cm, biopsy of both revealed grade 3 IDC with DCIS ER 100% PR 100% Ki-67 60%, HER2 3+ positive   05/25/2022 Cancer Staging   Staging form: Breast, AJCC 8th Edition - Clinical: Stage IA (cT1b, cN0, cM0, G3, ER+, PR+, HER2+) - Signed by [name], MD on 05/25/2022 Stage prefix: Initial diagnosis Histologic grading system: 3 grade system   07/19/2022 Surgery   Right lumpectomy: Grade 2 IDC, 1 cm with intermediate grade DCIS, ER 95%, PR 90%,, Ki-67 5%, HER2 -, 0/2 lymph nodes negative, anterior margin positive Left lumpectomy: Grade 3 IDC 1.1 cm with DCIS, margins negative, 0/2 lymph nodes negative, ER 100%, PR 100%, HER2 positive 3+, Ki-67 60%   07/29/2022 Genetic Testing   Negative genetic testing on the CancerNext-Expanded+RNAinsight panel.  The report date is July 29, 2022.  The CancerNext-Expanded gene panel offered by [facility] and includes sequencing and rearrangement analysis for the following 77 genes: AIP, ALK, APC*, ATM*, AXIN2, BAP1, BARD1, BMPR1A, BRCA1*, BRCA2*, BRIP1*, CDC73, CDH1*, CDK4, CDKN1B, CDKN2A, CHEK2*, CTNNA1, DICER1, FH, FLCN, KIF1B, LZTR1, MAX, MEN1, MET, MLH1*, MSH2*, MSH3, MSH6*, MUTYH*, NF1*, NF2, NTHL1, PALB2*, PHOX2B, PMS2*, POT1, PRKAR1A, PTCH1, PTEN*,  RAD51C*, RAD51D*, RB1, RET, SDHA, SDHAF2, SDHB, SDHC, SDHD, SMAD4, SMARCA4, SMARCB1, SMARCE1, STK11, SUFU, TMEM127, TP53*, TSC1, TSC2, and VHL (sequencing and deletion/duplication); EGFR, EGLN1, HOXB13, KIT, MITF, PDGFRA, POLD1, and POLE (sequencing only); EPCAM and GREM1 (deletion/duplication only). DNA and RNA analyses performed for * genes.    08/14/2022 -  Chemotherapy   Patient is on Treatment Plan : BREAST Paclitaxel + Trastuzumab q7d / Trastuzumab q21d       CURRENT THERAPY: Taxol/herceptin  INTERVAL HISTORY: Christy Lynch 67 y.o. female returns for follow up on treatment with Taxol and Herceptin.  Today is her final Taxol treatment.  She is receiving this dose reduced because she has peripheral neuropathy in the pads of her fingers that is intermittent.  She says it occurs about 3 times a day and when it does happen it lasts for about 45 minutes.  She does not have any peripheral neuropathy currently.  She denies any motor deficits in her fingertips or toes.  She underwent radiation oncology consult with Dr. Mitzi Hansen on June 11.  She will be proceeding with radiation at the end of July.  She tells me it should last about 4 to 5 weeks.  \She also tells me that she has had a cough occasionally productive of clear sputum that has been going on for a couple of weeks.  She wants to know if it is anything to be concerned about.  She has no associated shortness of breath.   Patient Active Problem List   Diagnosis Date Noted   Chemotherapy-induced peripheral neuropathy (HCC) 10/15/2022   Port-A-Cath in place 08/14/2022   Genetic testing 07/30/2022  Family history of breast cancer 07/18/2022   Family history of colon cancer 07/18/2022   Family history of kidney cancer 07/18/2022   Malignant neoplasm of upper-outer quadrant of left breast in female, estrogen receptor positive (HCC) 05/22/2022   Unilateral primary osteoarthritis, left knee 10/14/2019   Vertigo 08/21/2013   Chest pain  07/02/2013   Essential hypertension 07/02/2013   Hyperlipidemia 07/02/2013    is allergic to flagyl [metronidazole].  MEDICAL HISTORY: Past Medical History:  Diagnosis Date   Breast Cancer    Left   Bursitis of hip    Esophageal reflux    Family history of breast cancer    Family history of colon cancer    Family history of kidney cancer    Fibromyalgia    Hypertension    Other and unspecified hyperlipidemia     SURGICAL HISTORY: Past Surgical History:  Procedure Laterality Date   ABDOMINAL HYSTERECTOMY     BREAST BIOPSY Left    neg bx early 2000's   BREAST BIOPSY Left 04/30/2022   [place] LT BREAST BX W LOC DEV 1ST LESION IMG BX SPEC US GUIDE 04/30/2022 [facility]   BREAST BIOPSY Left 04/30/2022   [place] LT BREAST BX W LOC DEV EA ADD LESION IMG BX SPEC US GUIDE 04/30/2022 [facility]   BREAST BIOPSY  07/17/2022   MM LT RADIOACTIVE SEED LOC MAMMO GUIDE 07/17/2022 [facility]   BREAST BIOPSY  07/17/2022   MM LT RADIOACTIVE SEED EA ADD LESION LOC MAMMO GUIDE 07/17/2022 [facility]   BREAST BIOPSY  07/17/2022   MM RT RADIOACTIVE SEED LOC MAMMO GUIDE 07/17/2022 [facility]   BREAST LUMPECTOMY WITH RADIOACTIVE SEED AND SENTINEL LYMPH NODE BIOPSY Bilateral 07/19/2022   Procedure: BILATERAL BREAST LUMPECTOMY WITH RADIOACTIVE SEED AND BILATERAL SENTINEL LYMPH NODE BIOPSY;  Surgeon: Almond Lint, MD;  Location: [facility];  Service: General;  Laterality: Bilateral;   LEFT HEART CATH  12/12/2006   PORTACATH PLACEMENT Left 07/19/2022   Procedure: INSERTION PORT-A-CATH;  Surgeon: Almond Lint, MD;  Location: [facility];  Service: General;  Laterality: Left;    SOCIAL HISTORY: Social History   Socioeconomic History   Marital status: Married    Spouse name: Not on file   Number of children: Not on file   Years of education: Not on file   Highest education level: Not on file  Occupational History   Not on file  Tobacco Use   Smoking  status: Former   Smokeless tobacco: Never  Vaping Use   Vaping Use: Never used  Substance and Sexual Activity   Alcohol use: No   Drug use: No   Sexual activity: Not on file  Other Topics Concern   Not on file  Social History Narrative   Not on file   Social Determinants of Health   Financial Resource Strain: Not on file  Food Insecurity: No Food Insecurity (10/23/2022)   Hunger Vital Sign    Worried About Running Out of Food in the Last Year: Never true    Ran Out of Food in the Last Year: Never true  Transportation Needs: No Transportation Needs (10/23/2022)   PRAPARE - Administrator, Civil Service (Medical): No    Lack of Transportation (Non-Medical): No  Physical Activity: Not on file  Stress: Not on file  Social Connections: Not on file  Intimate Partner Violence: Not At Risk (10/23/2022)   Humiliation, Afraid, Rape, and Kick questionnaire    Fear of Current  or Ex-Partner: No    Emotionally Abused: No    Physically Abused: No    Sexually Abused: No    FAMILY HISTORY: Family History  Problem Relation Age of Onset   Breast cancer Mother 66   Hypertension Mother    Stroke Mother    Kidney disease Father    Kidney cancer Brother 58   Colon cancer Maternal Uncle 80   Breast cancer Cousin        maternal first cousin, dx 30s    Review of Systems  Constitutional:  Negative for appetite change, chills, fatigue, fever and unexpected weight change.  HENT:   Negative for hearing loss, lump/mass and trouble swallowing.   Eyes:  Negative for eye problems and icterus.  Respiratory:  Positive for cough. Negative for chest tightness, hemoptysis, shortness of breath and wheezing.   Cardiovascular:  Negative for chest pain, leg swelling and palpitations.  Gastrointestinal:  Negative for abdominal distention, abdominal pain, constipation, diarrhea, nausea (dg chest) and vomiting.  Endocrine: Negative for hot flashes.  Genitourinary:  Negative for difficulty  urinating.   Musculoskeletal:  Negative for arthralgias.  Skin:  Negative for itching and rash.  Neurological:  Negative for dizziness, extremity weakness, headaches and numbness.  Hematological:  Negative for adenopathy. Does not bruise/bleed easily.  Psychiatric/Behavioral:  Negative for depression. The patient is not nervous/anxious.       PHYSICAL EXAMINATION    Vitals:   10/29/22 1140  BP: 133/61  Pulse: 81  Resp: 20  Temp: (!) 97.4 F (36.3 C)  SpO2: 97%    Physical Exam Constitutional:      General: She is not in acute distress.    Appearance: Normal appearance. She is not toxic-appearing.  HENT:     Head: Normocephalic and atraumatic.     Mouth/Throat:     Mouth: Mucous membranes are moist.     Pharynx: Oropharynx is clear. No oropharyngeal exudate or posterior oropharyngeal erythema.  Eyes:     General: No scleral icterus. Cardiovascular:     Rate and Rhythm: Normal rate and regular rhythm.     Pulses: Normal pulses.     Heart sounds: Normal heart sounds.  Pulmonary:     Effort: Pulmonary effort is normal.     Breath sounds: Normal breath sounds.  Abdominal:     General: Abdomen is flat. Bowel sounds are normal. There is no distension.     Palpations: Abdomen is soft.     Tenderness: There is no abdominal tenderness.  Musculoskeletal:        General: No swelling.     Cervical back: Neck supple.  Lymphadenopathy:     Cervical: No cervical adenopathy.  Skin:    General: Skin is warm and dry.     Findings: No rash.  Neurological:     General: No focal deficit present.     Mental Status: She is alert.  Psychiatric:        Mood and Affect: Mood normal.        Behavior: Behavior normal.     LABORATORY DATA:  CBC    Component Value Date/Time   WBC 5.9 10/29/2022 1113   WBC 6.9 06/11/2022 0840   RBC 2.94 (L) 10/29/2022 1113   HGB 8.7 (L) 10/29/2022 1113   HCT 26.4 (L) 10/29/2022 1113   PLT 356 10/29/2022 1113   MCV 89.8 10/29/2022 1113   MCH  29.6 10/29/2022 1113   MCHC 33.0 10/29/2022 1113   RDW 17.9 (H) 10/29/2022  1113   LYMPHSABS 1.7 10/29/2022 1113   MONOABS 0.5 10/29/2022 1113   EOSABS 0.1 10/29/2022 1113   BASOSABS 0.1 10/29/2022 1113    CMP     Component Value Date/Time   NA 137 10/29/2022 1113   K 3.1 (L) 10/29/2022 1113   CL 101 10/29/2022 1113   CO2 28 10/29/2022 1113   GLUCOSE 109 (H) 10/29/2022 1113   BUN 12 10/29/2022 1113   CREATININE 0.99 10/29/2022 1113   CALCIUM 9.4 10/29/2022 1113   PROT 6.4 (L) 10/29/2022 1113   ALBUMIN 3.8 10/29/2022 1113   AST 21 10/29/2022 1113   ALT 24 10/29/2022 1113   ALKPHOS 88 10/29/2022 1113   BILITOT 0.6 10/29/2022 1113   GFRNONAA >60 10/29/2022 1113   GFRAA 81 (L) 03/23/2013 1443        ASSESSMENT and THERAPY PLAN:   Malignant neoplasm of upper-outer quadrant of left breast in female, estrogen receptor positive (HCC) Christy is a 67 year old woman with 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  She is status post bilateral lumpectomies and now proceeds with adjuvant chemotherapy with Taxol and Herceptin. ------------------------------------------------------------------------------------------------------------------------------------------------ Treatment plan: Breast conserving surgery with lymph node biopsy Adjuvant chemotherapy with Taxol Herceptin followed by Herceptin maintenance Adjuvant radiation Adjuvant antiestrogen therapy ------------------------------------------------------------------------  Current treatment: Cycle 12 Taxol Herceptin   Chemo toxicities: Fatigue: Managed with energy conservation 2.  Anemia: hemoglobin is stable at 8.7 3.  Hypokalemia: She will receive potassium 40 mill equivalents oral today during treatment.  I provided potassium rich food recommendations in her after visit summary. 4.  Alopecia 5.   Diarrhea: resolved 6.  Cough: We will obtain a chest x-ray today.  Her white blood cells are normal today, this is likely a postviral cough. 7. 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  Chemotherapy dose reduced to 65mg /m2.    RTC in 1 week for labs and Herceptin alone.  She will also proceed with radiation.  I sent a scheduling message to our schedulers to make sure that more of her Herceptin treatments get scheduled.   All questions were answered. The patient knows to call the clinic with any problems, questions or concerns. We can certainly see the patient much sooner if necessary.  Total encounter time:30 minutes*in face-to-face visit time, chart review, lab review, care coordination, order entry, and documentation of the encounter time.    Lillard Anes, NP 10/29/22 12:28 PM Medical Oncology and Hematology [facility] [address] [place], [name] [address] Tel. [phone]    Fax. [phone]  *Total Encounter Time as defined by the Centers for Medicare and Medicaid Services includes, in addition to the face-to-face time of a patient visit (documented in the note above) non-face-to-face time: obtaining and reviewing outside history, ordering and reviewing medications, tests or procedures, care coordination (communications with other health care professionals or caregivers) and documentation in the medical record.

## 2022-10-29 NOTE — Research (Signed)
TRIAL S2205, ICE COMPRESS: RANDOMIZED TRIAL OF LIMB CRYOCOMPRESSION VERSUS CONTINUOUS COMPRESSION VERSUS LOW CYCLIC COMPRESSION FOR THE PREVENTION  OF TAXANE-INDUCED PERIPHERAL NEUROPATHY  Patient arrives today Accompanied by her husband  for the Week 12 visit. Confirmed no wounds, sores, or lesions to extremities.  1352 Wraps applied; pre-treatment started. 1410 Tolerable. 1430 Taxane started; moved to treatment phase. 1445 Tolerable. 1530 Taxane done; moved to post-treatment. 1600 Complete. Wraps removed. No wounds, sores, or lesions.   PROs: Per study protocol, all PROs required for this visit were completed prior to other study activities and completeness has been verified.     LABS: Labs are collected per consent and study protocol: [name] tolerated well without complaint.   MD/PROVIDER VISIT: Patient sees Lillard Anes, NP for today's visit.   ADVERSE EVENTS: [name] reports AE as below.  ADVERSE EVENT LOGBANDI RENSLOW [id number]  10/29/2022  Adverse Event Log  Event Grade Onset Date Resolved Date Drug Name Attribution Treatment Comments  Peripheral Sensory neuropathy Grade 1  10/11/22    Paclitaxel Definitely Decreased Taxol dose  No change                                                                                         NEURO ASSESSMENT: The neuro assessment was completed by this nurse along with Melany Guernsey, RN. [name] tolerated all testing without complaint.   GIFT CARD: This study does not provide visit compensation.   DISPOSITION: Upon completion off all study requirements, patient walked to the clinic entrance. Explained that her next study intervention will happen in 12 weeks; she verbalized understanding.   The patient was thanked for their time and continued voluntary participation in this study. [name] has been provided direct contact information and is  encouraged to contact this Nurse for any needs or questions.  Margret Chance Virgene Tirone, RN, BSN, [facility]  Her  [facility] Clinical Research Nurse [facility] Direct Dial [phone]  Pager [phone] 10/29/2022 4:20 PM

## 2022-10-29 NOTE — Patient Instructions (Signed)
[facility]  Discharge Instructions: Thank you for choosing [facility] to provide your oncology and hematology care.   If you have a lab appointment with the Cancer Center, please go directly to the Cancer Center and check in at the registration area.   Wear comfortable clothing and clothing appropriate for easy access to any Portacath or PICC line.   We strive to give you quality time with your provider. You may need to reschedule your appointment if you arrive late (15 or more minutes).  Arriving late affects you and other patients whose appointments are after yours.  Also, if you miss three or more appointments without notifying the office, you may be dismissed from the clinic at the provider's discretion.      For prescription refill requests, have your pharmacy contact our office and allow 72 hours for refills to be completed.    Today you received the following chemotherapy and/or immunotherapy agents: Trastuzumab (Kanjinti) and Paclitaxel.   To help prevent nausea and vomiting after your treatment, we encourage you to take your nausea medication as directed.  BELOW ARE SYMPTOMS THAT SHOULD BE REPORTED IMMEDIATELY: *FEVER GREATER THAN 100.4 F (38 C) OR HIGHER *CHILLS OR SWEATING *NAUSEA AND VOMITING THAT IS NOT CONTROLLED WITH YOUR NAUSEA MEDICATION *UNUSUAL SHORTNESS OF BREATH *UNUSUAL BRUISING OR BLEEDING *URINARY PROBLEMS (pain or burning when urinating, or frequent urination) *BOWEL PROBLEMS (unusual diarrhea, constipation, pain near the anus) TENDERNESS IN MOUTH AND THROAT WITH OR WITHOUT PRESENCE OF ULCERS (sore throat, sores in mouth, or a toothache) UNUSUAL RASH, SWELLING OR PAIN  UNUSUAL VAGINAL DISCHARGE OR ITCHING   Items with * indicate a potential emergency and should be followed up as soon as possible or go to the Emergency Department if any problems should occur.  Please show the CHEMOTHERAPY ALERT CARD or  IMMUNOTHERAPY ALERT CARD at check-in to the Emergency Department and triage nurse.  Should you have questions after your visit or need to cancel or reschedule your appointment, please contact [facility]  Dept: [phone]  and follow the prompts.  Office hours are 8:00 a.m. to 4:30 p.m. Monday - Friday. Please note that voicemails left after 4:00 p.m. may not be returned until the following business day.  We are closed weekends and major holidays. You have access to a nurse at all times for urgent questions. Please call the main number to the clinic Dept: [phone] and follow the prompts.   For any non-urgent questions, you may also contact your provider using MyChart. We now offer e-Visits for anyone 23 and older to request care online for non-urgent symptoms. For details visit [email or web address].   Also download the MyChart app! Go to the app store, search "MyChart", open the app, select [facility], and log in with your MyChart username and password.  Hypokalemia Hypokalemia means that the amount of potassium in the blood is lower than normal. Potassium is a mineral (electrolyte) that helps regulate the amount of fluid in the body. It also stimulates muscle tightening (contraction) and helps nerves work properly. Normally, most of the body's potassium is inside cells, and only a very small amount is in the blood. Because the amount in the blood is so small, minor changes to potassium levels in the blood can be life-threatening. What are the causes? This condition may be caused by: Antibiotic medicine. Diarrhea or vomiting. Taking too much of a medicine that helps you have a  bowel movement (laxative) can cause diarrhea and lead to hypokalemia. Chronic kidney disease (CKD). Medicines that help the body get rid of excess fluid (diuretics). Eating disorders, such as anorexia or bulimia. Low magnesium levels in the body. Sweating a lot. What are the  signs or symptoms? Symptoms of this condition include: Weakness. Constipation. Fatigue. Muscle cramps. Mental confusion. Skipped heartbeats or irregular heartbeat (palpitations). Tingling or numbness. How is this diagnosed? This condition is diagnosed with a blood test. How is this treated? This condition may be treated by: Taking potassium supplements. Adjusting the medicines that you take. Eating more foods that contain a lot of potassium. If your potassium level is very low, you may need to get potassium through an IV and be monitored in the hospital. Follow these instructions at home: Eating and drinking  Eat a healthy diet. A healthy diet includes fresh fruits and vegetables, whole grains, healthy fats, and lean proteins. If told, eat more foods that contain a lot of potassium. These include: Nuts, such as peanuts and pistachios. Seeds, such as sunflower seeds and pumpkin seeds. Peas, lentils, and lima beans. Whole grain and bran cereals and breads. Fresh fruits and vegetables, such as apricots, avocado, bananas, cantaloupe, kiwi, oranges, tomatoes, asparagus, and potatoes. Juices, such as orange, tomato, and prune. Lean meats, including fish. Milk and milk products, such as yogurt. General instructions Take over-the-counter and prescription medicines only as told by your health care provider. This includes vitamins, natural food products, and supplements. Keep all follow-up visits. This is important. Contact a health care provider if: You have weakness that gets worse. You feel your heart pounding or racing. You vomit. You have diarrhea. You have diabetes and you have trouble keeping your blood sugar in your target range. Get help right away if: You have chest pain. You have shortness of breath. You have vomiting or diarrhea that lasts for more than 2 days. You faint. These symptoms may be an emergency. Get help right away. Call 911. Do not wait to see if the  symptoms will go away. Do not drive yourself to the hospital. Summary Hypokalemia means that the amount of potassium in the blood is lower than normal. This condition is diagnosed with a blood test. Hypokalemia may be treated by taking potassium supplements, adjusting the medicines that you take, or eating more foods that are high in potassium. If your potassium level is very low, you may need to get potassium through an IV and be monitored in the hospital. This information is not intended to replace advice given to you by your health care provider. Make sure you discuss any questions you have with your health care provider. Document Revised: 01/12/2021 Document Reviewed: 01/12/2021 Elsevier Patient Education  2024 [employer].

## 2022-10-30 ENCOUNTER — Encounter: Payer: Self-pay | Admitting: *Deleted

## 2022-10-30 ENCOUNTER — Telehealth: Payer: Self-pay

## 2022-10-30 NOTE — Telephone Encounter (Signed)
-----   Message from [name], NP sent at 10/29/2022  6:01 PM EDT ----- Regarding: FW: Chest X-ray is normal.  No infection or other cause for her cough is noted.   Thanks, Mardella Layman ----- Message ----- From: Leory Plowman, Rad Results In Sent: 10/29/2022   5:27 PM EDT To: [name], NP

## 2022-10-30 NOTE — Telephone Encounter (Signed)
Pt advised with VU 

## 2022-11-01 ENCOUNTER — Telehealth: Payer: Self-pay | Admitting: Hematology and Oncology

## 2022-11-01 NOTE — Telephone Encounter (Signed)
Scheduled appointments per los and WQ. Patient is aware of the made appointments.

## 2022-11-02 ENCOUNTER — Other Ambulatory Visit: Payer: Self-pay

## 2022-11-05 ENCOUNTER — Other Ambulatory Visit: Payer: Self-pay | Admitting: *Deleted

## 2022-11-05 ENCOUNTER — Inpatient Hospital Stay: Payer: Medicare Other

## 2022-11-05 VITALS — BP 111/60 | HR 67 | Temp 97.8°F | Resp 17 | Wt 240.0 lb

## 2022-11-05 DIAGNOSIS — Z79620 Long term (current) use of immunosuppressive biologic: Secondary | ICD-10-CM | POA: Diagnosis not present

## 2022-11-05 DIAGNOSIS — Z823 Family history of stroke: Secondary | ICD-10-CM | POA: Diagnosis not present

## 2022-11-05 DIAGNOSIS — T451X5A Adverse effect of antineoplastic and immunosuppressive drugs, initial encounter: Secondary | ICD-10-CM | POA: Diagnosis not present

## 2022-11-05 DIAGNOSIS — Z5112 Encounter for antineoplastic immunotherapy: Secondary | ICD-10-CM | POA: Diagnosis not present

## 2022-11-05 DIAGNOSIS — Z95828 Presence of other vascular implants and grafts: Secondary | ICD-10-CM

## 2022-11-05 DIAGNOSIS — G479 Sleep disorder, unspecified: Secondary | ICD-10-CM | POA: Diagnosis not present

## 2022-11-05 DIAGNOSIS — C50412 Malignant neoplasm of upper-outer quadrant of left female breast: Secondary | ICD-10-CM

## 2022-11-05 DIAGNOSIS — Z803 Family history of malignant neoplasm of breast: Secondary | ICD-10-CM | POA: Diagnosis not present

## 2022-11-05 DIAGNOSIS — I10 Essential (primary) hypertension: Secondary | ICD-10-CM | POA: Diagnosis not present

## 2022-11-05 DIAGNOSIS — G620 Drug-induced polyneuropathy: Secondary | ICD-10-CM | POA: Diagnosis not present

## 2022-11-05 DIAGNOSIS — Z8051 Family history of malignant neoplasm of kidney: Secondary | ICD-10-CM | POA: Diagnosis not present

## 2022-11-05 DIAGNOSIS — E876 Hypokalemia: Secondary | ICD-10-CM | POA: Diagnosis not present

## 2022-11-05 DIAGNOSIS — R5383 Other fatigue: Secondary | ICD-10-CM | POA: Diagnosis not present

## 2022-11-05 DIAGNOSIS — Z79633 Long term (current) use of mitotic inhibitor: Secondary | ICD-10-CM | POA: Diagnosis not present

## 2022-11-05 DIAGNOSIS — Z841 Family history of disorders of kidney and ureter: Secondary | ICD-10-CM | POA: Diagnosis not present

## 2022-11-05 DIAGNOSIS — Z170 Estrogen receptor positive status [ER+]: Secondary | ICD-10-CM | POA: Diagnosis not present

## 2022-11-05 DIAGNOSIS — Z5111 Encounter for antineoplastic chemotherapy: Secondary | ICD-10-CM | POA: Diagnosis not present

## 2022-11-05 DIAGNOSIS — Z800 Family history of malignant neoplasm of digestive organs: Secondary | ICD-10-CM | POA: Diagnosis not present

## 2022-11-05 DIAGNOSIS — R051 Acute cough: Secondary | ICD-10-CM | POA: Diagnosis not present

## 2022-11-05 DIAGNOSIS — Z8249 Family history of ischemic heart disease and other diseases of the circulatory system: Secondary | ICD-10-CM | POA: Diagnosis not present

## 2022-11-05 DIAGNOSIS — Z79899 Other long term (current) drug therapy: Secondary | ICD-10-CM | POA: Diagnosis not present

## 2022-11-05 LAB — CMP (CANCER CENTER ONLY)
ALT: 23 U/L (ref 0–44)
AST: 25 U/L (ref 15–41)
Albumin: 3.8 g/dL (ref 3.5–5.0)
Alkaline Phosphatase: 86 U/L (ref 38–126)
Anion gap: 7 (ref 5–15)
BUN: 14 mg/dL (ref 8–23)
CO2: 29 mmol/L (ref 22–32)
Calcium: 9.4 mg/dL (ref 8.9–10.3)
Chloride: 100 mmol/L (ref 98–111)
Creatinine: 1.05 mg/dL — ABNORMAL HIGH (ref 0.44–1.00)
GFR, Estimated: 58 mL/min — ABNORMAL LOW (ref >60)
Glucose, Bld: 115 mg/dL — ABNORMAL HIGH (ref 70–99)
Potassium: 3.2 mmol/L — ABNORMAL LOW (ref 3.5–5.1)
Sodium: 136 mmol/L (ref 135–145)
Total Bilirubin: 0.6 mg/dL (ref 0.3–1.2)
Total Protein: 6.5 g/dL (ref 6.5–8.1)

## 2022-11-05 LAB — CBC WITH DIFFERENTIAL (CANCER CENTER ONLY)
Abs Immature Granulocytes: 0.26 10*3/uL — ABNORMAL HIGH (ref 0.00–0.07)
Basophils Absolute: 0 10*3/uL (ref 0.0–0.1)
Basophils Relative: 1 %
Eosinophils Absolute: 0 10*3/uL (ref 0.0–0.5)
Eosinophils Relative: 1 %
HCT: 26.5 % — ABNORMAL LOW (ref 36.0–46.0)
Hemoglobin: 8.6 g/dL — ABNORMAL LOW (ref 12.0–15.0)
Immature Granulocytes: 4 %
Lymphocytes Relative: 32 %
Lymphs Abs: 1.9 10*3/uL (ref 0.7–4.0)
MCH: 29.9 pg (ref 26.0–34.0)
MCHC: 32.5 g/dL (ref 30.0–36.0)
MCV: 92 fL (ref 80.0–100.0)
Monocytes Absolute: 0.4 10*3/uL (ref 0.1–1.0)
Monocytes Relative: 7 %
Neutro Abs: 3.3 10*3/uL (ref 1.7–7.7)
Neutrophils Relative %: 55 %
Platelet Count: 373 10*3/uL (ref 150–400)
RBC: 2.88 MIL/uL — ABNORMAL LOW (ref 3.87–5.11)
RDW: 18.2 % — ABNORMAL HIGH (ref 11.5–15.5)
WBC Count: 6 10*3/uL (ref 4.0–10.5)
nRBC: 1 % — ABNORMAL HIGH (ref 0.0–0.2)

## 2022-11-05 MED ORDER — SODIUM CHLORIDE 0.9 % IV SOLN: Freq: Once | INTRAVENOUS | Status: AC

## 2022-11-05 MED ORDER — DIPHENHYDRAMINE HCL 25 MG PO CAPS
50.0000 mg | ORAL_CAPSULE | Freq: Once | ORAL | Status: AC
  Administered 2022-11-05: 50 mg via ORAL
  Filled 2022-11-05: qty 2

## 2022-11-05 MED ORDER — ACETAMINOPHEN 325 MG PO TABS
650.0000 mg | ORAL_TABLET | Freq: Once | ORAL | Status: AC
  Administered 2022-11-05: 650 mg via ORAL
  Filled 2022-11-05: qty 2

## 2022-11-05 MED ORDER — SODIUM CHLORIDE 0.9% FLUSH
10.0000 mL | Freq: Once | INTRAVENOUS | Status: AC
  Administered 2022-11-05: 10 mL

## 2022-11-05 MED ORDER — SODIUM CHLORIDE 0.9% FLUSH
10.0000 mL | INTRAVENOUS | Status: DC | PRN
  Administered 2022-11-05: 10 mL

## 2022-11-05 MED ORDER — HEPARIN SOD (PORK) LOCK FLUSH 100 UNIT/ML IV SOLN
500.0000 [IU] | Freq: Once | INTRAVENOUS | Status: AC | PRN
  Administered 2022-11-05: 500 [IU]

## 2022-11-05 MED ORDER — TRASTUZUMAB-ANNS CHEMO 150 MG IV SOLR
6.0000 mg/kg | Freq: Once | INTRAVENOUS | Status: AC
  Administered 2022-11-05: 693 mg via INTRAVENOUS
  Filled 2022-11-05: qty 33

## 2022-11-05 NOTE — Patient Instructions (Signed)
[facility]  Discharge Instructions: Thank you for choosing [facility] to provide your oncology and hematology care.   If you have a lab appointment with the Cancer Center, please go directly to the Cancer Center and check in at the registration area.   Wear comfortable clothing and clothing appropriate for easy access to any Portacath or PICC line.   We strive to give you quality time with your provider. You may need to reschedule your appointment if you arrive late (15 or more minutes).  Arriving late affects you and other patients whose appointments are after yours.  Also, if you miss three or more appointments without notifying the office, you may be dismissed from the clinic at the provider's discretion.      For prescription refill requests, have your pharmacy contact our office and allow 72 hours for refills to be completed.    Today you received the following chemotherapy and/or immunotherapy agents: Kanjinti.      To help prevent nausea and vomiting after your treatment, we encourage you to take your nausea medication as directed.  BELOW ARE SYMPTOMS THAT SHOULD BE REPORTED IMMEDIATELY: *FEVER GREATER THAN 100.4 F (38 C) OR HIGHER *CHILLS OR SWEATING *NAUSEA AND VOMITING THAT IS NOT CONTROLLED WITH YOUR NAUSEA MEDICATION *UNUSUAL SHORTNESS OF BREATH *UNUSUAL BRUISING OR BLEEDING *URINARY PROBLEMS (pain or burning when urinating, or frequent urination) *BOWEL PROBLEMS (unusual diarrhea, constipation, pain near the anus) TENDERNESS IN MOUTH AND THROAT WITH OR WITHOUT PRESENCE OF ULCERS (sore throat, sores in mouth, or a toothache) UNUSUAL RASH, SWELLING OR PAIN  UNUSUAL VAGINAL DISCHARGE OR ITCHING   Items with * indicate a potential emergency and should be followed up as soon as possible or go to the Emergency Department if any problems should occur.  Please show the CHEMOTHERAPY ALERT CARD or IMMUNOTHERAPY ALERT CARD at  check-in to the Emergency Department and triage nurse.  Should you have questions after your visit or need to cancel or reschedule your appointment, please contact [facility]  Dept: [phone]  and follow the prompts.  Office hours are 8:00 a.m. to 4:30 p.m. Monday - Friday. Please note that voicemails left after 4:00 p.m. may not be returned until the following business day.  We are closed weekends and major holidays. You have access to a nurse at all times for urgent questions. Please call the main number to the clinic Dept: [phone] and follow the prompts.   For any non-urgent questions, you may also contact your provider using MyChart. We now offer e-Visits for anyone 18 and older to request care online for non-urgent symptoms. For details visit [email or web address].   Also download the MyChart app! Go to the app store, search "MyChart", open the app, select [facility], and log in with your MyChart username and password.   

## 2022-11-08 DIAGNOSIS — C50411 Malignant neoplasm of upper-outer quadrant of right female breast: Secondary | ICD-10-CM | POA: Diagnosis not present

## 2022-11-08 DIAGNOSIS — Z170 Estrogen receptor positive status [ER+]: Secondary | ICD-10-CM | POA: Diagnosis not present

## 2022-11-08 DIAGNOSIS — C50412 Malignant neoplasm of upper-outer quadrant of left female breast: Secondary | ICD-10-CM | POA: Diagnosis not present

## 2022-11-14 ENCOUNTER — Ambulatory Visit (HOSPITAL_COMMUNITY)
Admission: RE | Admit: 2022-11-14 | Discharge: 2022-11-14 | Disposition: A | Discharge status: Home / Self Care | Payer: Medicare Other | Source: Ambulatory Visit | Attending: Adult Health | Admitting: Adult Health

## 2022-11-14 DIAGNOSIS — C50412 Malignant neoplasm of upper-outer quadrant of left female breast: Secondary | ICD-10-CM | POA: Insufficient documentation

## 2022-11-14 DIAGNOSIS — Z170 Estrogen receptor positive status [ER+]: Secondary | ICD-10-CM | POA: Insufficient documentation

## 2022-11-14 DIAGNOSIS — Z0189 Encounter for other specified special examinations: Secondary | ICD-10-CM

## 2022-11-14 DIAGNOSIS — I517 Cardiomegaly: Secondary | ICD-10-CM | POA: Insufficient documentation

## 2022-11-14 NOTE — Progress Notes (Signed)
  Echocardiogram 2D Echocardiogram has been performed.  Janalyn Harder 11/14/2022, 11:55 AM

## 2022-11-15 LAB — ECHOCARDIOGRAM COMPLETE
Area-P 1/2: 3.92 cm2
Calc EF: 62.2 %
S' Lateral: 2.9 cm
Single Plane A2C EF: 60.4 %
Single Plane A4C EF: 64.2 %

## 2022-11-16 ENCOUNTER — Encounter: Payer: Self-pay | Admitting: Adult Health

## 2022-11-16 ENCOUNTER — Encounter: Payer: Self-pay | Admitting: *Deleted

## 2022-11-18 ENCOUNTER — Other Ambulatory Visit: Payer: Self-pay | Admitting: Hematology and Oncology

## 2022-11-18 DIAGNOSIS — C50412 Malignant neoplasm of upper-outer quadrant of left female breast: Secondary | ICD-10-CM

## 2022-11-19 ENCOUNTER — Encounter: Payer: Self-pay | Admitting: Hematology and Oncology

## 2022-11-24 NOTE — Progress Notes (Signed)
Patient Care Team: [name], MD as PCP - General (Internal Medicine) [name], MD as Consulting Physician (Hematology and Oncology) Almond Lint, MD as Consulting Physician (General Surgery)  DIAGNOSIS:  Encounter Diagnosis  Name Primary?   Malignant neoplasm of upper-outer quadrant of left breast in female, estrogen receptor positive (HCC) Yes    SUMMARY OF ONCOLOGIC HISTORY: Oncology History  Malignant neoplasm of upper-outer quadrant of left breast in female, estrogen receptor positive (HCC)  04/30/2022 Initial Diagnosis   Screening mammogram to treat left breast mass measuring 0.7 cm, adjacent mass 0.5 cm, biopsy of both revealed grade 3 IDC with DCIS ER 100% PR 100% Ki-67 60%, HER2 3+ positive   05/25/2022 Cancer Staging   Staging form: Breast, AJCC 8th Edition - Clinical: Stage IA (cT1b, cN0, cM0, G3, ER+, PR+, HER2+) - Signed by [name], MD on 05/25/2022 Stage prefix: Initial diagnosis Histologic grading system: 3 grade system   07/19/2022 Surgery   Right lumpectomy: Grade 2 IDC, 1 cm with intermediate grade DCIS, ER 95%, PR 90%,, Ki-67 5%, HER2 -, 0/2 lymph nodes negative, anterior margin positive Left lumpectomy: Grade 3 IDC 1.1 cm with DCIS, margins negative, 0/2 lymph nodes negative, ER 100%, PR 100%, HER2 positive 3+, Ki-67 60%   07/29/2022 Genetic Testing   Negative genetic testing on the CancerNext-Expanded+RNAinsight panel.  The report date is July 29, 2022.  The CancerNext-Expanded gene panel offered by [facility] and includes sequencing and rearrangement analysis for the following 77 genes: AIP, ALK, APC*, ATM*, AXIN2, BAP1, BARD1, BMPR1A, BRCA1*, BRCA2*, BRIP1*, CDC73, CDH1*, CDK4, CDKN1B, CDKN2A, CHEK2*, CTNNA1, DICER1, FH, FLCN, KIF1B, LZTR1, MAX, MEN1, MET, MLH1*, MSH2*, MSH3, MSH6*, MUTYH*, NF1*, NF2, NTHL1, PALB2*, PHOX2B, PMS2*, POT1, PRKAR1A, PTCH1, PTEN*, RAD51C*, RAD51D*, RB1, RET, SDHA, SDHAF2, SDHB, SDHC, SDHD, SMAD4, SMARCA4, SMARCB1,  SMARCE1, STK11, SUFU, TMEM127, TP53*, TSC1, TSC2, and VHL (sequencing and deletion/duplication); EGFR, EGLN1, HOXB13, KIT, MITF, PDGFRA, POLD1, and POLE (sequencing only); EPCAM and GREM1 (deletion/duplication only). DNA and RNA analyses performed for * genes.    08/14/2022 -  Chemotherapy   Patient is on Treatment Plan : BREAST Paclitaxel + Trastuzumab q7d / Trastuzumab q21d       CHIEF COMPLIANT:  Follow-up  Herceptin   INTERVAL HISTORY: Christy Lynch is a  67 y.o. female is here because of recent diagnosis of left breast cancer. She presents to the clinic for a follow-up. Pt complains of discoloration of the nails and skin.Mainly on the foot and arms. She does have some tingling in finger tips. Her vision does get cloudy and blurry.   ALLERGIES:  is allergic to flagyl [metronidazole].  MEDICATIONS:  Current Outpatient Medications  Medication Sig Dispense Refill   aspirin EC 81 MG tablet Take 81 mg by mouth daily. Swallow whole.     atenolol (TENORMIN) 50 MG tablet Take 50 mg by mouth every evening.     chlorhexidine (PERIDEX) 0.12 % solution 15 mLs 2 (two) times daily.     DULoxetine (CYMBALTA) 60 MG capsule Take 60 mg by mouth every evening.     esomeprazole (NEXIUM) 40 MG capsule Take 40 mg by mouth daily at 12 noon.     lidocaine (XYLOCAINE) 2 % solution 15 mLs.     lidocaine-prilocaine (EMLA) cream APPLY TO AFFECTED AREA ONCE AS DIRECTED 30 g 0   lisinopril-hydrochlorothiazide (ZESTORETIC) 20-25 MG tablet Take 1 tablet by mouth daily.     magic mouthwash (lidocaine, diphenhydrAMINE, alum & mag hydroxide) suspension Swish and swallow  5 mls by mouth 4 times a day as needed for mouth pain 240 mL 2   magic mouthwash w/lidocaine SOLN Take 5 mLs by mouth 4 (four) times daily as needed for mouth pain. 240 mL 2   Multiple Vitamin (MULTIVITAMIN WITH MINERALS) TABS tablet Take 1 tablet by mouth every evening.     prochlorperazine (COMPAZINE) 10 MG tablet Take 1 tablet (10 mg total) by  mouth every 6 (six) hours as needed for nausea or vomiting. 30 tablet 1   terbinafine (LAMISIL) 250 MG tablet Take 1 tablet (250 mg total) by mouth daily. 30 tablet 3   No current facility-administered medications for this visit.    PHYSICAL EXAMINATION: ECOG PERFORMANCE STATUS: 1 - Symptomatic but completely ambulatory  Vitals:   11/27/22 1428  BP: (!) 138/96  Pulse: 89  Resp: 18  Temp: (!) 97.3 F (36.3 C)  SpO2: 96%   Filed Weights   11/27/22 1428  Weight: 238 lb 11.2 oz (108.3 kg)      LABORATORY DATA:  I have reviewed the data as listed    Latest Ref Rng & Units 11/05/2022   12:54 PM 10/29/2022   11:13 AM 10/22/2022    8:44 AM  CMP  Glucose 70 - 99 mg/dL 284  132  440   BUN 8 - 23 mg/dL 14  12  14    Creatinine 0.44 - 1.00 mg/dL 1.02  7.25  3.66   Sodium 135 - 145 mmol/L 136  137  137   Potassium 3.5 - 5.1 mmol/L 3.2  3.1  3.5   Chloride 98 - 111 mmol/L 100  101  102   CO2 22 - 32 mmol/L 29  28  27    Calcium 8.9 - 10.3 mg/dL 9.4  9.4  9.6   Total Protein 6.5 - 8.1 g/dL 6.5  6.4  7.3   Total Bilirubin 0.3 - 1.2 mg/dL 0.6  0.6  0.5   Alkaline Phos 38 - 126 U/L 86  88  89   AST 15 - 41 U/L 25  21  25    ALT 0 - 44 U/L 23  24  28      Lab Results  Component Value Date   WBC 6.0 11/05/2022   HGB 8.6 (L) 11/05/2022   HCT 26.5 (L) 11/05/2022   MCV 92.0 11/05/2022   PLT 373 11/05/2022   NEUTROABS 3.3 11/05/2022    ASSESSMENT & PLAN:  Malignant neoplasm of upper-outer quadrant of left breast in female, 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  ------------------------------------------------------------------------------------------------------------------------------------------------ Treatment plan: 07/19/2022:Right lumpectomy: Grade 2 IDC, 1 cm with intermediate grade DCIS, ER 95%, PR 90%,, Ki-67 5%, HER2 -,  0/2 lymph nodes negative, anterior margin positive Left lumpectomy: Grade 3 IDC 1.1 cm with DCIS, margins negative, 0/2 lymph nodes negative, ER 100%, PR 100%, HER2 positive 3+, Ki-67 60% Adjuvant chemotherapy with Taxol Herceptin completed 10/29/2022 followed by Herceptin maintenance Adjuvant radiation Adjuvant antiestrogen therapy ------------------------------------------------------------------------  Current treatment: Continue with Herceptin every 3 weeks maintenance therapy  Return to clinic every 3 weeks for Herceptin every 6 weeks of follow-up with me. Radiation is likely to begin later this month. Nail bed fungus: Oral Terbinafine  RTC every 3 weeks for herceptin and every 6 weeks for labs and MD visit  No orders of the defined types were placed in this encounter.  The patient has  a good understanding of the overall plan. she agrees with it. she will call with any problems that may develop before the next visit here. Total time spent: 30 mins including face to face time and time spent for planning, charting and co-ordination of care   Tamsen Meek, MD 11/27/22    I Janan Ridge am acting as a Neurosurgeon for [employer]  I have reviewed the above documentation for accuracy and completeness, and I agree with the above.

## 2022-11-27 ENCOUNTER — Inpatient Hospital Stay: Payer: Medicare Other | Admitting: Hematology and Oncology

## 2022-11-27 ENCOUNTER — Inpatient Hospital Stay: Payer: Medicare Other | Attending: Hematology and Oncology

## 2022-11-27 ENCOUNTER — Other Ambulatory Visit: Payer: Self-pay

## 2022-11-27 VITALS — BP 138/96 | HR 89 | Temp 97.3°F | Resp 18 | Ht 67.0 in | Wt 238.7 lb

## 2022-11-27 DIAGNOSIS — C50412 Malignant neoplasm of upper-outer quadrant of left female breast: Secondary | ICD-10-CM | POA: Diagnosis not present

## 2022-11-27 DIAGNOSIS — Z79899 Other long term (current) drug therapy: Secondary | ICD-10-CM | POA: Insufficient documentation

## 2022-11-27 DIAGNOSIS — R202 Paresthesia of skin: Secondary | ICD-10-CM | POA: Diagnosis not present

## 2022-11-27 DIAGNOSIS — Z170 Estrogen receptor positive status [ER+]: Secondary | ICD-10-CM

## 2022-11-27 DIAGNOSIS — Z881 Allergy status to other antibiotic agents status: Secondary | ICD-10-CM | POA: Insufficient documentation

## 2022-11-27 DIAGNOSIS — Z79620 Long term (current) use of immunosuppressive biologic: Secondary | ICD-10-CM | POA: Diagnosis not present

## 2022-11-27 DIAGNOSIS — Z5112 Encounter for antineoplastic immunotherapy: Secondary | ICD-10-CM | POA: Insufficient documentation

## 2022-11-27 DIAGNOSIS — N6320 Unspecified lump in the left breast, unspecified quadrant: Secondary | ICD-10-CM | POA: Insufficient documentation

## 2022-11-27 MED ORDER — SODIUM CHLORIDE 0.9% FLUSH
10.0000 mL | INTRAVENOUS | Status: DC | PRN
  Administered 2022-11-27: 10 mL

## 2022-11-27 MED ORDER — TERBINAFINE HCL 250 MG PO TABS: 250.0000 mg | ORAL_TABLET | Freq: Every day | ORAL | 3 refills | Status: DC

## 2022-11-27 MED ORDER — DIPHENHYDRAMINE HCL 25 MG PO CAPS
50.0000 mg | ORAL_CAPSULE | Freq: Once | ORAL | Status: AC
  Administered 2022-11-27: 50 mg via ORAL
  Filled 2022-11-27: qty 2

## 2022-11-27 MED ORDER — SODIUM CHLORIDE 0.9 % IV SOLN: Freq: Once | INTRAVENOUS | Status: AC

## 2022-11-27 MED ORDER — ACETAMINOPHEN 325 MG PO TABS
650.0000 mg | ORAL_TABLET | Freq: Once | ORAL | Status: AC
  Administered 2022-11-27: 650 mg via ORAL
  Filled 2022-11-27: qty 2

## 2022-11-27 MED ORDER — TRASTUZUMAB-ANNS CHEMO 150 MG IV SOLR
6.0000 mg/kg | Freq: Once | INTRAVENOUS | Status: AC
  Administered 2022-11-27: 693 mg via INTRAVENOUS
  Filled 2022-11-27: qty 33

## 2022-11-27 MED ORDER — HEPARIN SOD (PORK) LOCK FLUSH 100 UNIT/ML IV SOLN
500.0000 [IU] | Freq: Once | INTRAVENOUS | Status: AC | PRN
  Administered 2022-11-27: 500 [IU]

## 2022-11-27 NOTE — Patient Instructions (Signed)
[facility]  Discharge Instructions: Thank you for choosing [facility] to provide your oncology and hematology care.   If you have a lab appointment with the Cancer Center, please go directly to the Cancer Center and check in at the registration area.   Wear comfortable clothing and clothing appropriate for easy access to any Portacath or PICC line.   We strive to give you quality time with your provider. You may need to reschedule your appointment if you arrive late (15 or more minutes).  Arriving late affects you and other patients whose appointments are after yours.  Also, if you miss three or more appointments without notifying the office, you may be dismissed from the clinic at the provider's discretion.      For prescription refill requests, have your pharmacy contact our office and allow 72 hours for refills to be completed.    Today you received the following chemotherapy and/or immunotherapy agents: Kanjinti.      To help prevent nausea and vomiting after your treatment, we encourage you to take your nausea medication as directed.  BELOW ARE SYMPTOMS THAT SHOULD BE REPORTED IMMEDIATELY: *FEVER GREATER THAN 100.4 F (38 C) OR HIGHER *CHILLS OR SWEATING *NAUSEA AND VOMITING THAT IS NOT CONTROLLED WITH YOUR NAUSEA MEDICATION *UNUSUAL SHORTNESS OF BREATH *UNUSUAL BRUISING OR BLEEDING *URINARY PROBLEMS (pain or burning when urinating, or frequent urination) *BOWEL PROBLEMS (unusual diarrhea, constipation, pain near the anus) TENDERNESS IN MOUTH AND THROAT WITH OR WITHOUT PRESENCE OF ULCERS (sore throat, sores in mouth, or a toothache) UNUSUAL RASH, SWELLING OR PAIN  UNUSUAL VAGINAL DISCHARGE OR ITCHING   Items with * indicate a potential emergency and should be followed up as soon as possible or go to the Emergency Department if any problems should occur.  Please show the CHEMOTHERAPY ALERT CARD or IMMUNOTHERAPY ALERT CARD at  check-in to the Emergency Department and triage nurse.  Should you have questions after your visit or need to cancel or reschedule your appointment, please contact [facility]  Dept: [phone]  and follow the prompts.  Office hours are 8:00 a.m. to 4:30 p.m. Monday - Friday. Please note that voicemails left after 4:00 p.m. may not be returned until the following business day.  We are closed weekends and major holidays. You have access to a nurse at all times for urgent questions. Please call the main number to the clinic Dept: [phone] and follow the prompts.   For any non-urgent questions, you may also contact your provider using MyChart. We now offer e-Visits for anyone 18 and older to request care online for non-urgent symptoms. For details visit [email or web address].   Also download the MyChart app! Go to the app store, search "MyChart", open the app, select [place], and log in with your MyChart username and password.   

## 2022-11-27 NOTE — Assessment & Plan Note (Signed)
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  ------------------------------------------------------------------------------------------------------------------------------------------------ Treatment plan: 07/19/2022:Right lumpectomy: Grade 2 IDC, 1 cm with intermediate grade DCIS, ER 95%, PR 90%,, Ki-67 5%, HER2 -, 0/2 lymph nodes negative, anterior margin positive Left lumpectomy: Grade 3 IDC 1.1 cm with DCIS, margins negative, 0/2 lymph nodes negative, ER 100%, PR 100%, HER2 positive 3+, Ki-67 60% Adjuvant chemotherapy with Taxol Herceptin completed 10/29/2022 followed by Herceptin maintenance Adjuvant radiation Adjuvant antiestrogen therapy ------------------------------------------------------------------------  Current treatment: Continue with Herceptin every 3 weeks maintenance therapy  Return to clinic every 3 weeks for Herceptin every 6 weeks of follow-up with me. Radiation is likely to begin later this month.

## 2022-11-29 ENCOUNTER — Other Ambulatory Visit: Payer: Self-pay

## 2022-12-04 ENCOUNTER — Ambulatory Visit
Admission: RE | Admit: 2022-12-04 | Discharge: 2022-12-04 | Disposition: A | Discharge status: Home / Self Care | Payer: Medicare Other | Source: Ambulatory Visit | Attending: Radiation Oncology | Admitting: Radiation Oncology

## 2022-12-04 ENCOUNTER — Other Ambulatory Visit: Payer: Self-pay

## 2022-12-04 DIAGNOSIS — C50412 Malignant neoplasm of upper-outer quadrant of left female breast: Secondary | ICD-10-CM | POA: Insufficient documentation

## 2022-12-04 DIAGNOSIS — Z170 Estrogen receptor positive status [ER+]: Secondary | ICD-10-CM | POA: Insufficient documentation

## 2022-12-04 DIAGNOSIS — C50411 Malignant neoplasm of upper-outer quadrant of right female breast: Secondary | ICD-10-CM | POA: Diagnosis not present

## 2022-12-11 ENCOUNTER — Encounter: Payer: Self-pay | Admitting: *Deleted

## 2022-12-12 ENCOUNTER — Ambulatory Visit: Payer: Medicare Other | Admitting: Radiation Oncology

## 2022-12-13 ENCOUNTER — Ambulatory Visit: Payer: Medicare Other

## 2022-12-13 ENCOUNTER — Telehealth: Payer: Self-pay | Admitting: Hematology and Oncology

## 2022-12-13 NOTE — Telephone Encounter (Signed)
Scheduled appointment per WQ.Left voicemail for patient.

## 2022-12-14 ENCOUNTER — Ambulatory Visit: Payer: Medicare Other

## 2022-12-17 ENCOUNTER — Inpatient Hospital Stay: Payer: Medicare Other

## 2022-12-17 ENCOUNTER — Other Ambulatory Visit: Payer: Self-pay

## 2022-12-17 ENCOUNTER — Ambulatory Visit: Payer: Medicare Other

## 2022-12-17 ENCOUNTER — Ambulatory Visit: Payer: Medicare Other | Admitting: Radiation Oncology

## 2022-12-17 ENCOUNTER — Ambulatory Visit
Admission: RE | Admit: 2022-12-17 | Discharge: 2022-12-17 | Disposition: A | Discharge status: Home / Self Care | Payer: Medicare Other | Source: Ambulatory Visit | Attending: Radiation Oncology | Admitting: Radiation Oncology

## 2022-12-17 VITALS — BP 110/56 | HR 67 | Temp 97.4°F | Resp 18 | Wt 238.0 lb

## 2022-12-17 DIAGNOSIS — Z79620 Long term (current) use of immunosuppressive biologic: Secondary | ICD-10-CM | POA: Insufficient documentation

## 2022-12-17 DIAGNOSIS — Z170 Estrogen receptor positive status [ER+]: Secondary | ICD-10-CM | POA: Insufficient documentation

## 2022-12-17 DIAGNOSIS — Z510 Encounter for antineoplastic radiation therapy: Secondary | ICD-10-CM | POA: Insufficient documentation

## 2022-12-17 DIAGNOSIS — C50412 Malignant neoplasm of upper-outer quadrant of left female breast: Secondary | ICD-10-CM | POA: Insufficient documentation

## 2022-12-17 DIAGNOSIS — Z881 Allergy status to other antibiotic agents status: Secondary | ICD-10-CM | POA: Insufficient documentation

## 2022-12-17 DIAGNOSIS — Z79899 Other long term (current) drug therapy: Secondary | ICD-10-CM | POA: Insufficient documentation

## 2022-12-17 DIAGNOSIS — Z5112 Encounter for antineoplastic immunotherapy: Secondary | ICD-10-CM | POA: Insufficient documentation

## 2022-12-17 DIAGNOSIS — N6320 Unspecified lump in the left breast, unspecified quadrant: Secondary | ICD-10-CM | POA: Insufficient documentation

## 2022-12-17 DIAGNOSIS — C50411 Malignant neoplasm of upper-outer quadrant of right female breast: Secondary | ICD-10-CM | POA: Diagnosis not present

## 2022-12-17 MED ORDER — SODIUM CHLORIDE 0.9 % IV SOLN: Freq: Once | INTRAVENOUS | Status: AC

## 2022-12-17 MED ORDER — ACETAMINOPHEN 325 MG PO TABS
650.0000 mg | ORAL_TABLET | Freq: Once | ORAL | Status: AC
  Administered 2022-12-17: 650 mg via ORAL
  Filled 2022-12-17: qty 2

## 2022-12-17 MED ORDER — DIPHENHYDRAMINE HCL 25 MG PO CAPS
50.0000 mg | ORAL_CAPSULE | Freq: Once | ORAL | Status: AC
  Administered 2022-12-17: 50 mg via ORAL
  Filled 2022-12-17: qty 2

## 2022-12-17 MED ORDER — SODIUM CHLORIDE 0.9% FLUSH
10.0000 mL | INTRAVENOUS | Status: DC | PRN
  Administered 2022-12-17: 10 mL

## 2022-12-17 MED ORDER — TRASTUZUMAB-ANNS CHEMO 150 MG IV SOLR
6.0000 mg/kg | Freq: Once | INTRAVENOUS | Status: AC
  Administered 2022-12-17: 693 mg via INTRAVENOUS
  Filled 2022-12-17: qty 33

## 2022-12-17 MED ORDER — HEPARIN SOD (PORK) LOCK FLUSH 100 UNIT/ML IV SOLN
500.0000 [IU] | Freq: Once | INTRAVENOUS | Status: AC | PRN
  Administered 2022-12-17: 500 [IU]

## 2022-12-17 NOTE — Patient Instructions (Addendum)
[place] [place] [facility]  Discharge Instructions: Thank you for choosing [facility] to provide your oncology and hematology care.   If you have a lab appointment with the Cancer Center, please go directly to the Cancer Center and check in at the registration area.   Wear comfortable clothing and clothing appropriate for easy access to any Portacath or PICC line.   We strive to give you quality time with your provider. You may need to reschedule your appointment if you arrive late (15 or more minutes).  Arriving late affects you and other patients whose appointments are after yours.  Also, if you miss three or more appointments without notifying the office, you may be dismissed from the clinic at the provider's discretion.      For prescription refill requests, have your pharmacy contact our office and allow 72 hours for refills to be completed.    Today you received the following chemotherapy and/or immunotherapy agents: Kanjinti.      To help prevent nausea and vomiting after your treatment, we encourage you to take your nausea medication as directed.  BELOW ARE SYMPTOMS THAT SHOULD BE REPORTED IMMEDIATELY: *FEVER GREATER THAN 100.4 F (38 C) OR HIGHER *CHILLS OR SWEATING *NAUSEA AND VOMITING THAT IS NOT CONTROLLED WITH YOUR NAUSEA MEDICATION *UNUSUAL SHORTNESS OF BREATH *UNUSUAL BRUISING OR BLEEDING *URINARY PROBLEMS (pain or burning when urinating, or frequent urination) *BOWEL PROBLEMS (unusual diarrhea, constipation, pain near the anus) TENDERNESS IN MOUTH AND THROAT WITH OR WITHOUT PRESENCE OF ULCERS (sore throat, sores in mouth, or a toothache) UNUSUAL RASH, SWELLING OR PAIN  UNUSUAL VAGINAL DISCHARGE OR ITCHING   Items with * indicate a potential emergency and should be followed up as soon as possible or go to the Emergency Department if any problems should occur.  Please show the CHEMOTHERAPY ALERT CARD or IMMUNOTHERAPY ALERT CARD at  check-in to the Emergency Department and triage nurse.  Should you have questions after your visit or need to cancel or reschedule your appointment, please contact [facility] [facility]  Dept: [phone]  and follow the prompts.  Office hours are 8:00 a.m. to 4:30 p.m. Monday - Friday. Please note that voicemails left after 4:00 p.m. may not be returned until the following business day.  We are closed weekends and major holidays. You have access to a nurse at all times for urgent questions. Please call the main number to the clinic Dept: [phone] and follow the prompts.   For any non-urgent questions, you may also contact your provider using MyChart. We now offer e-Visits for anyone 25 and older to request care online for non-urgent symptoms. For details visit [email or web address].   Also download the MyChart app! Go to the app store, search "MyChart", open the app, select [place], and log in with your MyChart username and password. [place] [facility]  Discharge Instructions: Thank you for choosing [facility] to provide your oncology and hematology care.   If you have a lab appointment with the Cancer Center, please go directly to the Cancer Center and check in at the registration area.   Wear comfortable clothing and clothing appropriate for easy access to any Portacath or PICC line.   We strive to give you quality time with your provider. You may need to reschedule your appointment if you arrive late (15 or more minutes).  Arriving late affects you and other patients whose appointments are  after yours.  Also, if you miss three or more appointments without notifying the office, you may be dismissed from the clinic at the provider's discretion.      For prescription refill requests, have your pharmacy contact our office and allow 72 hours for refills to be completed.    Today you received the following  chemotherapy and/or immunotherapy agents Kanjinti.      To help prevent nausea and vomiting after your treatment, we encourage you to take your nausea medication as directed.  BELOW ARE SYMPTOMS THAT SHOULD BE REPORTED IMMEDIATELY: *FEVER GREATER THAN 100.4 F (38 C) OR HIGHER *CHILLS OR SWEATING *NAUSEA AND VOMITING THAT IS NOT CONTROLLED WITH YOUR NAUSEA MEDICATION *UNUSUAL SHORTNESS OF BREATH *UNUSUAL BRUISING OR BLEEDING *URINARY PROBLEMS (pain or burning when urinating, or frequent urination) *BOWEL PROBLEMS (unusual diarrhea, constipation, pain near the anus) TENDERNESS IN MOUTH AND THROAT WITH OR WITHOUT PRESENCE OF ULCERS (sore throat, sores in mouth, or a toothache) UNUSUAL RASH, SWELLING OR PAIN  UNUSUAL VAGINAL DISCHARGE OR ITCHING   Items with * indicate a potential emergency and should be followed up as soon as possible or go to the Emergency Department if any problems should occur.  Please show the CHEMOTHERAPY ALERT CARD or IMMUNOTHERAPY ALERT CARD at check-in to the Emergency Department and triage nurse.  Should you have questions after your visit or need to cancel or reschedule your appointment, please contact [facility] [facility]  Dept: [phone]  and follow the prompts.  Office hours are 8:00 a.m. to 4:30 p.m. Monday - Friday. Please note that voicemails left after 4:00 p.m. may not be returned until the following business day.  We are closed weekends and major holidays. You have access to a nurse at all times for urgent questions. Please call the main number to the clinic Dept: [phone] and follow the prompts.   For any non-urgent questions, you may also contact your provider using MyChart. We now offer e-Visits for anyone 71 and older to request care online for non-urgent symptoms. For details visit [email or web address].   Also download the MyChart app! Go to the app store, search "MyChart", open the app, select [place], and log  in with your MyChart username and password.

## 2022-12-18 ENCOUNTER — Other Ambulatory Visit: Payer: Self-pay

## 2022-12-18 ENCOUNTER — Ambulatory Visit: Payer: Medicare Other

## 2022-12-18 DIAGNOSIS — C50412 Malignant neoplasm of upper-outer quadrant of left female breast: Secondary | ICD-10-CM | POA: Diagnosis not present

## 2022-12-18 DIAGNOSIS — Z170 Estrogen receptor positive status [ER+]: Secondary | ICD-10-CM | POA: Diagnosis not present

## 2022-12-18 DIAGNOSIS — C50411 Malignant neoplasm of upper-outer quadrant of right female breast: Secondary | ICD-10-CM | POA: Diagnosis not present

## 2022-12-18 DIAGNOSIS — Z510 Encounter for antineoplastic radiation therapy: Secondary | ICD-10-CM | POA: Diagnosis not present

## 2022-12-18 DIAGNOSIS — Z5112 Encounter for antineoplastic immunotherapy: Secondary | ICD-10-CM | POA: Diagnosis not present

## 2022-12-18 LAB — RAD ONC ARIA SESSION SUMMARY
Course Elapsed Days: 0
Plan Fractions Treated to Date: 1
Plan Fractions Treated to Date: 1
Plan Prescribed Dose Per Fraction: 2.66 Gy
Plan Prescribed Dose Per Fraction: 2.66 Gy
Plan Total Fractions Prescribed: 16
Plan Total Fractions Prescribed: 16
Plan Total Prescribed Dose: 42.56 Gy
Plan Total Prescribed Dose: 42.56 Gy
Reference Point Dosage Given to Date: 2.66 Gy
Reference Point Dosage Given to Date: 2.66 Gy
Reference Point Session Dosage Given: 2.66 Gy
Reference Point Session Dosage Given: 2.66 Gy
Session Number: 1

## 2022-12-19 ENCOUNTER — Other Ambulatory Visit: Payer: Self-pay

## 2022-12-19 ENCOUNTER — Ambulatory Visit: Admission: RE | Admit: 2022-12-19 | Payer: Medicare Other | Source: Ambulatory Visit

## 2022-12-19 DIAGNOSIS — Z5112 Encounter for antineoplastic immunotherapy: Secondary | ICD-10-CM | POA: Diagnosis not present

## 2022-12-19 DIAGNOSIS — Z510 Encounter for antineoplastic radiation therapy: Secondary | ICD-10-CM | POA: Diagnosis not present

## 2022-12-19 DIAGNOSIS — Z170 Estrogen receptor positive status [ER+]: Secondary | ICD-10-CM | POA: Diagnosis not present

## 2022-12-19 DIAGNOSIS — C50412 Malignant neoplasm of upper-outer quadrant of left female breast: Secondary | ICD-10-CM | POA: Diagnosis not present

## 2022-12-19 DIAGNOSIS — C50411 Malignant neoplasm of upper-outer quadrant of right female breast: Secondary | ICD-10-CM | POA: Diagnosis not present

## 2022-12-19 LAB — RAD ONC ARIA SESSION SUMMARY
Course Elapsed Days: 1
Plan Fractions Treated to Date: 2
Plan Fractions Treated to Date: 2
Plan Prescribed Dose Per Fraction: 2.66 Gy
Plan Prescribed Dose Per Fraction: 2.66 Gy
Plan Total Fractions Prescribed: 16
Plan Total Fractions Prescribed: 16
Plan Total Prescribed Dose: 42.56 Gy
Plan Total Prescribed Dose: 42.56 Gy
Reference Point Dosage Given to Date: 5.32 Gy
Reference Point Dosage Given to Date: 5.32 Gy
Reference Point Session Dosage Given: 2.66 Gy
Reference Point Session Dosage Given: 2.66 Gy
Session Number: 2

## 2022-12-20 ENCOUNTER — Ambulatory Visit: Payer: Medicare Other

## 2022-12-21 ENCOUNTER — Other Ambulatory Visit: Payer: Self-pay

## 2022-12-21 ENCOUNTER — Ambulatory Visit: Admission: RE | Admit: 2022-12-21 | Payer: Medicare Other | Source: Ambulatory Visit

## 2022-12-21 ENCOUNTER — Ambulatory Visit
Admission: RE | Admit: 2022-12-21 | Discharge: 2022-12-21 | Disposition: A | Discharge status: Home / Self Care | Payer: Medicare Other | Source: Ambulatory Visit | Attending: Radiation Oncology | Admitting: Radiation Oncology

## 2022-12-21 DIAGNOSIS — Z5112 Encounter for antineoplastic immunotherapy: Secondary | ICD-10-CM | POA: Diagnosis not present

## 2022-12-21 DIAGNOSIS — Z170 Estrogen receptor positive status [ER+]: Secondary | ICD-10-CM

## 2022-12-21 DIAGNOSIS — Z510 Encounter for antineoplastic radiation therapy: Secondary | ICD-10-CM | POA: Diagnosis not present

## 2022-12-21 DIAGNOSIS — C50411 Malignant neoplasm of upper-outer quadrant of right female breast: Secondary | ICD-10-CM | POA: Diagnosis not present

## 2022-12-21 DIAGNOSIS — C50412 Malignant neoplasm of upper-outer quadrant of left female breast: Secondary | ICD-10-CM | POA: Diagnosis not present

## 2022-12-21 LAB — RAD ONC ARIA SESSION SUMMARY
Course Elapsed Days: 3
Plan Fractions Treated to Date: 3
Plan Fractions Treated to Date: 3
Plan Prescribed Dose Per Fraction: 2.66 Gy
Plan Prescribed Dose Per Fraction: 2.66 Gy
Plan Total Fractions Prescribed: 16
Plan Total Fractions Prescribed: 16
Plan Total Prescribed Dose: 42.56 Gy
Plan Total Prescribed Dose: 42.56 Gy
Reference Point Dosage Given to Date: 7.98 Gy
Reference Point Dosage Given to Date: 7.98 Gy
Reference Point Session Dosage Given: 2.66 Gy
Reference Point Session Dosage Given: 2.66 Gy
Session Number: 3

## 2022-12-21 MED ORDER — RADIAPLEXRX EX GEL: Freq: Once | CUTANEOUS | Status: AC

## 2022-12-21 MED ORDER — ALRA NON-METALLIC DEODORANT (RAD-ONC)
1.0000 | Freq: Once | TOPICAL | Status: AC
  Administered 2022-12-21: 1 via TOPICAL

## 2022-12-24 ENCOUNTER — Other Ambulatory Visit: Payer: Self-pay

## 2022-12-24 ENCOUNTER — Ambulatory Visit
Admission: RE | Admit: 2022-12-24 | Discharge: 2022-12-24 | Disposition: A | Discharge status: Home / Self Care | Payer: Medicare Other | Source: Ambulatory Visit | Attending: Radiation Oncology | Admitting: Radiation Oncology

## 2022-12-24 DIAGNOSIS — C50412 Malignant neoplasm of upper-outer quadrant of left female breast: Secondary | ICD-10-CM | POA: Diagnosis not present

## 2022-12-24 DIAGNOSIS — C50411 Malignant neoplasm of upper-outer quadrant of right female breast: Secondary | ICD-10-CM | POA: Diagnosis not present

## 2022-12-24 DIAGNOSIS — Z510 Encounter for antineoplastic radiation therapy: Secondary | ICD-10-CM | POA: Diagnosis not present

## 2022-12-24 DIAGNOSIS — Z5112 Encounter for antineoplastic immunotherapy: Secondary | ICD-10-CM | POA: Diagnosis not present

## 2022-12-24 DIAGNOSIS — Z170 Estrogen receptor positive status [ER+]: Secondary | ICD-10-CM | POA: Diagnosis not present

## 2022-12-24 LAB — RAD ONC ARIA SESSION SUMMARY
Course Elapsed Days: 6
Plan Fractions Treated to Date: 4
Plan Fractions Treated to Date: 4
Plan Prescribed Dose Per Fraction: 2.66 Gy
Plan Prescribed Dose Per Fraction: 2.66 Gy
Plan Total Fractions Prescribed: 16
Plan Total Fractions Prescribed: 16
Plan Total Prescribed Dose: 42.56 Gy
Plan Total Prescribed Dose: 42.56 Gy
Reference Point Dosage Given to Date: 10.64 Gy
Reference Point Dosage Given to Date: 10.64 Gy
Reference Point Session Dosage Given: 2.66 Gy
Reference Point Session Dosage Given: 2.66 Gy
Session Number: 4

## 2022-12-25 ENCOUNTER — Other Ambulatory Visit: Payer: Self-pay

## 2022-12-25 ENCOUNTER — Ambulatory Visit
Admission: RE | Admit: 2022-12-25 | Discharge: 2022-12-25 | Disposition: A | Discharge status: Home / Self Care | Payer: Medicare Other | Source: Ambulatory Visit | Attending: Radiation Oncology | Admitting: Radiation Oncology

## 2022-12-25 DIAGNOSIS — C50411 Malignant neoplasm of upper-outer quadrant of right female breast: Secondary | ICD-10-CM | POA: Diagnosis not present

## 2022-12-25 DIAGNOSIS — Z510 Encounter for antineoplastic radiation therapy: Secondary | ICD-10-CM | POA: Diagnosis not present

## 2022-12-25 DIAGNOSIS — Z5112 Encounter for antineoplastic immunotherapy: Secondary | ICD-10-CM | POA: Diagnosis not present

## 2022-12-25 DIAGNOSIS — C50412 Malignant neoplasm of upper-outer quadrant of left female breast: Secondary | ICD-10-CM | POA: Diagnosis not present

## 2022-12-25 DIAGNOSIS — Z170 Estrogen receptor positive status [ER+]: Secondary | ICD-10-CM | POA: Diagnosis not present

## 2022-12-25 LAB — RAD ONC ARIA SESSION SUMMARY
Course Elapsed Days: 7
Plan Fractions Treated to Date: 5
Plan Fractions Treated to Date: 5
Plan Prescribed Dose Per Fraction: 2.66 Gy
Plan Prescribed Dose Per Fraction: 2.66 Gy
Plan Total Fractions Prescribed: 16
Plan Total Fractions Prescribed: 16
Plan Total Prescribed Dose: 42.56 Gy
Plan Total Prescribed Dose: 42.56 Gy
Reference Point Dosage Given to Date: 13.3 Gy
Reference Point Dosage Given to Date: 13.3 Gy
Reference Point Session Dosage Given: 2.66 Gy
Reference Point Session Dosage Given: 2.66 Gy
Session Number: 5

## 2022-12-26 ENCOUNTER — Other Ambulatory Visit: Payer: Self-pay

## 2022-12-26 ENCOUNTER — Ambulatory Visit
Admission: RE | Admit: 2022-12-26 | Discharge: 2022-12-26 | Disposition: A | Discharge status: Home / Self Care | Payer: Medicare Other | Source: Ambulatory Visit | Attending: Radiation Oncology | Admitting: Radiation Oncology

## 2022-12-26 DIAGNOSIS — Z510 Encounter for antineoplastic radiation therapy: Secondary | ICD-10-CM | POA: Diagnosis not present

## 2022-12-26 DIAGNOSIS — Z170 Estrogen receptor positive status [ER+]: Secondary | ICD-10-CM | POA: Diagnosis not present

## 2022-12-26 DIAGNOSIS — C50412 Malignant neoplasm of upper-outer quadrant of left female breast: Secondary | ICD-10-CM | POA: Diagnosis not present

## 2022-12-26 DIAGNOSIS — Z5112 Encounter for antineoplastic immunotherapy: Secondary | ICD-10-CM | POA: Diagnosis not present

## 2022-12-26 DIAGNOSIS — C50411 Malignant neoplasm of upper-outer quadrant of right female breast: Secondary | ICD-10-CM | POA: Diagnosis not present

## 2022-12-26 LAB — RAD ONC ARIA SESSION SUMMARY
Course Elapsed Days: 8
Plan Fractions Treated to Date: 6
Plan Fractions Treated to Date: 6
Plan Prescribed Dose Per Fraction: 2.66 Gy
Plan Prescribed Dose Per Fraction: 2.66 Gy
Plan Total Fractions Prescribed: 16
Plan Total Fractions Prescribed: 16
Plan Total Prescribed Dose: 42.56 Gy
Plan Total Prescribed Dose: 42.56 Gy
Reference Point Dosage Given to Date: 15.96 Gy
Reference Point Dosage Given to Date: 15.96 Gy
Reference Point Session Dosage Given: 2.66 Gy
Reference Point Session Dosage Given: 2.66 Gy
Session Number: 6

## 2022-12-27 ENCOUNTER — Other Ambulatory Visit: Payer: Self-pay

## 2022-12-27 ENCOUNTER — Ambulatory Visit
Admission: RE | Admit: 2022-12-27 | Discharge: 2022-12-27 | Disposition: A | Discharge status: Home / Self Care | Payer: Medicare Other | Source: Ambulatory Visit | Attending: Radiation Oncology | Admitting: Radiation Oncology

## 2022-12-27 DIAGNOSIS — C50411 Malignant neoplasm of upper-outer quadrant of right female breast: Secondary | ICD-10-CM | POA: Diagnosis not present

## 2022-12-27 DIAGNOSIS — Z510 Encounter for antineoplastic radiation therapy: Secondary | ICD-10-CM | POA: Diagnosis not present

## 2022-12-27 DIAGNOSIS — Z170 Estrogen receptor positive status [ER+]: Secondary | ICD-10-CM | POA: Diagnosis not present

## 2022-12-27 DIAGNOSIS — Z5112 Encounter for antineoplastic immunotherapy: Secondary | ICD-10-CM | POA: Diagnosis not present

## 2022-12-27 DIAGNOSIS — C50412 Malignant neoplasm of upper-outer quadrant of left female breast: Secondary | ICD-10-CM | POA: Diagnosis not present

## 2022-12-27 LAB — RAD ONC ARIA SESSION SUMMARY
Course Elapsed Days: 9
Plan Fractions Treated to Date: 7
Plan Fractions Treated to Date: 7
Plan Prescribed Dose Per Fraction: 2.66 Gy
Plan Prescribed Dose Per Fraction: 2.66 Gy
Plan Total Fractions Prescribed: 16
Plan Total Fractions Prescribed: 16
Plan Total Prescribed Dose: 42.56 Gy
Plan Total Prescribed Dose: 42.56 Gy
Reference Point Dosage Given to Date: 18.62 Gy
Reference Point Dosage Given to Date: 18.62 Gy
Reference Point Session Dosage Given: 2.66 Gy
Reference Point Session Dosage Given: 2.66 Gy
Session Number: 7

## 2022-12-28 ENCOUNTER — Other Ambulatory Visit: Payer: Self-pay

## 2022-12-28 ENCOUNTER — Ambulatory Visit
Admission: RE | Admit: 2022-12-28 | Discharge: 2022-12-28 | Disposition: A | Discharge status: Home / Self Care | Payer: Medicare Other | Source: Ambulatory Visit | Attending: Radiation Oncology | Admitting: Radiation Oncology

## 2022-12-28 ENCOUNTER — Ambulatory Visit: Payer: Medicare Other | Admitting: Radiation Oncology

## 2022-12-28 DIAGNOSIS — Z5112 Encounter for antineoplastic immunotherapy: Secondary | ICD-10-CM | POA: Diagnosis not present

## 2022-12-28 DIAGNOSIS — Z510 Encounter for antineoplastic radiation therapy: Secondary | ICD-10-CM | POA: Diagnosis not present

## 2022-12-28 DIAGNOSIS — Z170 Estrogen receptor positive status [ER+]: Secondary | ICD-10-CM | POA: Diagnosis not present

## 2022-12-28 DIAGNOSIS — C50411 Malignant neoplasm of upper-outer quadrant of right female breast: Secondary | ICD-10-CM | POA: Diagnosis not present

## 2022-12-28 DIAGNOSIS — C50412 Malignant neoplasm of upper-outer quadrant of left female breast: Secondary | ICD-10-CM | POA: Diagnosis not present

## 2022-12-28 LAB — RAD ONC ARIA SESSION SUMMARY
Course Elapsed Days: 10
Plan Fractions Treated to Date: 8
Plan Fractions Treated to Date: 8
Plan Prescribed Dose Per Fraction: 2.66 Gy
Plan Prescribed Dose Per Fraction: 2.66 Gy
Plan Total Fractions Prescribed: 16
Plan Total Fractions Prescribed: 16
Plan Total Prescribed Dose: 42.56 Gy
Plan Total Prescribed Dose: 42.56 Gy
Reference Point Dosage Given to Date: 21.28 Gy
Reference Point Dosage Given to Date: 21.28 Gy
Reference Point Session Dosage Given: 2.66 Gy
Reference Point Session Dosage Given: 2.66 Gy
Session Number: 8

## 2022-12-31 ENCOUNTER — Other Ambulatory Visit: Payer: Self-pay

## 2022-12-31 ENCOUNTER — Ambulatory Visit: Payer: Medicare Other

## 2022-12-31 DIAGNOSIS — C50412 Malignant neoplasm of upper-outer quadrant of left female breast: Secondary | ICD-10-CM | POA: Diagnosis not present

## 2022-12-31 DIAGNOSIS — Z5112 Encounter for antineoplastic immunotherapy: Secondary | ICD-10-CM | POA: Diagnosis not present

## 2022-12-31 DIAGNOSIS — Z170 Estrogen receptor positive status [ER+]: Secondary | ICD-10-CM | POA: Diagnosis not present

## 2022-12-31 DIAGNOSIS — C50411 Malignant neoplasm of upper-outer quadrant of right female breast: Secondary | ICD-10-CM | POA: Diagnosis not present

## 2022-12-31 DIAGNOSIS — Z510 Encounter for antineoplastic radiation therapy: Secondary | ICD-10-CM | POA: Diagnosis not present

## 2022-12-31 LAB — RAD ONC ARIA SESSION SUMMARY
Course Elapsed Days: 13
Plan Fractions Treated to Date: 9
Plan Fractions Treated to Date: 9
Plan Prescribed Dose Per Fraction: 2.66 Gy
Plan Prescribed Dose Per Fraction: 2.66 Gy
Plan Total Fractions Prescribed: 16
Plan Total Fractions Prescribed: 16
Plan Total Prescribed Dose: 42.56 Gy
Plan Total Prescribed Dose: 42.56 Gy
Reference Point Dosage Given to Date: 23.94 Gy
Reference Point Dosage Given to Date: 23.94 Gy
Reference Point Session Dosage Given: 2.66 Gy
Reference Point Session Dosage Given: 2.66 Gy
Session Number: 9

## 2023-01-01 ENCOUNTER — Ambulatory Visit: Payer: Medicare Other

## 2023-01-01 ENCOUNTER — Other Ambulatory Visit: Payer: Self-pay

## 2023-01-01 DIAGNOSIS — C50412 Malignant neoplasm of upper-outer quadrant of left female breast: Secondary | ICD-10-CM | POA: Diagnosis not present

## 2023-01-01 DIAGNOSIS — Z5112 Encounter for antineoplastic immunotherapy: Secondary | ICD-10-CM | POA: Diagnosis not present

## 2023-01-01 DIAGNOSIS — C50411 Malignant neoplasm of upper-outer quadrant of right female breast: Secondary | ICD-10-CM | POA: Diagnosis not present

## 2023-01-01 DIAGNOSIS — Z510 Encounter for antineoplastic radiation therapy: Secondary | ICD-10-CM | POA: Diagnosis not present

## 2023-01-01 DIAGNOSIS — Z170 Estrogen receptor positive status [ER+]: Secondary | ICD-10-CM | POA: Diagnosis not present

## 2023-01-01 LAB — RAD ONC ARIA SESSION SUMMARY
Course Elapsed Days: 14
Plan Fractions Treated to Date: 10
Plan Fractions Treated to Date: 10
Plan Prescribed Dose Per Fraction: 2.66 Gy
Plan Prescribed Dose Per Fraction: 2.66 Gy
Plan Total Fractions Prescribed: 16
Plan Total Fractions Prescribed: 16
Plan Total Prescribed Dose: 42.56 Gy
Plan Total Prescribed Dose: 42.56 Gy
Reference Point Dosage Given to Date: 26.6 Gy
Reference Point Dosage Given to Date: 26.6 Gy
Reference Point Session Dosage Given: 2.66 Gy
Reference Point Session Dosage Given: 2.66 Gy
Session Number: 10

## 2023-01-02 ENCOUNTER — Other Ambulatory Visit: Payer: Self-pay

## 2023-01-02 ENCOUNTER — Ambulatory Visit: Admission: RE | Admit: 2023-01-02 | Payer: Medicare Other | Source: Ambulatory Visit

## 2023-01-02 ENCOUNTER — Ambulatory Visit: Payer: Medicare Other

## 2023-01-02 DIAGNOSIS — Z510 Encounter for antineoplastic radiation therapy: Secondary | ICD-10-CM | POA: Diagnosis not present

## 2023-01-02 DIAGNOSIS — Z5112 Encounter for antineoplastic immunotherapy: Secondary | ICD-10-CM | POA: Diagnosis not present

## 2023-01-02 DIAGNOSIS — C50412 Malignant neoplasm of upper-outer quadrant of left female breast: Secondary | ICD-10-CM | POA: Diagnosis not present

## 2023-01-02 DIAGNOSIS — Z170 Estrogen receptor positive status [ER+]: Secondary | ICD-10-CM | POA: Diagnosis not present

## 2023-01-02 DIAGNOSIS — C50411 Malignant neoplasm of upper-outer quadrant of right female breast: Secondary | ICD-10-CM | POA: Diagnosis not present

## 2023-01-02 LAB — RAD ONC ARIA SESSION SUMMARY
Course Elapsed Days: 15
Plan Fractions Treated to Date: 11
Plan Fractions Treated to Date: 11
Plan Prescribed Dose Per Fraction: 2.66 Gy
Plan Prescribed Dose Per Fraction: 2.66 Gy
Plan Total Fractions Prescribed: 16
Plan Total Fractions Prescribed: 16
Plan Total Prescribed Dose: 42.56 Gy
Plan Total Prescribed Dose: 42.56 Gy
Reference Point Dosage Given to Date: 29.26 Gy
Reference Point Dosage Given to Date: 29.26 Gy
Reference Point Session Dosage Given: 2.66 Gy
Reference Point Session Dosage Given: 2.66 Gy
Session Number: 11

## 2023-01-03 ENCOUNTER — Other Ambulatory Visit: Payer: Self-pay

## 2023-01-03 ENCOUNTER — Ambulatory Visit: Payer: Medicare Other

## 2023-01-03 ENCOUNTER — Ambulatory Visit
Admission: RE | Admit: 2023-01-03 | Discharge: 2023-01-03 | Disposition: A | Discharge status: Home / Self Care | Payer: Medicare Other | Source: Ambulatory Visit | Attending: Radiation Oncology | Admitting: Radiation Oncology

## 2023-01-03 DIAGNOSIS — C50411 Malignant neoplasm of upper-outer quadrant of right female breast: Secondary | ICD-10-CM | POA: Diagnosis not present

## 2023-01-03 DIAGNOSIS — Z170 Estrogen receptor positive status [ER+]: Secondary | ICD-10-CM | POA: Diagnosis not present

## 2023-01-03 DIAGNOSIS — C50412 Malignant neoplasm of upper-outer quadrant of left female breast: Secondary | ICD-10-CM | POA: Diagnosis not present

## 2023-01-03 DIAGNOSIS — Z5112 Encounter for antineoplastic immunotherapy: Secondary | ICD-10-CM | POA: Diagnosis not present

## 2023-01-03 DIAGNOSIS — Z510 Encounter for antineoplastic radiation therapy: Secondary | ICD-10-CM | POA: Diagnosis not present

## 2023-01-03 LAB — RAD ONC ARIA SESSION SUMMARY
Course Elapsed Days: 16
Plan Fractions Treated to Date: 12
Plan Fractions Treated to Date: 12
Plan Prescribed Dose Per Fraction: 2.66 Gy
Plan Prescribed Dose Per Fraction: 2.66 Gy
Plan Total Fractions Prescribed: 16
Plan Total Fractions Prescribed: 16
Plan Total Prescribed Dose: 42.56 Gy
Plan Total Prescribed Dose: 42.56 Gy
Reference Point Dosage Given to Date: 31.92 Gy
Reference Point Dosage Given to Date: 31.92 Gy
Reference Point Session Dosage Given: 2.66 Gy
Reference Point Session Dosage Given: 2.66 Gy
Session Number: 12

## 2023-01-04 ENCOUNTER — Other Ambulatory Visit: Payer: Self-pay

## 2023-01-04 ENCOUNTER — Ambulatory Visit
Admission: RE | Admit: 2023-01-04 | Discharge: 2023-01-04 | Disposition: A | Discharge status: Home / Self Care | Payer: Medicare Other | Source: Ambulatory Visit | Attending: Radiation Oncology | Admitting: Radiation Oncology

## 2023-01-04 DIAGNOSIS — Z510 Encounter for antineoplastic radiation therapy: Secondary | ICD-10-CM | POA: Diagnosis not present

## 2023-01-04 DIAGNOSIS — Z170 Estrogen receptor positive status [ER+]: Secondary | ICD-10-CM | POA: Diagnosis not present

## 2023-01-04 DIAGNOSIS — C50411 Malignant neoplasm of upper-outer quadrant of right female breast: Secondary | ICD-10-CM | POA: Diagnosis not present

## 2023-01-04 DIAGNOSIS — Z5112 Encounter for antineoplastic immunotherapy: Secondary | ICD-10-CM | POA: Diagnosis not present

## 2023-01-04 DIAGNOSIS — C50412 Malignant neoplasm of upper-outer quadrant of left female breast: Secondary | ICD-10-CM | POA: Diagnosis not present

## 2023-01-04 LAB — RAD ONC ARIA SESSION SUMMARY
Course Elapsed Days: 17
Plan Fractions Treated to Date: 13
Plan Fractions Treated to Date: 13
Plan Prescribed Dose Per Fraction: 2.66 Gy
Plan Prescribed Dose Per Fraction: 2.66 Gy
Plan Total Fractions Prescribed: 16
Plan Total Fractions Prescribed: 16
Plan Total Prescribed Dose: 42.56 Gy
Plan Total Prescribed Dose: 42.56 Gy
Reference Point Dosage Given to Date: 34.58 Gy
Reference Point Dosage Given to Date: 34.58 Gy
Reference Point Session Dosage Given: 2.66 Gy
Reference Point Session Dosage Given: 2.66 Gy
Session Number: 13

## 2023-01-04 NOTE — Progress Notes (Signed)
Patient Care Team: [name], MD as PCP - General (Internal Medicine) [name], MD as Consulting Physician (Hematology and Oncology) Almond Lint, MD as Consulting Physician (General Surgery)  DIAGNOSIS: No diagnosis found.  SUMMARY OF ONCOLOGIC HISTORY: Oncology History  Malignant neoplasm of upper-outer quadrant of left breast in female, estrogen receptor positive (HCC)  04/30/2022 Initial Diagnosis   Screening mammogram to treat left breast mass measuring 0.7 cm, adjacent mass 0.5 cm, biopsy of both revealed grade 3 IDC with DCIS ER 100% PR 100% Ki-67 60%, HER2 3+ positive   05/25/2022 Cancer Staging   Staging form: Breast, AJCC 8th Edition - Clinical: Stage IA (cT1b, cN0, cM0, G3, ER+, PR+, HER2+) - Signed by [name], MD on 05/25/2022 Stage prefix: Initial diagnosis Histologic grading system: 3 grade system   07/19/2022 Surgery   Right lumpectomy: Grade 2 IDC, 1 cm with intermediate grade DCIS, ER 95%, PR 90%,, Ki-67 5%, HER2 -, 0/2 lymph nodes negative, anterior margin positive Left lumpectomy: Grade 3 IDC 1.1 cm with DCIS, margins negative, 0/2 lymph nodes negative, ER 100%, PR 100%, HER2 positive 3+, Ki-67 60%   07/29/2022 Genetic Testing   Negative genetic testing on the CancerNext-Expanded+RNAinsight panel.  The report date is July 29, 2022.  The CancerNext-Expanded gene panel offered by [facility] and includes sequencing and rearrangement analysis for the following 77 genes: AIP, ALK, APC*, ATM*, AXIN2, BAP1, BARD1, BMPR1A, BRCA1*, BRCA2*, BRIP1*, CDC73, CDH1*, CDK4, CDKN1B, CDKN2A, CHEK2*, CTNNA1, DICER1, FH, FLCN, KIF1B, LZTR1, MAX, MEN1, MET, MLH1*, MSH2*, MSH3, MSH6*, MUTYH*, NF1*, NF2, NTHL1, PALB2*, PHOX2B, PMS2*, POT1, PRKAR1A, PTCH1, PTEN*, RAD51C*, RAD51D*, RB1, RET, SDHA, SDHAF2, SDHB, SDHC, SDHD, SMAD4, SMARCA4, SMARCB1, SMARCE1, STK11, SUFU, TMEM127, TP53*, TSC1, TSC2, and VHL (sequencing and deletion/duplication); EGFR, EGLN1, HOXB13, KIT, MITF,  PDGFRA, POLD1, and POLE (sequencing only); EPCAM and GREM1 (deletion/duplication only). DNA and RNA analyses performed for * genes.    08/14/2022 -  Chemotherapy   Patient is on Treatment Plan : BREAST Paclitaxel + Trastuzumab q7d / Trastuzumab q21d       CHIEF COMPLIANT:   INTERVAL HISTORY: [place] is a   ALLERGIES:  is allergic to flagyl [metronidazole].  MEDICATIONS:  Current Outpatient Medications  Medication Sig Dispense Refill   aspirin EC 81 MG tablet Take 81 mg by mouth daily. Swallow whole.     atenolol (TENORMIN) 50 MG tablet Take 50 mg by mouth every evening.     chlorhexidine (PERIDEX) 0.12 % solution 15 mLs 2 (two) times daily.     DULoxetine (CYMBALTA) 60 MG capsule Take 60 mg by mouth every evening.     esomeprazole (NEXIUM) 40 MG capsule Take 40 mg by mouth daily at 12 noon.     lidocaine (XYLOCAINE) 2 % solution 15 mLs.     lidocaine-prilocaine (EMLA) cream APPLY TO AFFECTED AREA ONCE AS DIRECTED 30 g 0   lisinopril-hydrochlorothiazide (ZESTORETIC) 20-25 MG tablet Take 1 tablet by mouth daily.     magic mouthwash (lidocaine, diphenhydrAMINE, alum & mag hydroxide) suspension Swish and swallow 5 mls by mouth 4 times a day as needed for mouth pain 240 mL 2   magic mouthwash w/lidocaine SOLN Take 5 mLs by mouth 4 (four) times daily as needed for mouth pain. 240 mL 2   Multiple Vitamin (MULTIVITAMIN WITH MINERALS) TABS tablet Take 1 tablet by mouth every evening.     prochlorperazine (COMPAZINE) 10 MG tablet Take 1 tablet (10 mg total) by mouth every 6 (six) hours as  needed for nausea or vomiting. 30 tablet 1   terbinafine (LAMISIL) 250 MG tablet Take 1 tablet (250 mg total) by mouth daily. 30 tablet 3   No current facility-administered medications for this visit.    PHYSICAL EXAMINATION: ECOG PERFORMANCE STATUS: {CHL ONC ECOG PS:[phone]}  There were no vitals filed for this visit. There were no vitals filed for this visit.  BREAST:*** No palpable  masses or nodules in either right or left breasts. No palpable axillary supraclavicular or infraclavicular adenopathy no breast tenderness or nipple discharge. (exam performed in the presence of a chaperone)  LABORATORY DATA:  I have reviewed the data as listed    Latest Ref Rng & Units 11/05/2022   12:54 PM 10/29/2022   11:13 AM 10/22/2022    8:44 AM  CMP  Glucose 70 - 99 mg/dL 784  696  295   BUN 8 - 23 mg/dL 14  12  14    Creatinine 0.44 - 1.00 mg/dL 2.84  1.32  4.40   Sodium 135 - 145 mmol/L 136  137  137   Potassium 3.5 - 5.1 mmol/L 3.2  3.1  3.5   Chloride 98 - 111 mmol/L 100  101  102   CO2 22 - 32 mmol/L 29  28  27    Calcium 8.9 - 10.3 mg/dL 9.4  9.4  9.6   Total Protein 6.5 - 8.1 g/dL 6.5  6.4  7.3   Total Bilirubin 0.3 - 1.2 mg/dL 0.6  0.6  0.5   Alkaline Phos 38 - 126 U/L 86  88  89   AST 15 - 41 U/L 25  21  25    ALT 0 - 44 U/L 23  24  28      Lab Results  Component Value Date   WBC 6.0 11/05/2022   HGB 8.6 (L) 11/05/2022   HCT 26.5 (L) 11/05/2022   MCV 92.0 11/05/2022   PLT 373 11/05/2022   NEUTROABS 3.3 11/05/2022    ASSESSMENT & PLAN:  No problem-specific Assessment & Plan notes found for this encounter.    No orders of the defined types were placed in this encounter.  The patient has a good understanding of the overall plan. she agrees with it. she will call with any problems that may develop before the next visit here. Total time spent: 30 mins including face to face time and time spent for planning, charting and co-ordination of care   Sherlyn Lick, CMA 01/04/23    I Janan Ridge am acting as a Neurosurgeon for [employer]  ***

## 2023-01-07 ENCOUNTER — Inpatient Hospital Stay: Payer: Medicare Other

## 2023-01-07 ENCOUNTER — Inpatient Hospital Stay: Payer: Medicare Other | Admitting: Hematology and Oncology

## 2023-01-07 ENCOUNTER — Ambulatory Visit
Admission: RE | Admit: 2023-01-07 | Discharge: 2023-01-07 | Disposition: A | Discharge status: Home / Self Care | Payer: Medicare Other | Source: Ambulatory Visit | Attending: Radiation Oncology | Admitting: Radiation Oncology

## 2023-01-07 ENCOUNTER — Other Ambulatory Visit: Payer: Self-pay

## 2023-01-07 VITALS — BP 134/73 | HR 66 | Resp 18

## 2023-01-07 VITALS — BP 124/77 | HR 71 | Temp 97.5°F | Resp 18 | Ht 67.0 in | Wt 236.4 lb

## 2023-01-07 DIAGNOSIS — C50412 Malignant neoplasm of upper-outer quadrant of left female breast: Secondary | ICD-10-CM | POA: Diagnosis not present

## 2023-01-07 DIAGNOSIS — Z5112 Encounter for antineoplastic immunotherapy: Secondary | ICD-10-CM | POA: Diagnosis not present

## 2023-01-07 DIAGNOSIS — C50411 Malignant neoplasm of upper-outer quadrant of right female breast: Secondary | ICD-10-CM | POA: Diagnosis not present

## 2023-01-07 DIAGNOSIS — Z510 Encounter for antineoplastic radiation therapy: Secondary | ICD-10-CM | POA: Diagnosis not present

## 2023-01-07 DIAGNOSIS — Z170 Estrogen receptor positive status [ER+]: Secondary | ICD-10-CM | POA: Diagnosis not present

## 2023-01-07 DIAGNOSIS — I427 Cardiomyopathy due to drug and external agent: Secondary | ICD-10-CM

## 2023-01-07 LAB — CBC WITH DIFFERENTIAL (CANCER CENTER ONLY)
Abs Immature Granulocytes: 0.05 10*3/uL (ref 0.00–0.07)
Basophils Absolute: 0 10*3/uL (ref 0.0–0.1)
Basophils Relative: 1 %
Eosinophils Absolute: 0.1 10*3/uL (ref 0.0–0.5)
Eosinophils Relative: 2 %
HCT: 30.8 % — ABNORMAL LOW (ref 36.0–46.0)
Hemoglobin: 10.1 g/dL — ABNORMAL LOW (ref 12.0–15.0)
Immature Granulocytes: 1 %
Lymphocytes Relative: 11 %
Lymphs Abs: 0.9 10*3/uL (ref 0.7–4.0)
MCH: 28.2 pg (ref 26.0–34.0)
MCHC: 32.8 g/dL (ref 30.0–36.0)
MCV: 86 fL (ref 80.0–100.0)
Monocytes Absolute: 0.8 10*3/uL (ref 0.1–1.0)
Monocytes Relative: 10 %
Neutro Abs: 6.3 10*3/uL (ref 1.7–7.7)
Neutrophils Relative %: 75 %
Platelet Count: 339 10*3/uL (ref 150–400)
RBC: 3.58 MIL/uL — ABNORMAL LOW (ref 3.87–5.11)
RDW: 14.5 % (ref 11.5–15.5)
WBC Count: 8.2 10*3/uL (ref 4.0–10.5)
nRBC: 0 % (ref 0.0–0.2)

## 2023-01-07 LAB — RAD ONC ARIA SESSION SUMMARY
Course Elapsed Days: 20
Plan Fractions Treated to Date: 14
Plan Fractions Treated to Date: 14
Plan Prescribed Dose Per Fraction: 2.66 Gy
Plan Prescribed Dose Per Fraction: 2.66 Gy
Plan Total Fractions Prescribed: 16
Plan Total Fractions Prescribed: 16
Plan Total Prescribed Dose: 42.56 Gy
Plan Total Prescribed Dose: 42.56 Gy
Reference Point Dosage Given to Date: 37.24 Gy
Reference Point Dosage Given to Date: 37.24 Gy
Reference Point Session Dosage Given: 2.66 Gy
Reference Point Session Dosage Given: 2.66 Gy
Session Number: 14

## 2023-01-07 LAB — CMP (CANCER CENTER ONLY)
ALT: 12 U/L (ref 0–44)
AST: 15 U/L (ref 15–41)
Albumin: 3.8 g/dL (ref 3.5–5.0)
Alkaline Phosphatase: 110 U/L (ref 38–126)
Anion gap: 6 (ref 5–15)
BUN: 12 mg/dL (ref 8–23)
CO2: 28 mmol/L (ref 22–32)
Calcium: 9.5 mg/dL (ref 8.9–10.3)
Chloride: 99 mmol/L (ref 98–111)
Creatinine: 1.05 mg/dL — ABNORMAL HIGH (ref 0.44–1.00)
GFR, Estimated: 58 mL/min — ABNORMAL LOW (ref >60)
Glucose, Bld: 91 mg/dL (ref 70–99)
Potassium: 4.1 mmol/L (ref 3.5–5.1)
Sodium: 133 mmol/L — ABNORMAL LOW (ref 135–145)
Total Bilirubin: 0.4 mg/dL (ref 0.3–1.2)
Total Protein: 7.5 g/dL (ref 6.5–8.1)

## 2023-01-07 MED ORDER — HEPARIN SOD (PORK) LOCK FLUSH 100 UNIT/ML IV SOLN
500.0000 [IU] | Freq: Once | INTRAVENOUS | Status: AC | PRN
  Administered 2023-01-07: 500 [IU]

## 2023-01-07 MED ORDER — TRASTUZUMAB-ANNS CHEMO 150 MG IV SOLR
6.0000 mg/kg | Freq: Once | INTRAVENOUS | Status: AC
  Administered 2023-01-07: 693 mg via INTRAVENOUS
  Filled 2023-01-07: qty 33

## 2023-01-07 MED ORDER — DIPHENHYDRAMINE HCL 25 MG PO CAPS
50.0000 mg | ORAL_CAPSULE | Freq: Once | ORAL | Status: AC
  Administered 2023-01-07: 50 mg via ORAL
  Filled 2023-01-07: qty 2

## 2023-01-07 MED ORDER — SODIUM CHLORIDE 0.9% FLUSH
10.0000 mL | INTRAVENOUS | Status: DC | PRN
  Administered 2023-01-07: 10 mL

## 2023-01-07 MED ORDER — SODIUM CHLORIDE 0.9 % IV SOLN: Freq: Once | INTRAVENOUS | Status: AC

## 2023-01-07 MED ORDER — ACETAMINOPHEN 325 MG PO TABS
650.0000 mg | ORAL_TABLET | Freq: Once | ORAL | Status: AC
  Administered 2023-01-07: 650 mg via ORAL
  Filled 2023-01-07: qty 2

## 2023-01-07 NOTE — Patient Instructions (Signed)
 [place] [facility]  Discharge Instructions: Thank you for choosing [facility] to provide your oncology and hematology care.   If you have a lab appointment with the Cancer Center, please go directly to the Cancer Center and check in at the registration area.   Wear comfortable clothing and clothing appropriate for easy access to any Portacath or PICC line.   We strive to give you quality time with your provider. You may need to reschedule your appointment if you arrive late (15 or more minutes).  Arriving late affects you and other patients whose appointments are after yours.  Also, if you miss three or more appointments without notifying the office, you may be dismissed from the clinic at the provider's discretion.      For prescription refill requests, have your pharmacy contact our office and allow 72 hours for refills to be completed.    Today you received the following chemotherapy and/or immunotherapy agents: Kanjinti.      To help prevent nausea and vomiting after your treatment, we encourage you to take your nausea medication as directed.  BELOW ARE SYMPTOMS THAT SHOULD BE REPORTED IMMEDIATELY: *FEVER GREATER THAN 100.4 F (38 C) OR HIGHER *CHILLS OR SWEATING *NAUSEA AND VOMITING THAT IS NOT CONTROLLED WITH YOUR NAUSEA MEDICATION *UNUSUAL SHORTNESS OF BREATH *UNUSUAL BRUISING OR BLEEDING *URINARY PROBLEMS (pain or burning when urinating, or frequent urination) *BOWEL PROBLEMS (unusual diarrhea, constipation, pain near the anus) TENDERNESS IN MOUTH AND THROAT WITH OR WITHOUT PRESENCE OF ULCERS (sore throat, sores in mouth, or a toothache) UNUSUAL RASH, SWELLING OR PAIN  UNUSUAL VAGINAL DISCHARGE OR ITCHING   Items with * indicate a potential emergency and should be followed up as soon as possible or go to the Emergency Department if any problems should occur.  Please show the CHEMOTHERAPY ALERT CARD or IMMUNOTHERAPY ALERT CARD at  check-in to the Emergency Department and triage nurse.  Should you have questions after your visit or need to cancel or reschedule your appointment, please contact [place] [facility]  Dept: [phone]  and follow the prompts.  Office hours are 8:00 a.m. to 4:30 p.m. Monday - Friday. Please note that voicemails left after 4:00 p.m. may not be returned until the following business day.  We are closed weekends and major holidays. You have access to a nurse at all times for urgent questions. Please call the main number to the clinic Dept: [phone] and follow the prompts.   For any non-urgent questions, you may also contact your provider using MyChart. We now offer e-Visits for anyone 25 and older to request care online for non-urgent symptoms. For details visit [email or web address].   Also download the MyChart app! Go to the app store, search "MyChart", open the app, select [place], and log in with your MyChart username and password. [place] [facility]  Discharge Instructions: Thank you for choosing [facility] to provide your oncology and hematology care.   If you have a lab appointment with the Cancer Center, please go directly to the Cancer Center and check in at the registration area.   Wear comfortable clothing and clothing appropriate for easy access to any Portacath or PICC line.   We strive to give you quality time with your provider. You may need to reschedule your appointment if you arrive late (15 or more minutes).  Arriving late affects you and other patients whose appointments are  after yours.  Also, if you miss three or more appointments without notifying the office, you may be dismissed from the clinic at the provider's discretion.      For prescription refill requests, have your pharmacy contact our office and allow 72 hours for refills to be completed.    Today you received the following  chemotherapy and/or immunotherapy agents Kanjinti.      To help prevent nausea and vomiting after your treatment, we encourage you to take your nausea medication as directed.  BELOW ARE SYMPTOMS THAT SHOULD BE REPORTED IMMEDIATELY: *FEVER GREATER THAN 100.4 F (38 C) OR HIGHER *CHILLS OR SWEATING *NAUSEA AND VOMITING THAT IS NOT CONTROLLED WITH YOUR NAUSEA MEDICATION *UNUSUAL SHORTNESS OF BREATH *UNUSUAL BRUISING OR BLEEDING *URINARY PROBLEMS (pain or burning when urinating, or frequent urination) *BOWEL PROBLEMS (unusual diarrhea, constipation, pain near the anus) TENDERNESS IN MOUTH AND THROAT WITH OR WITHOUT PRESENCE OF ULCERS (sore throat, sores in mouth, or a toothache) UNUSUAL RASH, SWELLING OR PAIN  UNUSUAL VAGINAL DISCHARGE OR ITCHING   Items with * indicate a potential emergency and should be followed up as soon as possible or go to the Emergency Department if any problems should occur.  Please show the CHEMOTHERAPY ALERT CARD or IMMUNOTHERAPY ALERT CARD at check-in to the Emergency Department and triage nurse.  Should you have questions after your visit or need to cancel or reschedule your appointment, please contact [place] [facility]  Dept: [phone]  and follow the prompts.  Office hours are 8:00 a.m. to 4:30 p.m. Monday - Friday. Please note that voicemails left after 4:00 p.m. may not be returned until the following business day.  We are closed weekends and major holidays. You have access to a nurse at all times for urgent questions. Please call the main number to the clinic Dept: [phone] and follow the prompts.   For any non-urgent questions, you may also contact your provider using MyChart. We now offer e-Visits for anyone 71 and older to request care online for non-urgent symptoms. For details visit [email or web address].   Also download the MyChart app! Go to the app store, search "MyChart", open the app, select [place], and log  in with your MyChart username and password.

## 2023-01-07 NOTE — Assessment & Plan Note (Signed)
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  ------------------------------------------------------------------------------------------------------------------------------------------------ Treatment plan: 07/19/2022:Right lumpectomy: Grade 2 IDC, 1 cm with intermediate grade DCIS, ER 95%, PR 90%,, Ki-67 5%, HER2 -, 0/2 lymph nodes negative, anterior margin positive Left lumpectomy: Grade 3 IDC 1.1 cm with DCIS, margins negative, 0/2 lymph nodes negative, ER 100%, PR 100%, HER2 positive 3+, Ki-67 60% Adjuvant chemotherapy with Taxol Herceptin completed 10/29/2022 followed by Herceptin maintenance Adjuvant radiation 12/19/2022-01/19/2023 Adjuvant antiestrogen therapy ------------------------------------------------------------------------  Current treatment: Continue with Herceptin every 3 weeks maintenance therapy   Return to clinic every 3 weeks for Herceptin every 6 weeks of follow-up with me. Radiation is currently ongoing. Nail bed fungus: Oral Terbinafine   RTC every 3 weeks for herceptin and every 6 weeks for labs and MD visit

## 2023-01-08 ENCOUNTER — Other Ambulatory Visit: Payer: Self-pay

## 2023-01-08 ENCOUNTER — Ambulatory Visit: Admission: RE | Admit: 2023-01-08 | Payer: Medicare Other | Source: Ambulatory Visit

## 2023-01-08 ENCOUNTER — Ambulatory Visit: Payer: Medicare Other

## 2023-01-08 DIAGNOSIS — C50411 Malignant neoplasm of upper-outer quadrant of right female breast: Secondary | ICD-10-CM | POA: Diagnosis not present

## 2023-01-08 DIAGNOSIS — Z170 Estrogen receptor positive status [ER+]: Secondary | ICD-10-CM | POA: Diagnosis not present

## 2023-01-08 DIAGNOSIS — Z5112 Encounter for antineoplastic immunotherapy: Secondary | ICD-10-CM | POA: Diagnosis not present

## 2023-01-08 DIAGNOSIS — Z510 Encounter for antineoplastic radiation therapy: Secondary | ICD-10-CM | POA: Diagnosis not present

## 2023-01-08 DIAGNOSIS — C50412 Malignant neoplasm of upper-outer quadrant of left female breast: Secondary | ICD-10-CM | POA: Diagnosis not present

## 2023-01-08 LAB — RAD ONC ARIA SESSION SUMMARY
Course Elapsed Days: 21
Plan Fractions Treated to Date: 15
Plan Fractions Treated to Date: 15
Plan Prescribed Dose Per Fraction: 2.66 Gy
Plan Prescribed Dose Per Fraction: 2.66 Gy
Plan Total Fractions Prescribed: 16
Plan Total Fractions Prescribed: 16
Plan Total Prescribed Dose: 42.56 Gy
Plan Total Prescribed Dose: 42.56 Gy
Reference Point Dosage Given to Date: 39.9 Gy
Reference Point Dosage Given to Date: 39.9 Gy
Reference Point Session Dosage Given: 2.66 Gy
Reference Point Session Dosage Given: 2.66 Gy
Session Number: 15

## 2023-01-09 ENCOUNTER — Ambulatory Visit: Payer: Medicare Other

## 2023-01-09 ENCOUNTER — Other Ambulatory Visit: Payer: Self-pay

## 2023-01-09 ENCOUNTER — Ambulatory Visit: Admission: RE | Admit: 2023-01-09 | Payer: Medicare Other | Source: Ambulatory Visit

## 2023-01-09 DIAGNOSIS — Z510 Encounter for antineoplastic radiation therapy: Secondary | ICD-10-CM | POA: Diagnosis not present

## 2023-01-09 DIAGNOSIS — C50411 Malignant neoplasm of upper-outer quadrant of right female breast: Secondary | ICD-10-CM | POA: Diagnosis not present

## 2023-01-09 DIAGNOSIS — C50412 Malignant neoplasm of upper-outer quadrant of left female breast: Secondary | ICD-10-CM | POA: Diagnosis not present

## 2023-01-09 DIAGNOSIS — Z5112 Encounter for antineoplastic immunotherapy: Secondary | ICD-10-CM | POA: Diagnosis not present

## 2023-01-09 DIAGNOSIS — Z170 Estrogen receptor positive status [ER+]: Secondary | ICD-10-CM | POA: Diagnosis not present

## 2023-01-09 LAB — RAD ONC ARIA SESSION SUMMARY
Course Elapsed Days: 22
Plan Fractions Treated to Date: 16
Plan Fractions Treated to Date: 16
Plan Prescribed Dose Per Fraction: 2.66 Gy
Plan Prescribed Dose Per Fraction: 2.66 Gy
Plan Total Fractions Prescribed: 16
Plan Total Fractions Prescribed: 16
Plan Total Prescribed Dose: 42.56 Gy
Plan Total Prescribed Dose: 42.56 Gy
Reference Point Dosage Given to Date: 42.56 Gy
Reference Point Dosage Given to Date: 42.56 Gy
Reference Point Session Dosage Given: 2.66 Gy
Reference Point Session Dosage Given: 2.66 Gy
Session Number: 16

## 2023-01-10 ENCOUNTER — Telehealth: Payer: Self-pay | Admitting: Hematology and Oncology

## 2023-01-10 ENCOUNTER — Other Ambulatory Visit: Payer: Self-pay

## 2023-01-10 ENCOUNTER — Ambulatory Visit: Payer: Medicare Other

## 2023-01-10 ENCOUNTER — Ambulatory Visit
Admission: RE | Admit: 2023-01-10 | Discharge: 2023-01-10 | Disposition: A | Discharge status: Home / Self Care | Payer: Medicare Other | Source: Ambulatory Visit | Attending: Radiation Oncology | Admitting: Radiation Oncology

## 2023-01-10 DIAGNOSIS — C50412 Malignant neoplasm of upper-outer quadrant of left female breast: Secondary | ICD-10-CM | POA: Diagnosis not present

## 2023-01-10 DIAGNOSIS — Z5112 Encounter for antineoplastic immunotherapy: Secondary | ICD-10-CM | POA: Diagnosis not present

## 2023-01-10 DIAGNOSIS — C50411 Malignant neoplasm of upper-outer quadrant of right female breast: Secondary | ICD-10-CM | POA: Diagnosis not present

## 2023-01-10 DIAGNOSIS — Z170 Estrogen receptor positive status [ER+]: Secondary | ICD-10-CM | POA: Diagnosis not present

## 2023-01-10 DIAGNOSIS — Z510 Encounter for antineoplastic radiation therapy: Secondary | ICD-10-CM | POA: Diagnosis not present

## 2023-01-10 LAB — RAD ONC ARIA SESSION SUMMARY
Course Elapsed Days: 23
Plan Fractions Treated to Date: 1
Plan Fractions Treated to Date: 1
Plan Prescribed Dose Per Fraction: 2 Gy
Plan Prescribed Dose Per Fraction: 2 Gy
Plan Total Fractions Prescribed: 4
Plan Total Fractions Prescribed: 4
Plan Total Prescribed Dose: 8 Gy
Plan Total Prescribed Dose: 8 Gy
Reference Point Dosage Given to Date: 2 Gy
Reference Point Dosage Given to Date: 2 Gy
Reference Point Session Dosage Given: 2 Gy
Reference Point Session Dosage Given: 2 Gy
Session Number: 17

## 2023-01-10 NOTE — Telephone Encounter (Signed)
Scheduled appointments per WQ. Patient is aware of the made appointments.  

## 2023-01-11 ENCOUNTER — Ambulatory Visit: Payer: Medicare Other

## 2023-01-11 ENCOUNTER — Other Ambulatory Visit: Payer: Self-pay

## 2023-01-11 ENCOUNTER — Ambulatory Visit
Admission: RE | Admit: 2023-01-11 | Discharge: 2023-01-11 | Disposition: A | Discharge status: Home / Self Care | Payer: Medicare Other | Source: Ambulatory Visit | Attending: Radiation Oncology | Admitting: Radiation Oncology

## 2023-01-11 DIAGNOSIS — C50412 Malignant neoplasm of upper-outer quadrant of left female breast: Secondary | ICD-10-CM | POA: Diagnosis not present

## 2023-01-11 DIAGNOSIS — Z170 Estrogen receptor positive status [ER+]: Secondary | ICD-10-CM | POA: Diagnosis not present

## 2023-01-11 DIAGNOSIS — Z5112 Encounter for antineoplastic immunotherapy: Secondary | ICD-10-CM | POA: Diagnosis not present

## 2023-01-11 DIAGNOSIS — Z510 Encounter for antineoplastic radiation therapy: Secondary | ICD-10-CM | POA: Diagnosis not present

## 2023-01-11 LAB — RAD ONC ARIA SESSION SUMMARY
Course Elapsed Days: 24
Plan Fractions Treated to Date: 2
Plan Fractions Treated to Date: 2
Plan Prescribed Dose Per Fraction: 2 Gy
Plan Prescribed Dose Per Fraction: 2 Gy
Plan Total Fractions Prescribed: 4
Plan Total Fractions Prescribed: 4
Plan Total Prescribed Dose: 8 Gy
Plan Total Prescribed Dose: 8 Gy
Reference Point Dosage Given to Date: 4 Gy
Reference Point Dosage Given to Date: 4 Gy
Reference Point Session Dosage Given: 2 Gy
Reference Point Session Dosage Given: 2 Gy
Session Number: 18

## 2023-01-14 ENCOUNTER — Other Ambulatory Visit: Payer: Self-pay

## 2023-01-15 ENCOUNTER — Other Ambulatory Visit: Payer: Self-pay

## 2023-01-15 ENCOUNTER — Ambulatory Visit: Payer: Medicare Other

## 2023-01-15 ENCOUNTER — Ambulatory Visit
Admission: RE | Admit: 2023-01-15 | Discharge: 2023-01-15 | Disposition: A | Discharge status: Home / Self Care | Payer: Medicare Other | Source: Ambulatory Visit | Attending: Radiation Oncology | Admitting: Radiation Oncology

## 2023-01-15 ENCOUNTER — Encounter: Payer: Self-pay | Admitting: *Deleted

## 2023-01-15 DIAGNOSIS — C50412 Malignant neoplasm of upper-outer quadrant of left female breast: Secondary | ICD-10-CM | POA: Diagnosis not present

## 2023-01-15 DIAGNOSIS — Z170 Estrogen receptor positive status [ER+]: Secondary | ICD-10-CM | POA: Insufficient documentation

## 2023-01-15 DIAGNOSIS — Z510 Encounter for antineoplastic radiation therapy: Secondary | ICD-10-CM | POA: Insufficient documentation

## 2023-01-15 DIAGNOSIS — Z5112 Encounter for antineoplastic immunotherapy: Secondary | ICD-10-CM | POA: Insufficient documentation

## 2023-01-15 LAB — RAD ONC ARIA SESSION SUMMARY
Course Elapsed Days: 28
Plan Fractions Treated to Date: 3
Plan Fractions Treated to Date: 3
Plan Prescribed Dose Per Fraction: 2 Gy
Plan Prescribed Dose Per Fraction: 2 Gy
Plan Total Fractions Prescribed: 4
Plan Total Fractions Prescribed: 4
Plan Total Prescribed Dose: 8 Gy
Plan Total Prescribed Dose: 8 Gy
Reference Point Dosage Given to Date: 6 Gy
Reference Point Dosage Given to Date: 6 Gy
Reference Point Session Dosage Given: 2 Gy
Reference Point Session Dosage Given: 2 Gy
Session Number: 19

## 2023-01-16 ENCOUNTER — Ambulatory Visit: Admission: RE | Admit: 2023-01-16 | Payer: Medicare Other | Source: Ambulatory Visit

## 2023-01-16 ENCOUNTER — Inpatient Hospital Stay: Payer: Medicare Other | Attending: Hematology and Oncology

## 2023-01-16 ENCOUNTER — Other Ambulatory Visit: Payer: Self-pay

## 2023-01-16 DIAGNOSIS — Z79620 Long term (current) use of immunosuppressive biologic: Secondary | ICD-10-CM | POA: Insufficient documentation

## 2023-01-16 DIAGNOSIS — Z170 Estrogen receptor positive status [ER+]: Secondary | ICD-10-CM | POA: Diagnosis not present

## 2023-01-16 DIAGNOSIS — Z79899 Other long term (current) drug therapy: Secondary | ICD-10-CM | POA: Insufficient documentation

## 2023-01-16 DIAGNOSIS — Z510 Encounter for antineoplastic radiation therapy: Secondary | ICD-10-CM | POA: Diagnosis not present

## 2023-01-16 DIAGNOSIS — Z5112 Encounter for antineoplastic immunotherapy: Secondary | ICD-10-CM | POA: Insufficient documentation

## 2023-01-16 DIAGNOSIS — C50412 Malignant neoplasm of upper-outer quadrant of left female breast: Secondary | ICD-10-CM | POA: Insufficient documentation

## 2023-01-16 DIAGNOSIS — C50411 Malignant neoplasm of upper-outer quadrant of right female breast: Secondary | ICD-10-CM | POA: Diagnosis not present

## 2023-01-16 LAB — RAD ONC ARIA SESSION SUMMARY
Course Elapsed Days: 29
Plan Fractions Treated to Date: 4
Plan Fractions Treated to Date: 4
Plan Prescribed Dose Per Fraction: 2 Gy
Plan Prescribed Dose Per Fraction: 2 Gy
Plan Total Fractions Prescribed: 4
Plan Total Fractions Prescribed: 4
Plan Total Prescribed Dose: 8 Gy
Plan Total Prescribed Dose: 8 Gy
Reference Point Dosage Given to Date: 8 Gy
Reference Point Dosage Given to Date: 8 Gy
Reference Point Session Dosage Given: 2 Gy
Reference Point Session Dosage Given: 2 Gy
Session Number: 20

## 2023-01-16 NOTE — Research (Signed)
I6962, ICE COMPRESS: RANDOMIZED TRIAL OF LIMB CRYOCOMPRESSION VERSUS CONTINUOUS COMPRESSION VERSUS LOW CYCLIC COMPRESSION FOR THE PREVENTION  OF TAXANE-INDUCED PERIPHERAL NEUROPATHY   Patient arrives today Accompanied by her husband  for the 24 Week visit. Just had final radiation treatment.   PROs: Per study protocol, all PROs required for this visit were completed prior to other study activities and completeness has been verified.     ADVERSE EVENTS: [name] reports no AEs.  NEURO ASSESSMENT: The neuro assessment was completed by this nurse. [name] tolerated all testing without complaint.   GIFT CARD: This study does not provide visit compensation.   DISPOSITION: Upon completion off all study requirements, patient was escorted to the lobby.   The patient was thanked for their time and continued voluntary participation in this study. [name] has been provided direct contact information and is encouraged to contact this Nurse for any needs or questions. Patient's next research visit will be at the 52 week mark.  Christy Chance Ivonna Kinnick, RN, BSN, [facility]  Her  [facility] Clinical Research Nurse [facility] Direct Dial [phone]  Pager [phone] 01/16/2023 3:22 PM

## 2023-01-17 NOTE — Radiation Completion Notes (Addendum)
  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   Intent: Curative     ==========DELIVERED PLANS==========  First Treatment Date: 2022-12-18 - Last Treatment Date: 2023-01-16   Plan Name: Breast_L Site: Breast, Left Technique: 3D Mode: Photon Dose Per Fraction: 2.66 Gy Prescribed Dose (Delivered / Prescribed): 42.56 Gy / 42.56 Gy Prescribed Fxs (Delivered / Prescribed): 16 / 16   Plan Name: Breast_L_Bst Site: Breast, Left Technique: 3D Mode: Photon Dose Per Fraction: 2 Gy Prescribed Dose (Delivered / Prescribed): 8 Gy / 8 Gy Prescribed Fxs (Delivered / Prescribed): 4 / 4   Plan Name: Breast_R Site: Breast, Right Technique: 3D Mode: Photon Dose Per Fraction: 2.66 Gy Prescribed Dose (Delivered / Prescribed): 42.56 Gy / 42.56 Gy Prescribed Fxs (Delivered / Prescribed): 16 / 16   Plan Name: Breast_R_Bst Site: Breast, Right Technique: 3D Mode: Photon Dose Per Fraction: 2 Gy Prescribed Dose (Delivered / Prescribed): 8 Gy / 8 Gy Prescribed Fxs (Delivered / Prescribed): 4 / 4     ==========ON TREATMENT VISIT DATES========== 2022-12-21, 2022-12-28, 2023-01-04, 2023-01-11     See weekly On Treatment Notes in Epic for details. The patient tolerated radiation. She developed fatigue and anticipated skin changes in the treatment field. She developed dry desquamation but did not have moist desquamation when she was seen at her last under treatment visit.   The patient will receive a call in about one month from the radiation oncology department. She will continue follow up with Dr. Pamelia Hoit as well.      Osker Mason, PAC

## 2023-01-28 ENCOUNTER — Inpatient Hospital Stay: Payer: Medicare Other

## 2023-01-28 VITALS — BP 128/65 | HR 68 | Temp 98.1°F | Resp 16

## 2023-01-28 DIAGNOSIS — C50412 Malignant neoplasm of upper-outer quadrant of left female breast: Secondary | ICD-10-CM

## 2023-01-28 DIAGNOSIS — Z95828 Presence of other vascular implants and grafts: Secondary | ICD-10-CM

## 2023-01-28 DIAGNOSIS — Z79899 Other long term (current) drug therapy: Secondary | ICD-10-CM | POA: Diagnosis not present

## 2023-01-28 DIAGNOSIS — Z170 Estrogen receptor positive status [ER+]: Secondary | ICD-10-CM | POA: Diagnosis not present

## 2023-01-28 DIAGNOSIS — Z79620 Long term (current) use of immunosuppressive biologic: Secondary | ICD-10-CM | POA: Diagnosis not present

## 2023-01-28 DIAGNOSIS — Z5112 Encounter for antineoplastic immunotherapy: Secondary | ICD-10-CM | POA: Diagnosis not present

## 2023-01-28 MED ORDER — HEPARIN SOD (PORK) LOCK FLUSH 100 UNIT/ML IV SOLN
500.0000 [IU] | Freq: Once | INTRAVENOUS | Status: AC | PRN
  Administered 2023-01-28: 500 [IU]

## 2023-01-28 MED ORDER — SODIUM CHLORIDE 0.9% FLUSH
10.0000 mL | INTRAVENOUS | Status: DC | PRN
  Administered 2023-01-28: 10 mL

## 2023-01-28 MED ORDER — DIPHENHYDRAMINE HCL 25 MG PO CAPS
50.0000 mg | ORAL_CAPSULE | Freq: Once | ORAL | Status: AC
  Administered 2023-01-28: 50 mg via ORAL
  Filled 2023-01-28: qty 2

## 2023-01-28 MED ORDER — ACETAMINOPHEN 325 MG PO TABS
650.0000 mg | ORAL_TABLET | Freq: Once | ORAL | Status: AC
  Administered 2023-01-28: 650 mg via ORAL
  Filled 2023-01-28: qty 2

## 2023-01-28 MED ORDER — SODIUM CHLORIDE 0.9 % IV SOLN: Freq: Once | INTRAVENOUS | Status: AC

## 2023-01-28 MED ORDER — SODIUM CHLORIDE 0.9% FLUSH
10.0000 mL | Freq: Once | INTRAVENOUS | Status: AC
  Administered 2023-01-28: 10 mL

## 2023-01-28 MED ORDER — TRASTUZUMAB-ANNS CHEMO 150 MG IV SOLR
6.0000 mg/kg | Freq: Once | INTRAVENOUS | Status: AC
  Administered 2023-01-28: 693 mg via INTRAVENOUS
  Filled 2023-01-28: qty 33

## 2023-01-28 NOTE — Patient Instructions (Signed)
[place] [facility]  Discharge Instructions: Thank you for choosing [facility] to provide your oncology and hematology care.   If you have a lab appointment with the Cancer Center, please go directly to the Cancer Center and check in at the registration area.   Wear comfortable clothing and clothing appropriate for easy access to any Portacath or PICC line.   We strive to give you quality time with your provider. You may need to reschedule your appointment if you arrive late (15 or more minutes).  Arriving late affects you and other patients whose appointments are after yours.  Also, if you miss three or more appointments without notifying the office, you may be dismissed from the clinic at the provider's discretion.      For prescription refill requests, have your pharmacy contact our office and allow 72 hours for refills to be completed.    Today you received the following chemotherapy and/or immunotherapy agents herceptin      To help prevent nausea and vomiting after your treatment, we encourage you to take your nausea medication as directed.  BELOW ARE SYMPTOMS THAT SHOULD BE REPORTED IMMEDIATELY: *FEVER GREATER THAN 100.4 F (38 C) OR HIGHER *CHILLS OR SWEATING *NAUSEA AND VOMITING THAT IS NOT CONTROLLED WITH YOUR NAUSEA MEDICATION *UNUSUAL SHORTNESS OF BREATH *UNUSUAL BRUISING OR BLEEDING *URINARY PROBLEMS (pain or burning when urinating, or frequent urination) *BOWEL PROBLEMS (unusual diarrhea, constipation, pain near the anus) TENDERNESS IN MOUTH AND THROAT WITH OR WITHOUT PRESENCE OF ULCERS (sore throat, sores in mouth, or a toothache) UNUSUAL RASH, SWELLING OR PAIN  UNUSUAL VAGINAL DISCHARGE OR ITCHING   Items with * indicate a potential emergency and should be followed up as soon as possible or go to the Emergency Department if any problems should occur.  Please show the CHEMOTHERAPY ALERT CARD or IMMUNOTHERAPY ALERT CARD at  check-in to the Emergency Department and triage nurse.  Should you have questions after your visit or need to cancel or reschedule your appointment, please contact [place] [facility]  Dept: [phone]  and follow the prompts.  Office hours are 8:00 a.m. to 4:30 p.m. Monday - Friday. Please note that voicemails left after 4:00 p.m. may not be returned until the following business day.  We are closed weekends and major holidays. You have access to a nurse at all times for urgent questions. Please call the main number to the clinic Dept: [phone] and follow the prompts.   For any non-urgent questions, you may also contact your provider using MyChart. We now offer e-Visits for anyone 18 and older to request care online for non-urgent symptoms. For details visit [email or web address].   Also download the MyChart app! Go to the app store, search "MyChart", open the app, select [place], and log in with your MyChart username and password.   

## 2023-02-12 ENCOUNTER — Ambulatory Visit (HOSPITAL_COMMUNITY)
Admission: RE | Admit: 2023-02-12 | Discharge: 2023-02-12 | Disposition: A | Discharge status: Home / Self Care | Payer: Medicare Other | Source: Ambulatory Visit | Attending: Hematology and Oncology | Admitting: Hematology and Oncology

## 2023-02-12 DIAGNOSIS — I427 Cardiomyopathy due to drug and external agent: Secondary | ICD-10-CM

## 2023-02-12 DIAGNOSIS — I10 Essential (primary) hypertension: Secondary | ICD-10-CM | POA: Insufficient documentation

## 2023-02-12 DIAGNOSIS — C50412 Malignant neoplasm of upper-outer quadrant of left female breast: Secondary | ICD-10-CM | POA: Diagnosis not present

## 2023-02-12 DIAGNOSIS — T451X5A Adverse effect of antineoplastic and immunosuppressive drugs, initial encounter: Secondary | ICD-10-CM | POA: Insufficient documentation

## 2023-02-12 DIAGNOSIS — E785 Hyperlipidemia, unspecified: Secondary | ICD-10-CM | POA: Diagnosis not present

## 2023-02-12 DIAGNOSIS — Z170 Estrogen receptor positive status [ER+]: Secondary | ICD-10-CM

## 2023-02-12 LAB — ECHOCARDIOGRAM COMPLETE
AR max vel: 3.25 cm2
AV Area VTI: 3.31 cm2
AV Area mean vel: 3.21 cm2
AV Mean grad: 4 mm[Hg]
AV Peak grad: 7.5 mm[Hg]
Ao pk vel: 1.37 m/s
Area-P 1/2: 3.56 cm2
S' Lateral: 3.1 cm

## 2023-02-18 ENCOUNTER — Other Ambulatory Visit: Payer: Self-pay | Admitting: *Deleted

## 2023-02-18 DIAGNOSIS — Z170 Estrogen receptor positive status [ER+]: Secondary | ICD-10-CM

## 2023-02-19 ENCOUNTER — Inpatient Hospital Stay: Payer: Medicare Other

## 2023-02-19 ENCOUNTER — Inpatient Hospital Stay: Payer: Medicare Other | Attending: Hematology and Oncology | Admitting: Hematology and Oncology

## 2023-02-19 VITALS — BP 137/66 | HR 51 | Temp 97.9°F | Resp 16

## 2023-02-19 VITALS — BP 130/74 | HR 74 | Temp 97.6°F | Resp 18 | Ht 67.0 in | Wt 232.7 lb

## 2023-02-19 DIAGNOSIS — Z5112 Encounter for antineoplastic immunotherapy: Secondary | ICD-10-CM | POA: Diagnosis not present

## 2023-02-19 DIAGNOSIS — Z1721 Progesterone receptor positive status: Secondary | ICD-10-CM | POA: Diagnosis not present

## 2023-02-19 DIAGNOSIS — Z79620 Long term (current) use of immunosuppressive biologic: Secondary | ICD-10-CM | POA: Diagnosis not present

## 2023-02-19 DIAGNOSIS — Z79811 Long term (current) use of aromatase inhibitors: Secondary | ICD-10-CM | POA: Insufficient documentation

## 2023-02-19 DIAGNOSIS — Z170 Estrogen receptor positive status [ER+]: Secondary | ICD-10-CM | POA: Insufficient documentation

## 2023-02-19 DIAGNOSIS — Z79899 Other long term (current) drug therapy: Secondary | ICD-10-CM | POA: Insufficient documentation

## 2023-02-19 DIAGNOSIS — N6320 Unspecified lump in the left breast, unspecified quadrant: Secondary | ICD-10-CM | POA: Insufficient documentation

## 2023-02-19 DIAGNOSIS — Z1731 Human epidermal growth factor receptor 2 positive status: Secondary | ICD-10-CM | POA: Diagnosis not present

## 2023-02-19 DIAGNOSIS — Z881 Allergy status to other antibiotic agents status: Secondary | ICD-10-CM | POA: Insufficient documentation

## 2023-02-19 DIAGNOSIS — R531 Weakness: Secondary | ICD-10-CM | POA: Insufficient documentation

## 2023-02-19 DIAGNOSIS — C50412 Malignant neoplasm of upper-outer quadrant of left female breast: Secondary | ICD-10-CM | POA: Diagnosis not present

## 2023-02-19 LAB — CBC WITH DIFFERENTIAL (CANCER CENTER ONLY)
Abs Immature Granulocytes: 0.05 10*3/uL (ref 0.00–0.07)
Basophils Absolute: 0 10*3/uL (ref 0.0–0.1)
Basophils Relative: 1 %
Eosinophils Absolute: 0.1 10*3/uL (ref 0.0–0.5)
Eosinophils Relative: 2 %
HCT: 31.6 % — ABNORMAL LOW (ref 36.0–46.0)
Hemoglobin: 10.4 g/dL — ABNORMAL LOW (ref 12.0–15.0)
Immature Granulocytes: 1 %
Lymphocytes Relative: 21 %
Lymphs Abs: 1.3 10*3/uL (ref 0.7–4.0)
MCH: 26.9 pg (ref 26.0–34.0)
MCHC: 32.9 g/dL (ref 30.0–36.0)
MCV: 81.7 fL (ref 80.0–100.0)
Monocytes Absolute: 0.5 10*3/uL (ref 0.1–1.0)
Monocytes Relative: 9 %
Neutro Abs: 4.1 10*3/uL (ref 1.7–7.7)
Neutrophils Relative %: 66 %
Platelet Count: 367 10*3/uL (ref 150–400)
RBC: 3.87 MIL/uL (ref 3.87–5.11)
RDW: 15.7 % — ABNORMAL HIGH (ref 11.5–15.5)
WBC Count: 6.2 10*3/uL (ref 4.0–10.5)
nRBC: 0 % (ref 0.0–0.2)

## 2023-02-19 LAB — CMP (CANCER CENTER ONLY)
ALT: 14 U/L (ref 0–44)
AST: 18 U/L (ref 15–41)
Albumin: 4.1 g/dL (ref 3.5–5.0)
Alkaline Phosphatase: 97 U/L (ref 38–126)
Anion gap: 8 (ref 5–15)
BUN: 18 mg/dL (ref 8–23)
CO2: 27 mmol/L (ref 22–32)
Calcium: 9.8 mg/dL (ref 8.9–10.3)
Chloride: 102 mmol/L (ref 98–111)
Creatinine: 1.03 mg/dL — ABNORMAL HIGH (ref 0.44–1.00)
GFR, Estimated: 60 mL/min — ABNORMAL LOW (ref >60)
Glucose, Bld: 106 mg/dL — ABNORMAL HIGH (ref 70–99)
Potassium: 3.5 mmol/L (ref 3.5–5.1)
Sodium: 137 mmol/L (ref 135–145)
Total Bilirubin: 0.4 mg/dL (ref 0.3–1.2)
Total Protein: 7.7 g/dL (ref 6.5–8.1)

## 2023-02-19 MED ORDER — ANASTROZOLE 1 MG PO TABS: 1.0000 mg | ORAL_TABLET | Freq: Every day | ORAL | 3 refills | Status: DC

## 2023-02-19 MED ORDER — HEPARIN SOD (PORK) LOCK FLUSH 100 UNIT/ML IV SOLN
500.0000 [IU] | Freq: Once | INTRAVENOUS | Status: AC | PRN
  Administered 2023-02-19: 500 [IU]

## 2023-02-19 MED ORDER — TRASTUZUMAB-ANNS CHEMO 150 MG IV SOLR
6.0000 mg/kg | Freq: Once | INTRAVENOUS | Status: AC
  Administered 2023-02-19: 693 mg via INTRAVENOUS
  Filled 2023-02-19: qty 33

## 2023-02-19 MED ORDER — ACETAMINOPHEN 325 MG PO TABS
650.0000 mg | ORAL_TABLET | Freq: Once | ORAL | Status: AC
  Administered 2023-02-19: 650 mg via ORAL
  Filled 2023-02-19: qty 2

## 2023-02-19 MED ORDER — SODIUM CHLORIDE 0.9% FLUSH
10.0000 mL | INTRAVENOUS | Status: DC | PRN
  Administered 2023-02-19: 10 mL

## 2023-02-19 MED ORDER — DIPHENHYDRAMINE HCL 25 MG PO CAPS
50.0000 mg | ORAL_CAPSULE | Freq: Once | ORAL | Status: AC
  Administered 2023-02-19: 50 mg via ORAL
  Filled 2023-02-19: qty 2

## 2023-02-19 MED ORDER — SODIUM CHLORIDE 0.9 % IV SOLN: Freq: Once | INTRAVENOUS | Status: AC

## 2023-02-19 NOTE — Patient Instructions (Signed)
[facility]  Discharge Instructions: Thank you for choosing [facility] to provide your oncology and hematology care.   If you have a lab appointment with the Cancer Center, please go directly to the Cancer Center and check in at the registration area.   Wear comfortable clothing and clothing appropriate for easy access to any Portacath or PICC line.   We strive to give you quality time with your provider. You may need to reschedule your appointment if you arrive late (15 or more minutes).  Arriving late affects you and other patients whose appointments are after yours.  Also, if you miss three or more appointments without notifying the office, you may be dismissed from the clinic at the provider's discretion.      For prescription refill requests, have your pharmacy contact our office and allow 72 hours for refills to be completed.    Today you received the following chemotherapy and/or immunotherapy agent: Trastuzumab (Kanjinti)   To help prevent nausea and vomiting after your treatment, we encourage you to take your nausea medication as directed.  BELOW ARE SYMPTOMS THAT SHOULD BE REPORTED IMMEDIATELY: *FEVER GREATER THAN 100.4 F (38 C) OR HIGHER *CHILLS OR SWEATING *NAUSEA AND VOMITING THAT IS NOT CONTROLLED WITH YOUR NAUSEA MEDICATION *UNUSUAL SHORTNESS OF BREATH *UNUSUAL BRUISING OR BLEEDING *URINARY PROBLEMS (pain or burning when urinating, or frequent urination) *BOWEL PROBLEMS (unusual diarrhea, constipation, pain near the anus) TENDERNESS IN MOUTH AND THROAT WITH OR WITHOUT PRESENCE OF ULCERS (sore throat, sores in mouth, or a toothache) UNUSUAL RASH, SWELLING OR PAIN  UNUSUAL VAGINAL DISCHARGE OR ITCHING   Items with * indicate a potential emergency and should be followed up as soon as possible or go to the Emergency Department if any problems should occur.  Please show the CHEMTrastuzumab Injection What is this  medication? TRASTUZUMAB (tras TOO zoo mab) treats breast cancer and stomach cancer. It works by blocking a protein that causes cancer cells to grow and multiply. This helps to slow or stop the spread of cancer cells. This medicine may be used for other purposes; ask your health care provider or pharmacist if you have questions. COMMON BRAND NAME(S): Herceptin, Marlowe Alt, Ontruzant, Trazimera What should I tell my care team before I take this medication? They need to know if you have any of these conditions: Heart failure Lung disease An unusual or allergic reaction to trastuzumab, other medications, foods, dyes, or preservatives Pregnant or trying to get pregnant Breast-feeding How should I use this medication? This medication is injected into a vein. It is given by your care team in a hospital or clinic setting. Talk to your care team about the use of this medication in children. It is not approved for use in children. Overdosage: If you think you have taken too much of this medicine contact a poison control center or emergency room at once. NOTE: This medicine is only for you. Do not share this medicine with others. What if I miss a dose? Keep appointments for follow-up doses. It is important not to miss your dose. Call your care team if you are unable to keep an appointment. What may interact with this medication? Certain types of chemotherapy, such as daunorubicin, doxorubicin, epirubicin, idarubicin This list may not describe all possible interactions. Give your health care provider a list of all the medicines, herbs, non-prescription drugs, or dietary supplements you use. Also tell them if you smoke, drink alcohol, or use illegal  drugs. Some items may interact with your medicine. What should I watch for while using this medication? Your condition will be monitored carefully while you are receiving this medication. This medication may make you feel generally unwell. This  is not uncommon, as chemotherapy affects healthy cells as well as cancer cells. Report any side effects. Continue your course of treatment even though you feel ill unless your care team tells you to stop. This medication may increase your risk of getting an infection. Call your care team for advice if you get a fever, chills, sore throat, or other symptoms of a cold or flu. Do not treat yourself. Try to avoid being around people who are sick. Avoid taking medications that contain aspirin, acetaminophen, ibuprofen, naproxen, or ketoprofen unless instructed by your care team. These medications can hide a fever. Talk to your care team if you may be pregnant. Serious birth defects can occur if you take this medication during pregnancy and for 7 months after the last dose. You will need a negative pregnancy test before starting this medication. Contraception is recommended while taking this medication and for 7 months after the last dose. Your care team can help you find the option that works for you. Do not breastfeed while taking this medication and for 7 months after stopping treatment. What side effects may I notice from receiving this medication? Side effects that you should report to your care team as soon as possible: Allergic reactions or angioedema--skin rash, itching or hives, swelling of the face, eyes, lips, tongue, arms, or legs, trouble swallowing or breathing Dry cough, shortness of breath or trouble breathing Heart failure--shortness of breath, swelling of the ankles, feet, or hands, sudden weight gain, unusual weakness or fatigue Infection--fever, chills, cough, or sore throat Infusion reactions--chest pain, shortness of breath or trouble breathing, feeling faint or lightheaded Side effects that usually do not require medical attention (report to your care team if they continue or are bothersome): Diarrhea Dizziness Headache Nausea Trouble sleeping Vomiting This list may not describe  all possible side effects. Call your doctor for medical advice about side effects. You may report side effects to FDA at 1-800-FDA-1088. Where should I keep my medication? This medication is given in a hospital or clinic. It will not be stored at home. NOTE: This sheet is a summary. It may not cover all possible information. If you have questions about this medicine, talk to your doctor, pharmacist, or health care provider.  2024 Elsevier/Gold Standard (2021-09-12 00:00:00) OTHERAPY ALERT CARD or IMMUNOTHERAPY ALERT CARD at check-in to the Emergency Department and triage nurse.  Should you have questions after your visit or need to cancel or reschedule your appointment, please contact [facility]  Dept: [phone]  and follow the prompts.  Office hours are 8:00 a.m. to 4:30 p.m. Monday - Friday. Please note that voicemails left after 4:00 p.m. may not be returned until the following business day.  We are closed weekends and major holidays. You have access to a nurse at all times for urgent questions. Please call the main number to the clinic Dept: [phone] and follow the prompts.   For any non-urgent questions, you may also contact your provider using MyChart. We now offer e-Visits for anyone 56 and older to request care online for non-urgent symptoms. For details visit [email or web address].   Also download the MyChart app! Go to the app store, search "MyChart", open the app, select [facility], and log in with your  MyChart username and password.

## 2023-02-19 NOTE — Progress Notes (Signed)
Patient Care Team: [name], MD as PCP - General (Internal Medicine) [name], MD as Consulting Physician (Hematology and Oncology) Almond Lint, MD as Consulting Physician (General Surgery)  DIAGNOSIS:  Encounter Diagnosis  Name Primary?   Malignant neoplasm of upper-outer quadrant of left breast in female, estrogen receptor positive (HCC) Yes    SUMMARY OF ONCOLOGIC HISTORY: Oncology History  Malignant neoplasm of upper-outer quadrant of left breast in female, estrogen receptor positive (HCC)  04/30/2022 Initial Diagnosis   Screening mammogram to treat left breast mass measuring 0.7 cm, adjacent mass 0.5 cm, biopsy of both revealed grade 3 IDC with DCIS ER 100% PR 100% Ki-67 60%, HER2 3+ positive   05/25/2022 Cancer Staging   Staging form: Breast, AJCC 8th Edition - Clinical: Stage IA (cT1b, cN0, cM0, G3, ER+, PR+, HER2+) - Signed by [name], MD on 05/25/2022 Stage prefix: Initial diagnosis Histologic grading system: 3 grade system   07/19/2022 Surgery   Right lumpectomy: Grade 2 IDC, 1 cm with intermediate grade DCIS, ER 95%, PR 90%,, Ki-67 5%, HER2 -, 0/2 lymph nodes negative, anterior margin positive Left lumpectomy: Grade 3 IDC 1.1 cm with DCIS, margins negative, 0/2 lymph nodes negative, ER 100%, PR 100%, HER2 positive 3+, Ki-67 60%   07/29/2022 Genetic Testing   Negative genetic testing on the CancerNext-Expanded+RNAinsight panel.  The report date is July 29, 2022.  The CancerNext-Expanded gene panel offered by [facility] and includes sequencing and rearrangement analysis for the following 77 genes: AIP, ALK, APC*, ATM*, AXIN2, BAP1, BARD1, BMPR1A, BRCA1*, BRCA2*, BRIP1*, CDC73, CDH1*, CDK4, CDKN1B, CDKN2A, CHEK2*, CTNNA1, DICER1, FH, FLCN, KIF1B, LZTR1, MAX, MEN1, MET, MLH1*, MSH2*, MSH3, MSH6*, MUTYH*, NF1*, NF2, NTHL1, PALB2*, PHOX2B, PMS2*, POT1, PRKAR1A, PTCH1, PTEN*, RAD51C*, RAD51D*, RB1, RET, SDHA, SDHAF2, SDHB, SDHC, SDHD, SMAD4, SMARCA4, SMARCB1,  SMARCE1, STK11, SUFU, TMEM127, TP53*, TSC1, TSC2, and VHL (sequencing and deletion/duplication); EGFR, EGLN1, HOXB13, KIT, MITF, PDGFRA, POLD1, and POLE (sequencing only); EPCAM and GREM1 (deletion/duplication only). DNA and RNA analyses performed for * genes.    08/14/2022 -  Chemotherapy   Patient is on Treatment Plan : BREAST Paclitaxel + Trastuzumab q7d / Trastuzumab q21d       CHIEF COMPLIANT: Follow-up on Herceptin maintenance  History of Present Illness   The patient, with a history of breast cancer, presents for a follow-up visit after completing radiation therapy. She reports feeling well overall, with improving energy levels. She has been tolerating Herceptin well, with no reported bowel issues or diarrhea. She has also noticed hair regrowth since the completion of radiation therapy.  The patient has been working from home for two hours a day, but reports feeling weak after being on her feet for extended periods. She is considering returning to work full-time from November 1st, but is unsure about her strength and stamina.  She has a history of anemia, which has improved. She is due to complete Herceptin treatments in March. She has been taking a multivitamin, which includes vitamin D, and has had previous bone density tests, which were normal.       ALLERGIES:  is allergic to flagyl [metronidazole].  MEDICATIONS:  Current Outpatient Medications  Medication Sig Dispense Refill   anastrozole (ARIMIDEX) 1 MG tablet Take 1 tablet (1 mg total) by mouth daily. 90 tablet 3   aspirin EC 81 MG tablet Take 81 mg by mouth daily. Swallow whole.     atenolol (TENORMIN) 50 MG tablet Take 50 mg by mouth every evening.  chlorhexidine (PERIDEX) 0.12 % solution 15 mLs 2 (two) times daily.     DULoxetine (CYMBALTA) 60 MG capsule Take 60 mg by mouth every evening.     esomeprazole (NEXIUM) 40 MG capsule Take 40 mg by mouth daily at 12 noon.     lidocaine (XYLOCAINE) 2 % solution 15 mLs.      lidocaine-prilocaine (EMLA) cream APPLY TO AFFECTED AREA ONCE AS DIRECTED 30 g 0   lisinopril-hydrochlorothiazide (ZESTORETIC) 20-25 MG tablet Take 1 tablet by mouth daily.     magic mouthwash (lidocaine, diphenhydrAMINE, alum & mag hydroxide) suspension Swish and swallow 5 mls by mouth 4 times a day as needed for mouth pain 240 mL 2   magic mouthwash w/lidocaine SOLN Take 5 mLs by mouth 4 (four) times daily as needed for mouth pain. 240 mL 2   Multiple Vitamin (MULTIVITAMIN WITH MINERALS) TABS tablet Take 1 tablet by mouth every evening.     terbinafine (LAMISIL) 250 MG tablet Take 1 tablet (250 mg total) by mouth daily. 30 tablet 3   No current facility-administered medications for this visit.    PHYSICAL EXAMINATION: ECOG PERFORMANCE STATUS: 1 - Symptomatic but completely ambulatory  Vitals:   02/19/23 1053  BP: 130/74  Pulse: 74  Resp: 18  Temp: 97.6 F (36.4 C)  SpO2: 98%   Filed Weights   02/19/23 1053  Weight: 232 lb 11.2 oz (105.6 kg)     LABORATORY DATA:  I have reviewed the data as listed    Latest Ref Rng & Units 01/07/2023   11:19 AM 11/05/2022   12:54 PM 10/29/2022   11:13 AM  CMP  Glucose 70 - 99 mg/dL 91  308  657   BUN 8 - 23 mg/dL 12  14  12    Creatinine 0.44 - 1.00 mg/dL 8.46  9.62  9.52   Sodium 135 - 145 mmol/L 133  136  137   Potassium 3.5 - 5.1 mmol/L 4.1  3.2  3.1   Chloride 98 - 111 mmol/L 99  100  101   CO2 22 - 32 mmol/L 28  29  28    Calcium 8.9 - 10.3 mg/dL 9.5  9.4  9.4   Total Protein 6.5 - 8.1 g/dL 7.5  6.5  6.4   Total Bilirubin 0.3 - 1.2 mg/dL 0.4  0.6  0.6   Alkaline Phos 38 - 126 U/L 110  86  88   AST 15 - 41 U/L 15  25  21    ALT 0 - 44 U/L 12  23  24      Lab Results  Component Value Date   WBC 6.2 02/19/2023   HGB 10.4 (L) 02/19/2023   HCT 31.6 (L) 02/19/2023   MCV 81.7 02/19/2023   PLT 367 02/19/2023   NEUTROABS 4.1 02/19/2023    ASSESSMENT & PLAN:  Malignant neoplasm of upper-outer quadrant of left breast in female,  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  ------------------------------------------------------------------------------------------------------------------------------------------------ Treatment plan: 07/19/2022:Right lumpectomy: Grade 2 IDC, 1 cm with intermediate grade DCIS, ER 95%, PR 90%,, Ki-67 5%, HER2 -, 0/2 lymph nodes negative, anterior margin positive Left lumpectomy: Grade 3 IDC 1.1 cm with DCIS, margins negative, 0/2 lymph nodes negative, ER 100%, PR 100%, HER2 positive 3+, Ki-67 60% Adjuvant chemotherapy with Taxol Herceptin completed 10/29/2022 followed by Herceptin maintenance Adjuvant radiation 12/19/2022-01/16/2023 Adjuvant antiestrogen therapy started 02/19/2023 ------------------------------------------------------------------------  Current treatment: Continue with Herceptin every 3 weeks maintenance therapy   Anastrozole counseling: We discussed the risks and benefits of anti-estrogen therapy with aromatase inhibitors. These include but not limited to insomnia, hot flashes, mood changes, vaginal dryness, bone density loss, and weight gain. We strongly believe that the benefits far outweigh the risks. Patient understands these risks and consented to starting treatment. Planned treatment duration is 7 years.  I gave her a letter to return to work from 03/15/2023  RTC every 3 weeks for herceptin and every 6 weeks for labs and MD visit No orders of the defined types were placed in this encounter.  The patient has a good understanding of the overall plan. she agrees with it. she will call with any problems that may develop before the next visit here. Total time spent: 30 mins including face to face time and time spent for planning, charting and co-ordination of care   Tamsen Meek, MD 02/19/23

## 2023-02-19 NOTE — Assessment & Plan Note (Addendum)
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  ------------------------------------------------------------------------------------------------------------------------------------------------ Treatment plan: 07/19/2022:Right lumpectomy: Grade 2 IDC, 1 cm with intermediate grade DCIS, ER 95%, PR 90%,, Ki-67 5%, HER2 -, 0/2 lymph nodes negative, anterior margin positive Left lumpectomy: Grade 3 IDC 1.1 cm with DCIS, margins negative, 0/2 lymph nodes negative, ER 100%, PR 100%, HER2 positive 3+, Ki-67 60% Adjuvant chemotherapy with Taxol Herceptin completed 10/29/2022 followed by Herceptin maintenance Adjuvant radiation 12/19/2022-01/16/2023 Adjuvant antiestrogen therapy started 02/19/2023 ------------------------------------------------------------------------  Current treatment: Continue with Herceptin every 3 weeks maintenance therapy   Anastrozole counseling: We discussed the risks and benefits of anti-estrogen therapy with aromatase inhibitors. These include but not limited to insomnia, hot flashes, mood changes, vaginal dryness, bone density loss, and weight gain. We strongly believe that the benefits far outweigh the risks. Patient understands these risks and consented to starting treatment. Planned treatment duration is 7 years.  I gave her a letter to return to work from 03/15/2023  RTC every 3 weeks for herceptin and every 6 weeks for labs and MD visit

## 2023-02-25 DIAGNOSIS — D6481 Anemia due to antineoplastic chemotherapy: Secondary | ICD-10-CM | POA: Diagnosis not present

## 2023-02-25 DIAGNOSIS — I10 Essential (primary) hypertension: Secondary | ICD-10-CM | POA: Diagnosis not present

## 2023-02-25 DIAGNOSIS — Z299 Encounter for prophylactic measures, unspecified: Secondary | ICD-10-CM | POA: Diagnosis not present

## 2023-02-25 DIAGNOSIS — Z23 Encounter for immunization: Secondary | ICD-10-CM | POA: Diagnosis not present

## 2023-02-25 DIAGNOSIS — G622 Polyneuropathy due to other toxic agents: Secondary | ICD-10-CM | POA: Diagnosis not present

## 2023-02-26 ENCOUNTER — Other Ambulatory Visit: Payer: Self-pay

## 2023-03-08 ENCOUNTER — Other Ambulatory Visit: Payer: Self-pay | Admitting: *Deleted

## 2023-03-08 DIAGNOSIS — Z170 Estrogen receptor positive status [ER+]: Secondary | ICD-10-CM

## 2023-03-12 ENCOUNTER — Inpatient Hospital Stay: Payer: Medicare Other

## 2023-03-12 VITALS — BP 138/75 | HR 71 | Temp 97.6°F | Resp 16

## 2023-03-12 DIAGNOSIS — Z881 Allergy status to other antibiotic agents status: Secondary | ICD-10-CM | POA: Diagnosis not present

## 2023-03-12 DIAGNOSIS — C50412 Malignant neoplasm of upper-outer quadrant of left female breast: Secondary | ICD-10-CM | POA: Diagnosis not present

## 2023-03-12 DIAGNOSIS — Z170 Estrogen receptor positive status [ER+]: Secondary | ICD-10-CM

## 2023-03-12 DIAGNOSIS — R531 Weakness: Secondary | ICD-10-CM | POA: Diagnosis not present

## 2023-03-12 DIAGNOSIS — Z5112 Encounter for antineoplastic immunotherapy: Secondary | ICD-10-CM | POA: Diagnosis not present

## 2023-03-12 DIAGNOSIS — Z1721 Progesterone receptor positive status: Secondary | ICD-10-CM | POA: Diagnosis not present

## 2023-03-12 DIAGNOSIS — Z1731 Human epidermal growth factor receptor 2 positive status: Secondary | ICD-10-CM | POA: Diagnosis not present

## 2023-03-12 DIAGNOSIS — N6320 Unspecified lump in the left breast, unspecified quadrant: Secondary | ICD-10-CM | POA: Diagnosis not present

## 2023-03-12 DIAGNOSIS — Z79899 Other long term (current) drug therapy: Secondary | ICD-10-CM | POA: Diagnosis not present

## 2023-03-12 DIAGNOSIS — Z79811 Long term (current) use of aromatase inhibitors: Secondary | ICD-10-CM | POA: Diagnosis not present

## 2023-03-12 DIAGNOSIS — Z79620 Long term (current) use of immunosuppressive biologic: Secondary | ICD-10-CM | POA: Diagnosis not present

## 2023-03-12 LAB — CMP (CANCER CENTER ONLY)
ALT: 11 U/L (ref 0–44)
AST: 13 U/L — ABNORMAL LOW (ref 15–41)
Albumin: 3.9 g/dL (ref 3.5–5.0)
Alkaline Phosphatase: 107 U/L (ref 38–126)
Anion gap: 6 (ref 5–15)
BUN: 14 mg/dL (ref 8–23)
CO2: 30 mmol/L (ref 22–32)
Calcium: 9.7 mg/dL (ref 8.9–10.3)
Chloride: 102 mmol/L (ref 98–111)
Creatinine: 0.98 mg/dL (ref 0.44–1.00)
GFR, Estimated: >60 mL/min (ref >60)
Glucose, Bld: 98 mg/dL (ref 70–99)
Potassium: 3.6 mmol/L (ref 3.5–5.1)
Sodium: 138 mmol/L (ref 135–145)
Total Bilirubin: 0.4 mg/dL (ref 0.3–1.2)
Total Protein: 7.5 g/dL (ref 6.5–8.1)

## 2023-03-12 LAB — CBC WITH DIFFERENTIAL (CANCER CENTER ONLY)
Abs Immature Granulocytes: 0.05 10*3/uL (ref 0.00–0.07)
Basophils Absolute: 0 10*3/uL (ref 0.0–0.1)
Basophils Relative: 1 %
Eosinophils Absolute: 0.2 10*3/uL (ref 0.0–0.5)
Eosinophils Relative: 3 %
HCT: 30.6 % — ABNORMAL LOW (ref 36.0–46.0)
Hemoglobin: 9.8 g/dL — ABNORMAL LOW (ref 12.0–15.0)
Immature Granulocytes: 1 %
Lymphocytes Relative: 21 %
Lymphs Abs: 1.2 10*3/uL (ref 0.7–4.0)
MCH: 26.3 pg (ref 26.0–34.0)
MCHC: 32 g/dL (ref 30.0–36.0)
MCV: 82 fL (ref 80.0–100.0)
Monocytes Absolute: 0.6 10*3/uL (ref 0.1–1.0)
Monocytes Relative: 10 %
Neutro Abs: 3.8 10*3/uL (ref 1.7–7.7)
Neutrophils Relative %: 64 %
Platelet Count: 358 10*3/uL (ref 150–400)
RBC: 3.73 MIL/uL — ABNORMAL LOW (ref 3.87–5.11)
RDW: 15.9 % — ABNORMAL HIGH (ref 11.5–15.5)
WBC Count: 5.9 10*3/uL (ref 4.0–10.5)
nRBC: 0 % (ref 0.0–0.2)

## 2023-03-12 MED ORDER — SODIUM CHLORIDE 0.9 % IV SOLN: Freq: Once | INTRAVENOUS | Status: AC

## 2023-03-12 MED ORDER — SODIUM CHLORIDE 0.9 % IV SOLN
6.0000 mg/kg | Freq: Once | INTRAVENOUS | Status: AC
  Administered 2023-03-12: 693 mg via INTRAVENOUS
  Filled 2023-03-12: qty 33

## 2023-03-12 MED ORDER — HEPARIN SOD (PORK) LOCK FLUSH 100 UNIT/ML IV SOLN
500.0000 [IU] | Freq: Once | INTRAVENOUS | Status: AC | PRN
  Administered 2023-03-12: 500 [IU]

## 2023-03-12 MED ORDER — DIPHENHYDRAMINE HCL 25 MG PO CAPS
50.0000 mg | ORAL_CAPSULE | Freq: Once | ORAL | Status: AC
Start: 2023-03-12 — End: 2023-03-12
  Administered 2023-03-12: 50 mg via ORAL
  Filled 2023-03-12: qty 2

## 2023-03-12 MED ORDER — ACETAMINOPHEN 325 MG PO TABS
650.0000 mg | ORAL_TABLET | Freq: Once | ORAL | Status: AC
  Administered 2023-03-12: 650 mg via ORAL
  Filled 2023-03-12: qty 2

## 2023-03-12 MED ORDER — SODIUM CHLORIDE 0.9% FLUSH
10.0000 mL | INTRAVENOUS | Status: DC | PRN
  Administered 2023-03-12: 10 mL

## 2023-03-12 NOTE — Patient Instructions (Signed)
[facility]  Discharge Instructions: Thank you for choosing [facility] to provide your oncology and hematology care.   If you have a lab appointment with the Cancer Center, please go directly to the Cancer Center and check in at the registration area.   Wear comfortable clothing and clothing appropriate for easy access to any Portacath or PICC line.   We strive to give you quality time with your provider. You may need to reschedule your appointment if you arrive late (15 or more minutes).  Arriving late affects you and other patients whose appointments are after yours.  Also, if you miss three or more appointments without notifying the office, you may be dismissed from the clinic at the provider's discretion.      For prescription refill requests, have your pharmacy contact our office and allow 72 hours for refills to be completed.    Today you received the following chemotherapy and/or immunotherapy agents: trastuzumab-anns      To help prevent nausea and vomiting after your treatment, we encourage you to take your nausea medication as directed.  BELOW ARE SYMPTOMS THAT SHOULD BE REPORTED IMMEDIATELY: *FEVER GREATER THAN 100.4 F (38 C) OR HIGHER *CHILLS OR SWEATING *NAUSEA AND VOMITING THAT IS NOT CONTROLLED WITH YOUR NAUSEA MEDICATION *UNUSUAL SHORTNESS OF BREATH *UNUSUAL BRUISING OR BLEEDING *URINARY PROBLEMS (pain or burning when urinating, or frequent urination) *BOWEL PROBLEMS (unusual diarrhea, constipation, pain near the anus) TENDERNESS IN MOUTH AND THROAT WITH OR WITHOUT PRESENCE OF ULCERS (sore throat, sores in mouth, or a toothache) UNUSUAL RASH, SWELLING OR PAIN  UNUSUAL VAGINAL DISCHARGE OR ITCHING   Items with * indicate a potential emergency and should be followed up as soon as possible or go to the Emergency Department if any problems should occur.  Please show the CHEMOTHERAPY ALERT CARD or IMMUNOTHERAPY ALERT CARD  at check-in to the Emergency Department and triage nurse.  Should you have questions after your visit or need to cancel or reschedule your appointment, please contact [facility]  Dept: [phone]  and follow the prompts.  Office hours are 8:00 a.m. to 4:30 p.m. Monday - Friday. Please note that voicemails left after 4:00 p.m. may not be returned until the following business day.  We are closed weekends and major holidays. You have access to a nurse at all times for urgent questions. Please call the main number to the clinic Dept: [phone] and follow the prompts.   For any non-urgent questions, you may also contact your provider using MyChart. We now offer e-Visits for anyone 18 and older to request care online for non-urgent symptoms. For details visit [email or web address].   Also download the MyChart app! Go to the app store, search "MyChart", open the app, select [facility], and log in with your MyChart username and password.   

## 2023-04-01 ENCOUNTER — Other Ambulatory Visit: Payer: Self-pay

## 2023-04-01 ENCOUNTER — Ambulatory Visit
Admission: RE | Admit: 2023-04-01 | Discharge: 2023-04-01 | Disposition: A | Discharge status: Home / Self Care | Payer: Medicare Other | Source: Ambulatory Visit | Attending: Hematology and Oncology | Admitting: Hematology and Oncology

## 2023-04-01 ENCOUNTER — Encounter: Payer: Self-pay | Admitting: *Deleted

## 2023-04-01 DIAGNOSIS — Z170 Estrogen receptor positive status [ER+]: Secondary | ICD-10-CM

## 2023-04-01 NOTE — Assessment & Plan Note (Signed)
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  ------------------------------------------------------------------------------------------------------------------------------------------------ Treatment plan: 07/19/2022:Right lumpectomy: Grade 2 IDC, 1 cm with intermediate grade DCIS, ER 95%, PR 90%,, Ki-67 5%, HER2 -, 0/2 lymph nodes negative, anterior margin positive Left lumpectomy: Grade 3 IDC 1.1 cm with DCIS, margins negative, 0/2 lymph nodes negative, ER 100%, PR 100%, HER2 positive 3+, Ki-67 60% Adjuvant chemotherapy with Taxol Herceptin completed 10/29/2022 followed by Herceptin maintenance Adjuvant radiation 12/19/2022-01/16/2023 Adjuvant antiestrogen therapy started 02/19/2023 ------------------------------------------------------------------------  Current treatment: Continue with Herceptin every 3 weeks maintenance therapy until 07/16/2023   Anastrozole toxicities: Breast cancer surveillance: Breast exam 04/02/2023: Benign Mammogram will need to be scheduled RTC every 3 weeks for herceptin and every 6 weeks for labs and MD visit

## 2023-04-02 ENCOUNTER — Inpatient Hospital Stay: Payer: Medicare Other

## 2023-04-02 ENCOUNTER — Encounter: Payer: Self-pay | Admitting: Hematology and Oncology

## 2023-04-02 ENCOUNTER — Inpatient Hospital Stay: Payer: Medicare Other | Attending: Hematology and Oncology

## 2023-04-02 ENCOUNTER — Inpatient Hospital Stay: Payer: Medicare Other | Admitting: Hematology and Oncology

## 2023-04-02 VITALS — BP 129/64 | HR 80 | Temp 97.9°F | Resp 18 | Ht 67.0 in | Wt 229.5 lb

## 2023-04-02 DIAGNOSIS — N6320 Unspecified lump in the left breast, unspecified quadrant: Secondary | ICD-10-CM | POA: Insufficient documentation

## 2023-04-02 DIAGNOSIS — Z881 Allergy status to other antibiotic agents status: Secondary | ICD-10-CM | POA: Diagnosis not present

## 2023-04-02 DIAGNOSIS — Z79899 Other long term (current) drug therapy: Secondary | ICD-10-CM | POA: Diagnosis not present

## 2023-04-02 DIAGNOSIS — D649 Anemia, unspecified: Secondary | ICD-10-CM | POA: Diagnosis not present

## 2023-04-02 DIAGNOSIS — Z170 Estrogen receptor positive status [ER+]: Secondary | ICD-10-CM | POA: Diagnosis not present

## 2023-04-02 DIAGNOSIS — C50412 Malignant neoplasm of upper-outer quadrant of left female breast: Secondary | ICD-10-CM

## 2023-04-02 DIAGNOSIS — Z5112 Encounter for antineoplastic immunotherapy: Secondary | ICD-10-CM | POA: Insufficient documentation

## 2023-04-02 DIAGNOSIS — Z1721 Progesterone receptor positive status: Secondary | ICD-10-CM | POA: Insufficient documentation

## 2023-04-02 DIAGNOSIS — Z79811 Long term (current) use of aromatase inhibitors: Secondary | ICD-10-CM | POA: Insufficient documentation

## 2023-04-02 DIAGNOSIS — Z79620 Long term (current) use of immunosuppressive biologic: Secondary | ICD-10-CM | POA: Insufficient documentation

## 2023-04-02 DIAGNOSIS — Z1731 Human epidermal growth factor receptor 2 positive status: Secondary | ICD-10-CM | POA: Diagnosis not present

## 2023-04-02 DIAGNOSIS — Z95828 Presence of other vascular implants and grafts: Secondary | ICD-10-CM

## 2023-04-02 LAB — CBC WITH DIFFERENTIAL (CANCER CENTER ONLY)
Abs Immature Granulocytes: 0.06 10*3/uL (ref 0.00–0.07)
Basophils Absolute: 0 10*3/uL (ref 0.0–0.1)
Basophils Relative: 1 %
Eosinophils Absolute: 0.1 10*3/uL (ref 0.0–0.5)
Eosinophils Relative: 2 %
HCT: 31.2 % — ABNORMAL LOW (ref 36.0–46.0)
Hemoglobin: 10.3 g/dL — ABNORMAL LOW (ref 12.0–15.0)
Immature Granulocytes: 1 %
Lymphocytes Relative: 19 %
Lymphs Abs: 1.1 10*3/uL (ref 0.7–4.0)
MCH: 26.8 pg (ref 26.0–34.0)
MCHC: 33 g/dL (ref 30.0–36.0)
MCV: 81 fL (ref 80.0–100.0)
Monocytes Absolute: 0.6 10*3/uL (ref 0.1–1.0)
Monocytes Relative: 9 %
Neutro Abs: 4.1 10*3/uL (ref 1.7–7.7)
Neutrophils Relative %: 68 %
Platelet Count: 365 10*3/uL (ref 150–400)
RBC: 3.85 MIL/uL — ABNORMAL LOW (ref 3.87–5.11)
RDW: 16.4 % — ABNORMAL HIGH (ref 11.5–15.5)
WBC Count: 6 10*3/uL (ref 4.0–10.5)
nRBC: 0 % (ref 0.0–0.2)

## 2023-04-02 LAB — CMP (CANCER CENTER ONLY)
ALT: 13 U/L (ref 0–44)
AST: 16 U/L (ref 15–41)
Albumin: 4.1 g/dL (ref 3.5–5.0)
Alkaline Phosphatase: 132 U/L — ABNORMAL HIGH (ref 38–126)
Anion gap: 8 (ref 5–15)
BUN: 15 mg/dL (ref 8–23)
CO2: 28 mmol/L (ref 22–32)
Calcium: 9.9 mg/dL (ref 8.9–10.3)
Chloride: 98 mmol/L (ref 98–111)
Creatinine: 1.09 mg/dL — ABNORMAL HIGH (ref 0.44–1.00)
GFR, Estimated: 56 mL/min — ABNORMAL LOW (ref >60)
Glucose, Bld: 113 mg/dL — ABNORMAL HIGH (ref 70–99)
Potassium: 3.7 mmol/L (ref 3.5–5.1)
Sodium: 134 mmol/L — ABNORMAL LOW (ref 135–145)
Total Bilirubin: 0.4 mg/dL (ref <1.2)
Total Protein: 7.7 g/dL (ref 6.5–8.1)

## 2023-04-02 MED ORDER — SODIUM CHLORIDE 0.9% FLUSH
10.0000 mL | Freq: Once | INTRAVENOUS | Status: AC
  Administered 2023-04-02: 10 mL

## 2023-04-02 MED ORDER — TRASTUZUMAB-ANNS CHEMO 150 MG IV SOLR
600.0000 mg | Freq: Once | INTRAVENOUS | Status: AC
  Administered 2023-04-02: 600 mg via INTRAVENOUS
  Filled 2023-04-02: qty 28.57

## 2023-04-02 MED ORDER — SODIUM CHLORIDE 0.9 % IV SOLN: Freq: Once | INTRAVENOUS | Status: AC

## 2023-04-02 MED ORDER — SODIUM CHLORIDE 0.9% FLUSH
10.0000 mL | INTRAVENOUS | Status: DC | PRN
  Administered 2023-04-02: 10 mL

## 2023-04-02 MED ORDER — DIPHENHYDRAMINE HCL 25 MG PO CAPS
50.0000 mg | ORAL_CAPSULE | Freq: Once | ORAL | Status: AC
  Administered 2023-04-02: 50 mg via ORAL
  Filled 2023-04-02: qty 2

## 2023-04-02 MED ORDER — ACETAMINOPHEN 325 MG PO TABS
650.0000 mg | ORAL_TABLET | Freq: Once | ORAL | Status: AC
  Administered 2023-04-02: 650 mg via ORAL
  Filled 2023-04-02: qty 2

## 2023-04-02 MED ORDER — HEPARIN SOD (PORK) LOCK FLUSH 100 UNIT/ML IV SOLN
500.0000 [IU] | Freq: Once | INTRAVENOUS | Status: AC | PRN
  Administered 2023-04-02: 500 [IU]

## 2023-04-02 NOTE — Progress Notes (Addendum)
  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 (as documented in provider EOT note)  The patient was available for call today.   Symptoms of fatigue have improved since completing therapy.  Symptoms of skin changes have improved since completing therapy.  The patient was encouraged to avoid sun exposure in the area of prior treatment for up to one year following radiation with either sunscreen or by the style of clothing worn in the sun.  The patient has scheduled follow up with her medical oncologist Dr. Pamelia Hoit for ongoing surveillance, and was encouraged to call if she develops concerns or questions regarding radiation.   This concludes the interaction.  Ruel Favors, LPN

## 2023-04-02 NOTE — Patient Instructions (Signed)
 [facility] - A DEPT OF [facility]  Discharge Instructions: Thank you for choosing [facility] to provide your oncology and hematology care.   If you have a lab appointment with the Cancer Center, please go directly to the Cancer Center and check in at the registration area.   Wear comfortable clothing and clothing appropriate for easy access to any Portacath or PICC line.   We strive to give you quality time with your provider. You may need to reschedule your appointment if you arrive late (15 or more minutes).  Arriving late affects you and other patients whose appointments are after yours.  Also, if you miss three or more appointments without notifying the office, you may be dismissed from the clinic at the provider's discretion.      For prescription refill requests, have your pharmacy contact our office and allow 72 hours for refills to be completed.    Today you received the following chemotherapy and/or immunotherapy agents herceptin      To help prevent nausea and vomiting after your treatment, we encourage you to take your nausea medication as directed.  BELOW ARE SYMPTOMS THAT SHOULD BE REPORTED IMMEDIATELY: *FEVER GREATER THAN 100.4 F (38 C) OR HIGHER *CHILLS OR SWEATING *NAUSEA AND VOMITING THAT IS NOT CONTROLLED WITH YOUR NAUSEA MEDICATION *UNUSUAL SHORTNESS OF BREATH *UNUSUAL BRUISING OR BLEEDING *URINARY PROBLEMS (pain or burning when urinating, or frequent urination) *BOWEL PROBLEMS (unusual diarrhea, constipation, pain near the anus) TENDERNESS IN MOUTH AND THROAT WITH OR WITHOUT PRESENCE OF ULCERS (sore throat, sores in mouth, or a toothache) UNUSUAL RASH, SWELLING OR PAIN  UNUSUAL VAGINAL DISCHARGE OR ITCHING   Items with * indicate a potential emergency and should be followed up as soon as possible or go to the Emergency Department if any problems should occur.  Please show the CHEMOTHERAPY ALERT CARD or IMMUNOTHERAPY  ALERT CARD at check-in to the Emergency Department and triage nurse.  Should you have questions after your visit or need to cancel or reschedule your appointment, please contact [facility] - A DEPT OF [facility]  Dept: [phone]  and follow the prompts.  Office hours are 8:00 a.m. to 4:30 p.m. Monday - Friday. Please note that voicemails left after 4:00 p.m. may not be returned until the following business day.  We are closed weekends and major holidays. You have access to a nurse at all times for urgent questions. Please call the main number to the clinic Dept: [phone] and follow the prompts.   For any non-urgent questions, you may also contact your provider using MyChart. We now offer e-Visits for anyone 29 and older to request care online for non-urgent symptoms. For details visit [email or web address].   Also download the MyChart app! Go to the app store, search "MyChart", open the app, select [place], and log in with your MyChart username and password.

## 2023-04-02 NOTE — Progress Notes (Signed)
Dose adjusted to 600mg  (~10mg /kg) per Dr. Pamelia Hoit.

## 2023-04-02 NOTE — Progress Notes (Signed)
Patient Care Team: [name], MD as PCP - General (Internal Medicine) [name], MD as Consulting Physician (Hematology and Oncology) Almond Lint, MD as Consulting Physician (General Surgery)  DIAGNOSIS:  Encounter Diagnosis  Name Primary?   Malignant neoplasm of upper-outer quadrant of left breast in female, estrogen receptor positive (HCC) Yes    SUMMARY OF ONCOLOGIC HISTORY: Oncology History  Malignant neoplasm of upper-outer quadrant of left breast in female, estrogen receptor positive (HCC)  04/30/2022 Initial Diagnosis   Screening mammogram to treat left breast mass measuring 0.7 cm, adjacent mass 0.5 cm, biopsy of both revealed grade 3 IDC with DCIS ER 100% PR 100% Ki-67 60%, HER2 3+ positive   05/25/2022 Cancer Staging   Staging form: Breast, AJCC 8th Edition - Clinical: Stage IA (cT1b, cN0, cM0, G3, ER+, PR+, HER2+) - Signed by [name], MD on 05/25/2022 Stage prefix: Initial diagnosis Histologic grading system: 3 grade system   07/19/2022 Surgery   Right lumpectomy: Grade 2 IDC, 1 cm with intermediate grade DCIS, ER 95%, PR 90%,, Ki-67 5%, HER2 -, 0/2 lymph nodes negative, anterior margin positive Left lumpectomy: Grade 3 IDC 1.1 cm with DCIS, margins negative, 0/2 lymph nodes negative, ER 100%, PR 100%, HER2 positive 3+, Ki-67 60%   07/29/2022 Genetic Testing   Negative genetic testing on the CancerNext-Expanded+RNAinsight panel.  The report date is July 29, 2022.  The CancerNext-Expanded gene panel offered by [facility] and includes sequencing and rearrangement analysis for the following 77 genes: AIP, ALK, APC*, ATM*, AXIN2, BAP1, BARD1, BMPR1A, BRCA1*, BRCA2*, BRIP1*, CDC73, CDH1*, CDK4, CDKN1B, CDKN2A, CHEK2*, CTNNA1, DICER1, FH, FLCN, KIF1B, LZTR1, MAX, MEN1, MET, MLH1*, MSH2*, MSH3, MSH6*, MUTYH*, NF1*, NF2, NTHL1, PALB2*, PHOX2B, PMS2*, POT1, PRKAR1A, PTCH1, PTEN*, RAD51C*, RAD51D*, RB1, RET, SDHA, SDHAF2, SDHB, SDHC, SDHD, SMAD4, SMARCA4, SMARCB1,  SMARCE1, STK11, SUFU, TMEM127, TP53*, TSC1, TSC2, and VHL (sequencing and deletion/duplication); EGFR, EGLN1, HOXB13, KIT, MITF, PDGFRA, POLD1, and POLE (sequencing only); EPCAM and GREM1 (deletion/duplication only). DNA and RNA analyses performed for * genes.    08/14/2022 -  Chemotherapy   Patient is on Treatment Plan : BREAST Paclitaxel + Trastuzumab q7d / Trastuzumab q21d       CHIEF COMPLIANT: Follow-up on Herceptin  HISTORY OF PRESENT ILLNESS:   History of Present Illness   The patient, with a history of breast cancer, is currently undergoing Herceptin treatment. She reports feeling well overall and has returned to full-time work. She has not experienced any side effects from the anastrozole pill, such as hot flashes. The patient's hair has started to grow back and she has dyed it pink, indicating a positive outlook and playful spirit.  The most recent echocardiogram results were satisfactory. The patient's blood work shows a slight anemia, with a hemoglobin level of 10.3 (normal is 12), but this is an improvement from previous levels.         ALLERGIES:  is allergic to flagyl [metronidazole].  MEDICATIONS:  Current Outpatient Medications  Medication Sig Dispense Refill   anastrozole (ARIMIDEX) 1 MG tablet Take 1 tablet (1 mg total) by mouth daily. 90 tablet 3   aspirin EC 81 MG tablet Take 81 mg by mouth daily. Swallow whole.     atenolol (TENORMIN) 50 MG tablet Take 50 mg by mouth every evening.     chlorhexidine (PERIDEX) 0.12 % solution 15 mLs 2 (two) times daily.     DULoxetine (CYMBALTA) 60 MG capsule Take 60 mg by mouth every evening.     esomeprazole (  NEXIUM) 40 MG capsule Take 40 mg by mouth daily at 12 noon.     lidocaine (XYLOCAINE) 2 % solution 15 mLs.     lidocaine-prilocaine (EMLA) cream APPLY TO AFFECTED AREA ONCE AS DIRECTED 30 g 0   lisinopril-hydrochlorothiazide (ZESTORETIC) 20-25 MG tablet Take 1 tablet by mouth daily.     magic mouthwash (lidocaine,  diphenhydrAMINE, alum & mag hydroxide) suspension Swish and swallow 5 mls by mouth 4 times a day as needed for mouth pain 240 mL 2   magic mouthwash w/lidocaine SOLN Take 5 mLs by mouth 4 (four) times daily as needed for mouth pain. 240 mL 2   Multiple Vitamin (MULTIVITAMIN WITH MINERALS) TABS tablet Take 1 tablet by mouth every evening.     terbinafine (LAMISIL) 250 MG tablet Take 1 tablet (250 mg total) by mouth daily. 30 tablet 3   No current facility-administered medications for this visit.    PHYSICAL EXAMINATION: ECOG PERFORMANCE STATUS: 1 - Symptomatic but completely ambulatory  Vitals:   04/02/23 1023  BP: 129/64  Pulse: 80  Resp: 18  Temp: 97.9 F (36.6 C)  SpO2: 98%   Filed Weights   04/02/23 1023  Weight: 229 lb 8 oz (104.1 kg)      LABORATORY DATA:  I have reviewed the data as listed    Latest Ref Rng & Units 03/12/2023    8:33 AM 02/19/2023   10:38 AM 01/07/2023   11:19 AM  CMP  Glucose 70 - 99 mg/dL 98  811  91   BUN 8 - 23 mg/dL 14  18  12    Creatinine 0.44 - 1.00 mg/dL 9.14  7.82  9.56   Sodium 135 - 145 mmol/L 138  137  133   Potassium 3.5 - 5.1 mmol/L 3.6  3.5  4.1   Chloride 98 - 111 mmol/L 102  102  99   CO2 22 - 32 mmol/L 30  27  28    Calcium 8.9 - 10.3 mg/dL 9.7  9.8  9.5   Total Protein 6.5 - 8.1 g/dL 7.5  7.7  7.5   Total Bilirubin 0.3 - 1.2 mg/dL 0.4  0.4  0.4   Alkaline Phos 38 - 126 U/L 107  97  110   AST 15 - 41 U/L 13  18  15    ALT 0 - 44 U/L 11  14  12      Lab Results  Component Value Date   WBC 6.0 04/02/2023   HGB 10.3 (L) 04/02/2023   HCT 31.2 (L) 04/02/2023   MCV 81.0 04/02/2023   PLT 365 04/02/2023   NEUTROABS 4.1 04/02/2023    ASSESSMENT & PLAN:  Malignant neoplasm of upper-outer quadrant of left breast in female, 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   ------------------------------------------------------------------------------------------------------------------------------------------------ Treatment plan: 07/19/2022:Right lumpectomy: Grade 2 IDC, 1 cm with intermediate grade DCIS, ER 95%, PR 90%,, Ki-67 5%, HER2 -, 0/2 lymph nodes negative, anterior margin positive Left lumpectomy: Grade 3 IDC 1.1 cm with DCIS, margins negative, 0/2 lymph nodes negative, ER 100%, PR 100%, HER2 positive 3+, Ki-67 60% Adjuvant chemotherapy with Taxol Herceptin completed 10/29/2022 followed by Herceptin maintenance Adjuvant radiation 12/19/2022-01/16/2023 Adjuvant antiestrogen therapy started 02/19/2023 ------------------------------------------------------------------------  Current treatment: Continue with Herceptin every 3 weeks maintenance therapy until 07/16/2023   Anastrozole toxicities: Tolerating extremely well without any side effects.  Denies any hot flashes  or arthralgias or myalgias. She is now working 9 hours a day 5 days a week full-time.    -Order echocardiogram for January 2025 to monitor cardiac function.  Mild Anemia Hemoglobin improving but still slightly below normal range. -Continue to monitor hemoglobin levels. -Next treatment on 04/23/2023 and follow-up appointment on 05/14/2023.  General Health Maintenance -Last Herceptin treatment scheduled for 07/16/2023.      Return to clinic every 3 weeks for Herceptin every 6 weeks with follow-up with [place].    Orders Placed This Encounter  Procedures   ECHOCARDIOGRAM COMPLETE    Standing Status:   Future    Standing Expiration Date:   04/01/2024    Order Specific Question:   Where should this test be performed    Answer:   [facility]    Order Specific Question:   Perflutren DEFINITY (image enhancing agent) should be administered unless hypersensitivity or allergy exist    Answer:   Administer Perflutren    Order Specific Question:   Reason for exam-Echo    Answer:   Chemo  Z09    Order  Specific Question:   Release to patient    Answer:   Immediate   The patient has a good understanding of the overall plan. she agrees with it. she will call with any problems that may develop before the next visit here. Total time spent: 30 mins including face to face time and time spent for planning, charting and co-ordination of care   Tamsen Meek, MD 04/02/23

## 2023-04-16 ENCOUNTER — Other Ambulatory Visit: Payer: Self-pay | Admitting: Hematology and Oncology

## 2023-04-23 ENCOUNTER — Inpatient Hospital Stay: Payer: Medicare Other | Attending: Hematology and Oncology

## 2023-04-23 ENCOUNTER — Inpatient Hospital Stay: Payer: Medicare Other

## 2023-04-23 VITALS — BP 112/60 | HR 64 | Temp 97.6°F | Resp 18

## 2023-04-23 DIAGNOSIS — T451X5A Adverse effect of antineoplastic and immunosuppressive drugs, initial encounter: Secondary | ICD-10-CM | POA: Insufficient documentation

## 2023-04-23 DIAGNOSIS — Z8051 Family history of malignant neoplasm of kidney: Secondary | ICD-10-CM | POA: Insufficient documentation

## 2023-04-23 DIAGNOSIS — Z800 Family history of malignant neoplasm of digestive organs: Secondary | ICD-10-CM | POA: Insufficient documentation

## 2023-04-23 DIAGNOSIS — Z79620 Long term (current) use of immunosuppressive biologic: Secondary | ICD-10-CM | POA: Diagnosis not present

## 2023-04-23 DIAGNOSIS — C50412 Malignant neoplasm of upper-outer quadrant of left female breast: Secondary | ICD-10-CM | POA: Diagnosis not present

## 2023-04-23 DIAGNOSIS — G620 Drug-induced polyneuropathy: Secondary | ICD-10-CM | POA: Diagnosis not present

## 2023-04-23 DIAGNOSIS — Z803 Family history of malignant neoplasm of breast: Secondary | ICD-10-CM | POA: Insufficient documentation

## 2023-04-23 DIAGNOSIS — Z5112 Encounter for antineoplastic immunotherapy: Secondary | ICD-10-CM | POA: Insufficient documentation

## 2023-04-23 DIAGNOSIS — Z95828 Presence of other vascular implants and grafts: Secondary | ICD-10-CM

## 2023-04-23 DIAGNOSIS — Z170 Estrogen receptor positive status [ER+]: Secondary | ICD-10-CM

## 2023-04-23 DIAGNOSIS — I10 Essential (primary) hypertension: Secondary | ICD-10-CM | POA: Insufficient documentation

## 2023-04-23 MED ORDER — HEPARIN SOD (PORK) LOCK FLUSH 100 UNIT/ML IV SOLN
500.0000 [IU] | Freq: Once | INTRAVENOUS | Status: AC | PRN
Start: 2023-04-23 — End: 2023-04-23
  Administered 2023-04-23: 500 [IU]

## 2023-04-23 MED ORDER — ACETAMINOPHEN 325 MG PO TABS
650.0000 mg | ORAL_TABLET | Freq: Once | ORAL | Status: AC
  Administered 2023-04-23: 650 mg via ORAL
  Filled 2023-04-23: qty 2

## 2023-04-23 MED ORDER — SODIUM CHLORIDE 0.9% FLUSH
10.0000 mL | INTRAVENOUS | Status: DC | PRN
  Administered 2023-04-23: 10 mL

## 2023-04-23 MED ORDER — DIPHENHYDRAMINE HCL 25 MG PO CAPS
50.0000 mg | ORAL_CAPSULE | Freq: Once | ORAL | Status: AC
  Administered 2023-04-23: 50 mg via ORAL
  Filled 2023-04-23: qty 2

## 2023-04-23 MED ORDER — TRASTUZUMAB-ANNS CHEMO 150 MG IV SOLR
600.0000 mg | Freq: Once | INTRAVENOUS | Status: AC
  Administered 2023-04-23: 600 mg via INTRAVENOUS
  Filled 2023-04-23: qty 28.57

## 2023-04-23 MED ORDER — SODIUM CHLORIDE 0.9 % IV SOLN
Freq: Once | INTRAVENOUS | Status: AC
Start: 2023-04-23 — End: 2023-04-23

## 2023-04-23 MED ORDER — SODIUM CHLORIDE 0.9% FLUSH
10.0000 mL | Freq: Once | INTRAVENOUS | Status: AC
  Administered 2023-04-23: 10 mL

## 2023-04-23 NOTE — Patient Instructions (Signed)
 [facility] - A DEPT [facility]  Discharge Instructions: Thank you for choosing [facility] to provide your oncology and hematology care.   If you have a lab appointment with the Cancer Center, please go directly to the Cancer Center and check in at the registration area.   Wear comfortable clothing and clothing appropriate for easy access to any Portacath or PICC line.   We strive to give you quality time with your provider. You may need to reschedule your appointment if you arrive late (15 or more minutes).  Arriving late affects you and other patients whose appointments are after yours.  Also, if you miss three or more appointments without notifying the office, you may be dismissed from the clinic at the provider's discretion.      For prescription refill requests, have your pharmacy contact our office and allow 72 hours for refills to be completed.    Today you received the following chemotherapy and/or immunotherapy agents Kan[place]inti      To help prevent nausea and vomiting after your treatment, we encourage you to take your nausea medication as directed.  BELOW ARE SYMPTOMS THAT SHOULD BE REPORTED IMMEDIATELY: *FEVER GREATER THAN 100.4 F (38 C) OR HIGHER *CHILLS OR SWEATING *NAUSEA AND VOMITING THAT IS NOT CONTROLLED WITH YOUR NAUSEA MEDICATION *UNUSUAL SHORTNESS OF BREATH *UNUSUAL BRUISING OR BLEEDING *URINARY PROBLEMS (pain or burning when urinating, or frequent urination) *BOWEL PROBLEMS (unusual diarrhea, constipation, pain near the anus) TENDERNESS IN MOUTH AND THROAT WITH OR WITHOUT PRESENCE OF ULCERS (sore throat, sores in mouth, or a toothache) UNUSUAL RASH, SWELLING OR PAIN  UNUSUAL VAGINAL DISCHARGE OR ITCHING   Items with * indicate a potential emergency and should be followed up as soon as possible or go to the Emergency Department if any problems should occur.  Please show the CHEMOTHERAPY ALERT CARD or IMMUNOTHERAPY  ALERT CARD at check-in to the Emergency Department and triage nurse.  Should you have questions after your visit or need to cancel or reschedule your appointment, please contact [facility] - [facility]  Dept: [phone]  and follow the prompts.  Office hours are 8:00 a.m. to 4:30 p.m. Monday - Friday. Please note that voicemails left after 4:00 p.m. may not be returned until the following business day.  We are closed weekends and major holidays. You have access to a nurse at all times for urgent questions. Please call the main number to the clinic Dept: [phone] and follow the prompts.   For any non-urgent questions, you may also contact your provider using MyChart. We now offer e-Visits for anyone 69 and older to request care online for non-urgent symptoms. For details visit [email or web address].   Also download the MyChart app! Go to the app store, search "MyChart", open the app, select [place], and log in with your MyChart username and password.

## 2023-05-13 ENCOUNTER — Encounter: Payer: Self-pay | Admitting: Hematology and Oncology

## 2023-05-13 ENCOUNTER — Other Ambulatory Visit: Payer: Self-pay

## 2023-05-13 DIAGNOSIS — Z170 Estrogen receptor positive status [ER+]: Secondary | ICD-10-CM

## 2023-05-14 ENCOUNTER — Encounter: Payer: Self-pay | Admitting: Adult Health

## 2023-05-14 ENCOUNTER — Inpatient Hospital Stay: Payer: Medicare Other

## 2023-05-14 ENCOUNTER — Inpatient Hospital Stay (HOSPITAL_BASED_OUTPATIENT_CLINIC_OR_DEPARTMENT_OTHER): Payer: Medicare Other | Admitting: Adult Health

## 2023-05-14 VITALS — BP 140/67 | HR 61 | Temp 97.7°F | Resp 18 | Ht 67.0 in | Wt 234.6 lb

## 2023-05-14 DIAGNOSIS — Z95828 Presence of other vascular implants and grafts: Secondary | ICD-10-CM

## 2023-05-14 DIAGNOSIS — Z5112 Encounter for antineoplastic immunotherapy: Secondary | ICD-10-CM | POA: Diagnosis not present

## 2023-05-14 DIAGNOSIS — Z170 Estrogen receptor positive status [ER+]: Secondary | ICD-10-CM

## 2023-05-14 DIAGNOSIS — Z803 Family history of malignant neoplasm of breast: Secondary | ICD-10-CM | POA: Diagnosis not present

## 2023-05-14 DIAGNOSIS — C50412 Malignant neoplasm of upper-outer quadrant of left female breast: Secondary | ICD-10-CM | POA: Diagnosis not present

## 2023-05-14 DIAGNOSIS — Z79620 Long term (current) use of immunosuppressive biologic: Secondary | ICD-10-CM | POA: Diagnosis not present

## 2023-05-14 DIAGNOSIS — G620 Drug-induced polyneuropathy: Secondary | ICD-10-CM | POA: Diagnosis not present

## 2023-05-14 DIAGNOSIS — Z79811 Long term (current) use of aromatase inhibitors: Secondary | ICD-10-CM | POA: Diagnosis not present

## 2023-05-14 DIAGNOSIS — T451X5A Adverse effect of antineoplastic and immunosuppressive drugs, initial encounter: Secondary | ICD-10-CM | POA: Diagnosis not present

## 2023-05-14 DIAGNOSIS — Z800 Family history of malignant neoplasm of digestive organs: Secondary | ICD-10-CM | POA: Diagnosis not present

## 2023-05-14 DIAGNOSIS — Z8051 Family history of malignant neoplasm of kidney: Secondary | ICD-10-CM | POA: Diagnosis not present

## 2023-05-14 DIAGNOSIS — I10 Essential (primary) hypertension: Secondary | ICD-10-CM | POA: Diagnosis not present

## 2023-05-14 LAB — CBC WITH DIFFERENTIAL (CANCER CENTER ONLY)
Abs Immature Granulocytes: 0.01 10*3/uL (ref 0.00–0.07)
Basophils Absolute: 0 10*3/uL (ref 0.0–0.1)
Basophils Relative: 1 %
Eosinophils Absolute: 0.1 10*3/uL (ref 0.0–0.5)
Eosinophils Relative: 3 %
HCT: 30.1 % — ABNORMAL LOW (ref 36.0–46.0)
Hemoglobin: 9.9 g/dL — ABNORMAL LOW (ref 12.0–15.0)
Immature Granulocytes: 0 %
Lymphocytes Relative: 23 %
Lymphs Abs: 1.2 10*3/uL (ref 0.7–4.0)
MCH: 27.4 pg (ref 26.0–34.0)
MCHC: 32.9 g/dL (ref 30.0–36.0)
MCV: 83.4 fL (ref 80.0–100.0)
Monocytes Absolute: 0.4 10*3/uL (ref 0.1–1.0)
Monocytes Relative: 8 %
Neutro Abs: 3.4 10*3/uL (ref 1.7–7.7)
Neutrophils Relative %: 65 %
Platelet Count: 317 10*3/uL (ref 150–400)
RBC: 3.61 MIL/uL — ABNORMAL LOW (ref 3.87–5.11)
RDW: 15.8 % — ABNORMAL HIGH (ref 11.5–15.5)
WBC Count: 5.2 10*3/uL (ref 4.0–10.5)
nRBC: 0 % (ref 0.0–0.2)

## 2023-05-14 LAB — CMP (CANCER CENTER ONLY)
ALT: 9 U/L (ref 0–44)
AST: 13 U/L — ABNORMAL LOW (ref 15–41)
Albumin: 4.1 g/dL (ref 3.5–5.0)
Alkaline Phosphatase: 101 U/L (ref 38–126)
Anion gap: 8 (ref 5–15)
BUN: 20 mg/dL (ref 8–23)
CO2: 27 mmol/L (ref 22–32)
Calcium: 9.6 mg/dL (ref 8.9–10.3)
Chloride: 101 mmol/L (ref 98–111)
Creatinine: 1.15 mg/dL — ABNORMAL HIGH (ref 0.44–1.00)
GFR, Estimated: 52 mL/min — ABNORMAL LOW (ref >60)
Glucose, Bld: 106 mg/dL — ABNORMAL HIGH (ref 70–99)
Potassium: 3.6 mmol/L (ref 3.5–5.1)
Sodium: 136 mmol/L (ref 135–145)
Total Bilirubin: 0.5 mg/dL (ref 0.0–1.2)
Total Protein: 7.3 g/dL (ref 6.5–8.1)

## 2023-05-14 MED ORDER — DIPHENHYDRAMINE HCL 25 MG PO CAPS
50.0000 mg | ORAL_CAPSULE | Freq: Once | ORAL | Status: AC
  Administered 2023-05-14: 50 mg via ORAL
  Filled 2023-05-14: qty 2

## 2023-05-14 MED ORDER — SODIUM CHLORIDE 0.9% FLUSH
10.0000 mL | INTRAVENOUS | Status: DC | PRN
Start: 2023-05-14 — End: 2023-05-14
  Administered 2023-05-14: 10 mL

## 2023-05-14 MED ORDER — ACETAMINOPHEN 325 MG PO TABS
650.0000 mg | ORAL_TABLET | Freq: Once | ORAL | Status: AC
  Administered 2023-05-14: 650 mg via ORAL
  Filled 2023-05-14: qty 2

## 2023-05-14 MED ORDER — HEPARIN SOD (PORK) LOCK FLUSH 100 UNIT/ML IV SOLN
500.0000 [IU] | Freq: Once | INTRAVENOUS | Status: AC | PRN
  Administered 2023-05-14: 500 [IU]

## 2023-05-14 MED ORDER — TRASTUZUMAB-ANNS CHEMO 150 MG IV SOLR
600.0000 mg | Freq: Once | INTRAVENOUS | Status: AC
  Administered 2023-05-14: 600 mg via INTRAVENOUS
  Filled 2023-05-14: qty 28.57

## 2023-05-14 MED ORDER — SODIUM CHLORIDE 0.9% FLUSH
10.0000 mL | Freq: Once | INTRAVENOUS | Status: AC
  Administered 2023-05-14: 10 mL

## 2023-05-14 MED ORDER — SODIUM CHLORIDE 0.9 % IV SOLN: Freq: Once | INTRAVENOUS | Status: AC

## 2023-05-14 NOTE — Progress Notes (Signed)
 [facility] Cancer Follow up:    Christy Leta NOVAK, MD [address] [name] [address]   DIAGNOSIS:  Cancer Staging  Malignant neoplasm of upper-outer quadrant of left breast in female, estrogen receptor positive (HCC) Staging form: Breast, AJCC 8th Edition - Clinical: Stage IA (cT1b, cN0, cM0, G3, ER+, PR+, HER2+) - Signed by Odean Potts, MD on 05/25/2022 Stage prefix: Initial diagnosis Histologic grading system: 3 grade system   SUMMARY OF ONCOLOGIC HISTORY: Oncology History  Malignant neoplasm of upper-outer quadrant of left breast in female, estrogen receptor positive (HCC)  04/30/2022 Initial Diagnosis   Screening mammogram to treat left breast mass measuring 0.7 cm, adjacent mass 0.5 cm, biopsy of both revealed grade 3 IDC with DCIS ER 100% PR 100% Ki-67 60%, HER2 3+ positive   05/25/2022 Cancer Staging   Staging form: Breast, AJCC 8th Edition - Clinical: Stage IA (cT1b, cN0, cM0, G3, ER+, PR+, HER2+) - Signed by Odean Potts, MD on 05/25/2022 Stage prefix: Initial diagnosis Histologic grading system: 3 grade system   07/19/2022 Surgery   Right lumpectomy: Grade 2 IDC, 1 cm with intermediate grade DCIS, ER 95%, PR 90%,, Ki-67 5%, HER2 -, 0/2 lymph nodes negative, anterior margin positive Left lumpectomy: Grade 3 IDC 1.1 cm with DCIS, margins negative, 0/2 lymph nodes negative, ER 100%, PR 100%, HER2 positive 3+, Ki-67 60%   07/29/2022 Genetic Testing   Negative genetic testing on the CancerNext-Expanded+RNAinsight panel.  The report date is July 29, 2022.  The CancerNext-Expanded gene panel offered by [facility] and includes sequencing and rearrangement analysis for the following 77 genes: AIP, ALK, APC*, ATM*, AXIN2, BAP1, BARD1, BMPR1A, BRCA1*, BRCA2*, BRIP1*, CDC73, CDH1*, CDK4, CDKN1B, CDKN2A, CHEK2*, CTNNA1, DICER1, FH, FLCN, KIF1B, LZTR1, MAX, MEN1, MET, MLH1*, MSH2*, MSH3, MSH6*, MUTYH*, NF1*, NF2, NTHL1, PALB2*, PHOX2B, PMS2*, POT1, PRKAR1A, PTCH1, PTEN*,  RAD51C*, RAD51D*, RB1, RET, SDHA, SDHAF2, SDHB, SDHC, SDHD, SMAD4, SMARCA4, SMARCB1, SMARCE1, STK11, SUFU, TMEM127, TP53*, TSC1, TSC2, and VHL (sequencing and deletion/duplication); EGFR, EGLN1, HOXB13, KIT, MITF, PDGFRA, POLD1, and POLE (sequencing only); EPCAM and GREM1 (deletion/duplication only). DNA and RNA analyses performed for * genes.    08/14/2022 -  Chemotherapy   Patient is on Treatment Plan : BREAST Paclitaxel  + Trastuzumab  q7d / Trastuzumab  q21d     12/18/2022 - 01/16/2023 Radiation Therapy   First Treatment Date: 2022-12-18 - Last Treatment Date: 2023-01-16   Plan Name: Breast_L Site: Breast, Left Technique: 3D Mode: Photon Dose Per Fraction: 2.66 Gy Prescribed Dose (Delivered / Prescribed): 42.56 Gy / 42.56 Gy Prescribed Fxs (Delivered / Prescribed): 16 / 16   Plan Name: Breast_L_Bst Site: Breast, Left Technique: 3D Mode: Photon Dose Per Fraction: 2 Gy Prescribed Dose (Delivered / Prescribed): 8 Gy / 8 Gy Prescribed Fxs (Delivered / Prescribed): 4 / 4   Plan Name: Breast_R Site: Breast, Right Technique: 3D Mode: Photon Dose Per Fraction: 2.66 Gy Prescribed Dose (Delivered / Prescribed): 42.56 Gy / 42.56 Gy Prescribed Fxs (Delivered / Prescribed): 16 / 16   Plan Name: Breast_R_Bst Site: Breast, Right Technique: 3D Mode: Photon Dose Per Fraction: 2 Gy Prescribed Dose (Delivered / Prescribed): 8 Gy / 8 Gy Prescribed Fxs (Delivered / Prescribed): 4 / 4   02/2023 -  Anti-estrogen oral therapy   Anastrozole  daily     CURRENT THERAPY: Herceptin /Anastrozole   INTERVAL HISTORY:  Discussed the use of AI scribe software for clinical note transcription with the patient, who gave verbal consent to proceed.  Christy  C Lynch 67 y.o. female  currently undergoing Herceptin  treatment every three weeks for breast cancer, reports significant joint aches and pains that have developed since starting anastrozole  in October. The discomfort is particularly noticeable after  periods of sitting, causing difficulty in standing and necessitating a pause to regain composure before moving. The joint pain, coupled with swelling, particularly in the feet and ankles, has been disruptive to sleep. The patient is unsure if the swelling is a result of the neuropathy or the medication.  The patient also reports numbness and tingling in the fingertips, toes, and feet, which rarely impacts balance. Fine motor skills such as tearing open packages or writing have been mildly affected, requiring more time and effort. The patient experiences occasional pain in the toes at night or when the sheets make contact.  The patient also reports severe constipation, with bowel movements occurring only once a week and often accompanied by abdominal and back pain. Despite a high water intake, the patient struggles with the sensation of needing to go without being able to.  The patient has been working nine hours a day, five days a week, with walking at work being the primary form of exercise. The patient has also been experiencing some fluid accumulation under the arms since undergoing radiation therapy for breast cancer. The patient's job has been supportive throughout the treatment process, providing a strong support system.   Patient Active Problem List   Diagnosis Date Noted   Chemotherapy-induced peripheral neuropathy (HCC) 10/15/2022   Port-A-Cath in place 08/14/2022   Genetic testing 07/30/2022   Family history of breast cancer 07/18/2022   Family history of colon cancer 07/18/2022   Family history of kidney cancer 07/18/2022   Malignant neoplasm of upper-outer quadrant of left breast in female, estrogen receptor positive (HCC) 05/22/2022   Unilateral primary osteoarthritis, left knee 10/14/2019   Vertigo 08/21/2013   Chest pain 07/02/2013   Essential hypertension 07/02/2013   Hyperlipidemia 07/02/2013    is allergic to flagyl  [metronidazole ].  MEDICAL HISTORY: Past Medical  History:  Diagnosis Date   Breast Cancer    Left   Bursitis of hip    Esophageal reflux    Family history of breast cancer    Family history of colon cancer    Family history of kidney cancer    Fibromyalgia    Hypertension    Other and unspecified hyperlipidemia     SURGICAL HISTORY: Past Surgical History:  Procedure Laterality Date   ABDOMINAL HYSTERECTOMY     BREAST BIOPSY Left    neg bx early 2000's   BREAST BIOPSY Left 04/30/2022   US  LT BREAST BX W LOC DEV 1ST LESION IMG BX SPEC US  GUIDE 04/30/2022 [facility]   BREAST BIOPSY Left 04/30/2022   US  LT BREAST BX W LOC DEV EA ADD LESION IMG BX SPEC US  GUIDE 04/30/2022 [facility]   BREAST BIOPSY  07/17/2022   MM LT RADIOACTIVE SEED LOC MAMMO GUIDE 07/17/2022 [facility]   BREAST BIOPSY  07/17/2022   MM LT RADIOACTIVE SEED EA ADD LESION LOC MAMMO GUIDE 07/17/2022 [facility]   BREAST BIOPSY  07/17/2022   MM RT RADIOACTIVE SEED LOC MAMMO GUIDE 07/17/2022 [facility]   BREAST LUMPECTOMY WITH RADIOACTIVE SEED AND SENTINEL LYMPH NODE BIOPSY Bilateral 07/19/2022   Procedure: BILATERAL BREAST LUMPECTOMY WITH RADIOACTIVE SEED AND BILATERAL SENTINEL LYMPH NODE BIOPSY;  Surgeon: Aron Shoulders, MD;  Location: [facility];  Service: General;  Laterality: Bilateral;   LEFT HEART CATH  12/12/2006   PORTACATH PLACEMENT  Left 07/19/2022   Procedure: INSERTION PORT-A-CATH;  Surgeon: Aron Shoulders, MD;  Location: [facility];  Service: General;  Laterality: Left;    SOCIAL HISTORY: Social History   Socioeconomic History   Marital status: Married    Spouse name: Not on file   Number of children: Not on file   Years of education: Not on file   Highest education level: Not on file  Occupational History   Not on file  Tobacco Use   Smoking status: Former   Smokeless tobacco: Never  Vaping Use   Vaping status: Never Used  Substance and Sexual Activity   Alcohol use: No   Drug use: No    Sexual activity: Not on file  Other Topics Concern   Not on file  Social History Narrative   Not on file   Social Drivers of Health   Financial Resource Strain: Not on file  Food Insecurity: No Food Insecurity (10/23/2022)   Hunger Vital Sign    Worried About Running Out of Food in the Last Year: Never true    Ran Out of Food in the Last Year: Never true  Transportation Needs: No Transportation Needs (10/23/2022)   PRAPARE - Administrator, Civil Service (Medical): No    Lack of Transportation (Non-Medical): No  Physical Activity: Not on file  Stress: Not on file  Social Connections: Not on file  Intimate Partner Violence: Not At Risk (10/23/2022)   Humiliation, Afraid, Rape, and Kick questionnaire    Fear of Current or Ex-Partner: No    Emotionally Abused: No    Physically Abused: No    Sexually Abused: No    FAMILY HISTORY: Family History  Problem Relation Age of Onset   Breast cancer Mother 3   Hypertension Mother    Stroke Mother    Kidney disease Father    Kidney cancer Brother 24   Colon cancer Maternal Uncle 20   Breast cancer Cousin        maternal first cousin, dx 30s    Review of Systems  Constitutional:  Negative for appetite change, chills, fatigue, fever and unexpected weight change.  HENT:   Negative for hearing loss, lump/mass and trouble swallowing.   Eyes:  Negative for eye problems and icterus.  Respiratory:  Negative for chest tightness, cough and shortness of breath.   Cardiovascular:  Negative for chest pain, leg swelling and palpitations.  Gastrointestinal:  Negative for abdominal distention, abdominal pain, constipation, diarrhea, nausea and vomiting.  Endocrine: Negative for hot flashes.  Genitourinary:  Negative for difficulty urinating.   Musculoskeletal:  Negative for arthralgias.  Skin:  Negative for itching and rash.  Neurological:  Negative for dizziness, extremity weakness, headaches and numbness.  Hematological:  Negative  for adenopathy. Does not bruise/bleed easily.  Psychiatric/Behavioral:  Negative for depression. The patient is not nervous/anxious.       PHYSICAL EXAMINATION     Vitals:   05/14/23 1004  BP: (!) 140/67  Pulse: 61  Resp: 18  Temp: 97.7 F (36.5 C)  SpO2: 96%    Physical Exam Constitutional:      General: She is not in acute distress.    Appearance: Normal appearance. She is not toxic-appearing.  HENT:     Head: Normocephalic and atraumatic.     Mouth/Throat:     Mouth: Mucous membranes are moist.     Pharynx: Oropharynx is clear. No oropharyngeal exudate or posterior oropharyngeal erythema.  Eyes:  General: No scleral icterus. Cardiovascular:     Rate and Rhythm: Normal rate and regular rhythm.     Pulses: Normal pulses.     Heart sounds: Normal heart sounds.  Pulmonary:     Effort: Pulmonary effort is normal.     Breath sounds: Normal breath sounds.  Abdominal:     General: Abdomen is flat. Bowel sounds are normal. There is no distension.     Palpations: Abdomen is soft.     Tenderness: There is no abdominal tenderness.  Musculoskeletal:        General: No swelling.     Cervical back: Neck supple.  Lymphadenopathy:     Cervical: No cervical adenopathy.  Skin:    General: Skin is warm and dry.     Findings: No rash.  Neurological:     General: No focal deficit present.     Mental Status: She is alert.  Psychiatric:        Mood and Affect: Mood normal.        Behavior: Behavior normal.     LABORATORY DATA:  CBC    Component Value Date/Time   WBC 5.2 05/14/2023 0922   WBC 6.9 06/11/2022 0840   RBC 3.61 (L) 05/14/2023 0922   HGB 9.9 (L) 05/14/2023 0922   HCT 30.1 (L) 05/14/2023 0922   PLT 317 05/14/2023 0922   MCV 83.4 05/14/2023 0922   MCH 27.4 05/14/2023 0922   MCHC 32.9 05/14/2023 0922   RDW 15.8 (H) 05/14/2023 0922   LYMPHSABS 1.2 05/14/2023 0922   MONOABS 0.4 05/14/2023 0922   EOSABS 0.1 05/14/2023 0922   BASOSABS 0.0 05/14/2023 0922     CMP     Component Value Date/Time   NA 136 05/14/2023 0922   K 3.6 05/14/2023 0922   CL 101 05/14/2023 0922   CO2 27 05/14/2023 0922   GLUCOSE 106 (H) 05/14/2023 0922   BUN 20 05/14/2023 0922   CREATININE 1.15 (H) 05/14/2023 0922   CALCIUM 9.6 05/14/2023 0922   PROT 7.3 05/14/2023 0922   ALBUMIN 4.1 05/14/2023 0922   AST 13 (L) 05/14/2023 0922   ALT 9 05/14/2023 0922   ALKPHOS 101 05/14/2023 0922   BILITOT 0.5 05/14/2023 0922   GFRNONAA 52 (L) 05/14/2023 0922   GFRAA 81 (L) 03/23/2013 1443       ASSESSMENT and THERAPY PLAN:   Malignant neoplasm of upper-outer quadrant of left breast in female, 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  ------------------------------------------------------------------------------------------------------------------------------------------------ Treatment plan: 07/19/2022:Right lumpectomy: Grade 2 IDC, 1 cm with intermediate grade DCIS, ER 95%, PR 90%,, Ki-67 5%, HER2 -, 0/2 lymph nodes negative, anterior margin positive Left lumpectomy: Grade 3 IDC 1.1 cm with DCIS, margins negative, 0/2 lymph nodes negative, ER 100%, PR 100%, HER2 positive 3+, Ki-67 60% Adjuvant chemotherapy with Taxol  Herceptin  completed 10/29/2022 followed by Herceptin  maintenance Adjuvant radiation 12/19/2022-01/16/2023 Adjuvant antiestrogen therapy started 02/19/2023 ------------------------------------------------------------------------  Current treatment: Continue with Herceptin  every 3 weeks maintenance therapy until 07/16/2023   Breast Cancer On Herceptin  every three weeks, with an echocardiogram scheduled for 05/20/2023. Anastrozole  started in October, with significant joint aches and pains reported. Radiation completed in September. -Continue Herceptin  every three weeks. -Continue Anastrozole , monitor for  improvement of joint aches and pains. -Complete echocardiogram on 05/20/2023.   Peripheral Neuropathy Reports numbness and tingling in fingertips, toes, and feet, rarely impacting balance and  fine motor skills--stable. -Monitor symptoms, no change in management at this time.  Constipation Reports infrequent bowel movements, abdominal pain, and back pain. Adequate water intake and diet reported. -Consider adding Metamucil to diet for increased fiber intake. -Consider increasing daily physical activity.  Bone Health On Anastrozole  which can decrease bone density. -Order bone density test to establish baseline.  Breast Seroma Reports fluid collection in breast post-radiation. -Consider compression bra from [employer] [place] to aid in reabsorption of fluid.  General Health Maintenance -Schedule survivorship visit prior to final Herceptin  appointment.   All questions were answered. The patient knows to call the clinic with any problems, questions or concerns. We can certainly see the patient much sooner if necessary.  Total encounter time:20 minutes*in face-to-face visit time, chart review, lab review, care coordination, order entry, and documentation of the encounter time.    Morna Kendall, NP 05/17/23 4:30 PM Medical Oncology and Hematology [facility] [address] [place], [name] [address] Tel. [phone]    Fax. [phone]  *Total Encounter Time as defined by the Centers for Medicare and Medicaid Services includes, in addition to the face-to-face time of a patient visit (documented in the note above) non-face-to-face time: obtaining and reviewing outside history, ordering and reviewing medications, tests or procedures, care coordination (communications with other health care professionals or caregivers) and documentation in the medical record.

## 2023-05-14 NOTE — Patient Instructions (Signed)

## 2023-05-15 ENCOUNTER — Other Ambulatory Visit: Payer: Self-pay

## 2023-05-17 ENCOUNTER — Encounter: Payer: Self-pay | Admitting: Hematology and Oncology

## 2023-05-17 NOTE — Assessment & Plan Note (Addendum)
 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  ------------------------------------------------------------------------------------------------------------------------------------------------ Treatment plan: 07/19/2022:Right lumpectomy: Grade 2 IDC, 1 cm with intermediate grade DCIS, ER 95%, PR 90%,, Ki-67 5%, HER2 -, 0/2 lymph nodes negative, anterior margin positive Left lumpectomy: Grade 3 IDC 1.1 cm with DCIS, margins negative, 0/2 lymph nodes negative, ER 100%, PR 100%, HER2 positive 3+, Ki-67 60% Adjuvant chemotherapy with Taxol  Herceptin  completed 10/29/2022 followed by Herceptin  maintenance Adjuvant radiation 12/19/2022-01/16/2023 Adjuvant antiestrogen therapy started 02/19/2023 ------------------------------------------------------------------------  Current treatment: Continue with Herceptin  every 3 weeks maintenance therapy until 07/16/2023   Breast Cancer On Herceptin  every three weeks, with an echocardiogram scheduled for 05/20/2023. Anastrozole  started in October, with significant joint aches and pains reported. Radiation completed in September. -Continue Herceptin  every three weeks. -Continue Anastrozole , monitor for improvement of joint aches and pains. -Complete echocardiogram on 05/20/2023.   Peripheral Neuropathy Reports numbness and tingling in fingertips, toes, and feet, rarely impacting balance and fine motor skills--stable. -Monitor symptoms, no change in management at this time.  Constipation Reports infrequent bowel movements, abdominal pain, and back pain. Adequate water intake and diet reported. -Consider adding Metamucil to diet for increased fiber intake. -Consider increasing daily physical activity.  Bone Health On Anastrozole  which can decrease bone density. -Order bone density test to establish baseline.  Breast Seroma Reports fluid collection in  breast post-radiation. -Consider compression bra from [employer] [place] to aid in reabsorption of fluid.  General Health Maintenance -Schedule survivorship visit prior to final Herceptin  appointment.

## 2023-05-20 ENCOUNTER — Ambulatory Visit (HOSPITAL_COMMUNITY)
Admission: RE | Admit: 2023-05-20 | Discharge: 2023-05-20 | Disposition: A | Discharge status: Home / Self Care | Payer: Medicare Other | Source: Ambulatory Visit | Attending: Hematology and Oncology | Admitting: Hematology and Oncology

## 2023-05-20 DIAGNOSIS — Z0189 Encounter for other specified special examinations: Secondary | ICD-10-CM | POA: Diagnosis not present

## 2023-05-20 DIAGNOSIS — I10 Essential (primary) hypertension: Secondary | ICD-10-CM | POA: Diagnosis not present

## 2023-05-20 DIAGNOSIS — C50412 Malignant neoplasm of upper-outer quadrant of left female breast: Secondary | ICD-10-CM

## 2023-05-20 DIAGNOSIS — E785 Hyperlipidemia, unspecified: Secondary | ICD-10-CM | POA: Diagnosis not present

## 2023-05-20 DIAGNOSIS — Z01818 Encounter for other preprocedural examination: Secondary | ICD-10-CM | POA: Diagnosis not present

## 2023-05-20 DIAGNOSIS — Z87891 Personal history of nicotine dependence: Secondary | ICD-10-CM | POA: Insufficient documentation

## 2023-05-20 DIAGNOSIS — Z170 Estrogen receptor positive status [ER+]: Secondary | ICD-10-CM | POA: Diagnosis not present

## 2023-05-20 LAB — ECHOCARDIOGRAM COMPLETE
AR max vel: 2.63 cm2
AV Area VTI: 2.58 cm2
AV Area mean vel: 2.58 cm2
AV Mean grad: 4 mm[Hg]
AV Peak grad: 9.5 mm[Hg]
Ao pk vel: 1.54 m/s
Area-P 1/2: 3.72 cm2
S' Lateral: 2.7 cm

## 2023-05-20 IMAGING — MG MM DIGITAL SCREENING BILAT W/ TOMO AND CAD
8 series · 8 of 24 positions shown · non-contrast
Comparison: Previous exam(s).

CLINICAL DATA: Screening.

EXAM:
DIGITAL SCREENING BILATERAL MAMMOGRAM WITH TOMOSYNTHESIS AND CAD
TECHNIQUE: Bilateral screening digital craniocaudal and mediolateral oblique
mammograms were obtained. Bilateral screening digital breast
tomosynthesis was performed. The images were evaluated with
computer-aided detection.

[R MLO synth-2D]
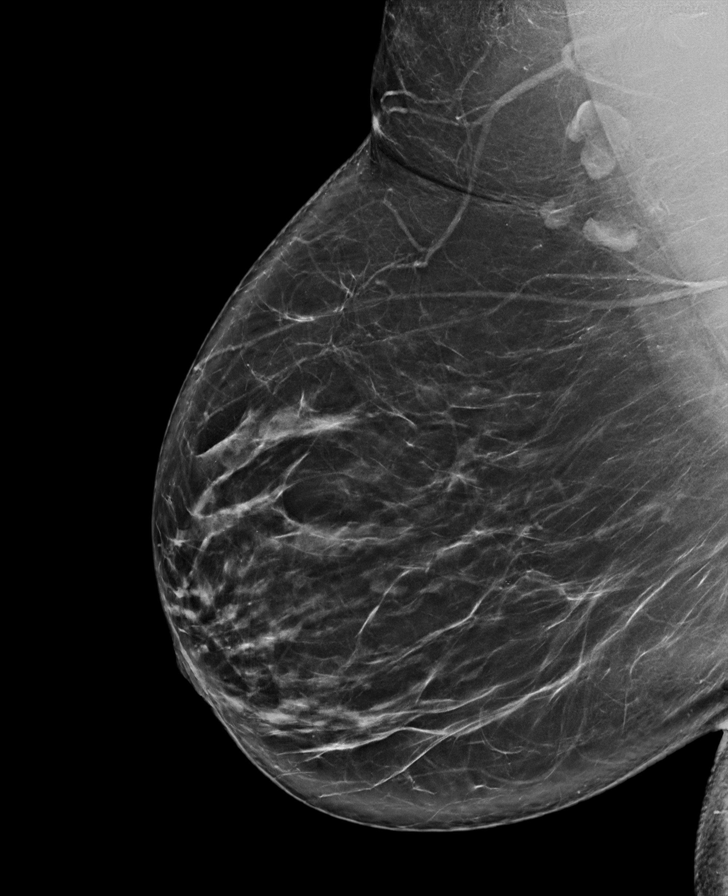

[L MLO synth-2D]
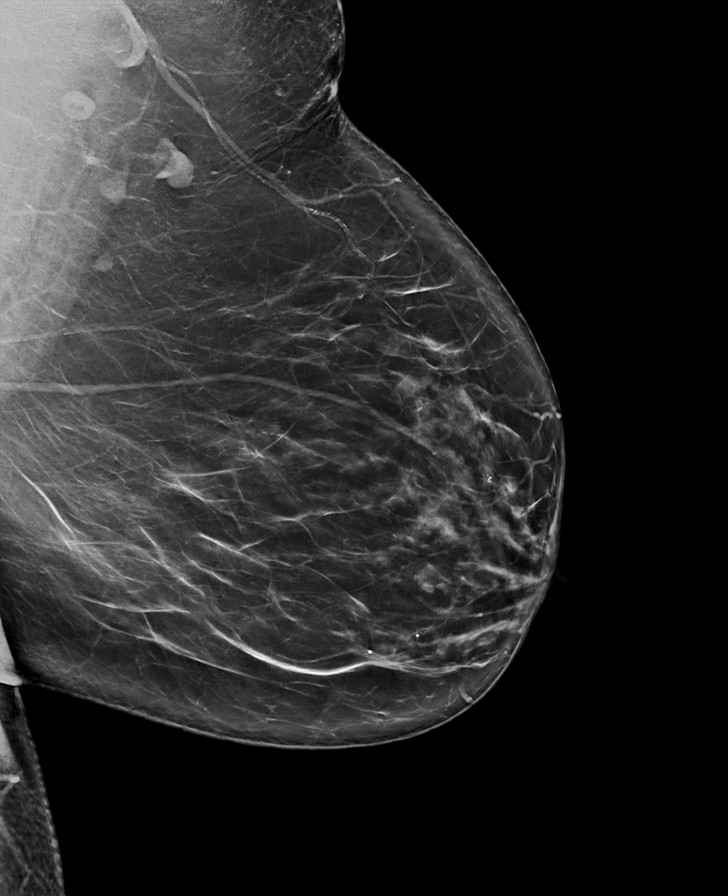

[L CC synth-2D]
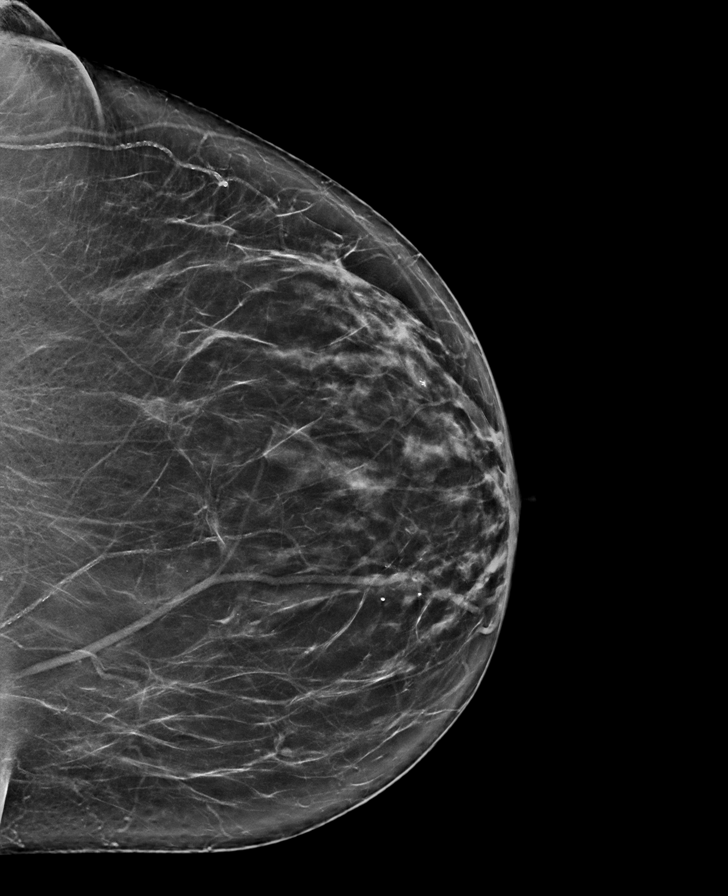

[R CC synth-2D]
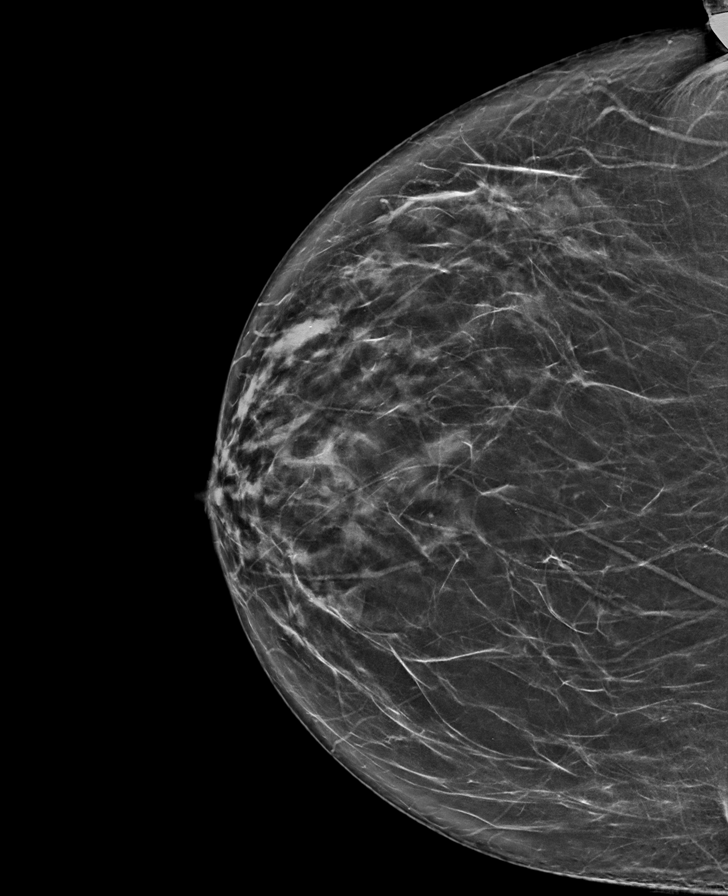

[L MLO tomo · tomo slice 45/90.0]
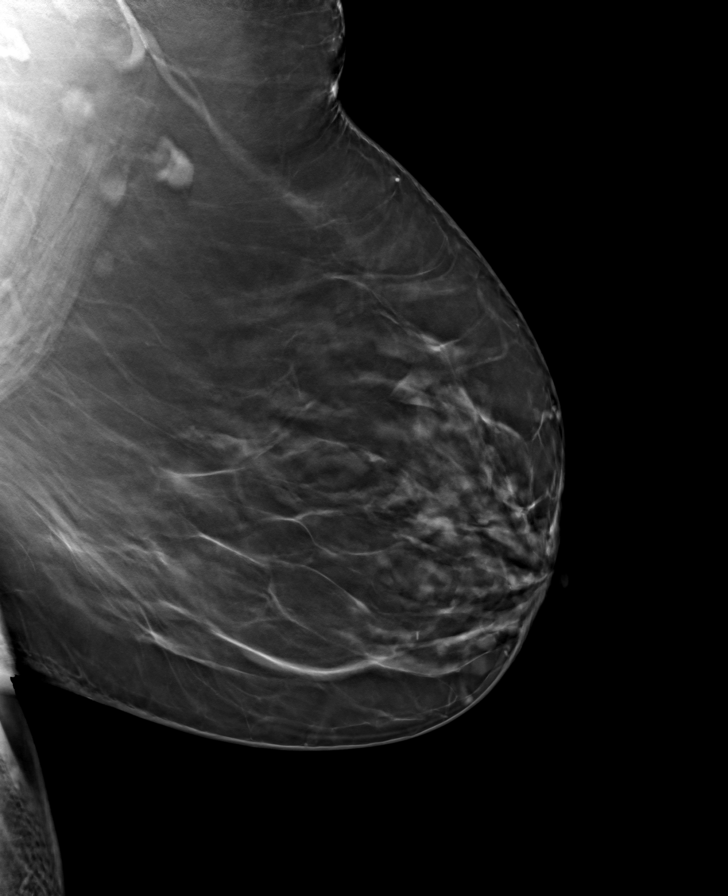

[R MLO tomo · tomo slice 46/91.0]
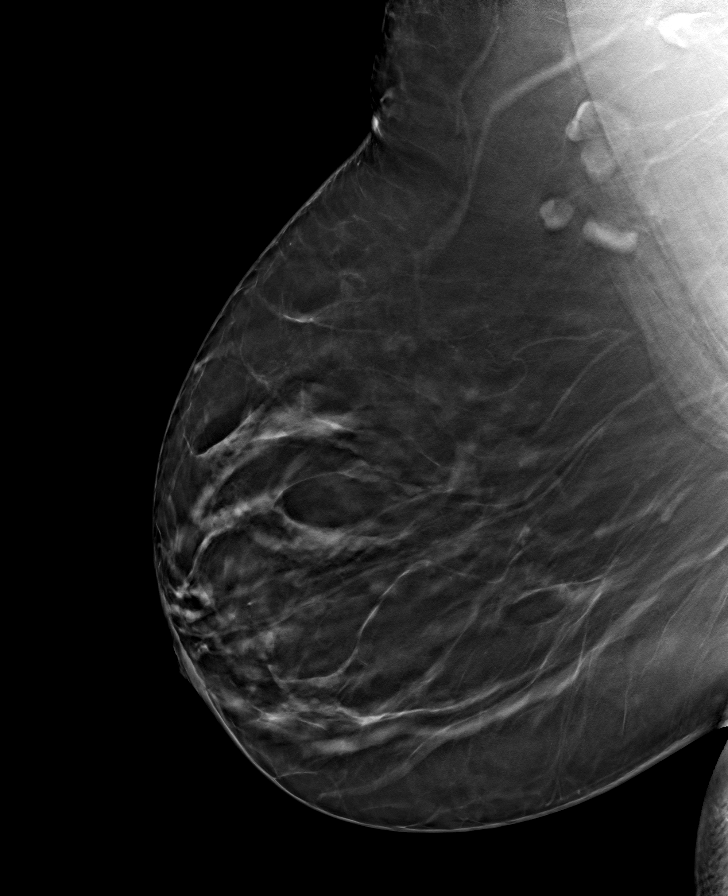

[L CC tomo · tomo slice 39/76.0]
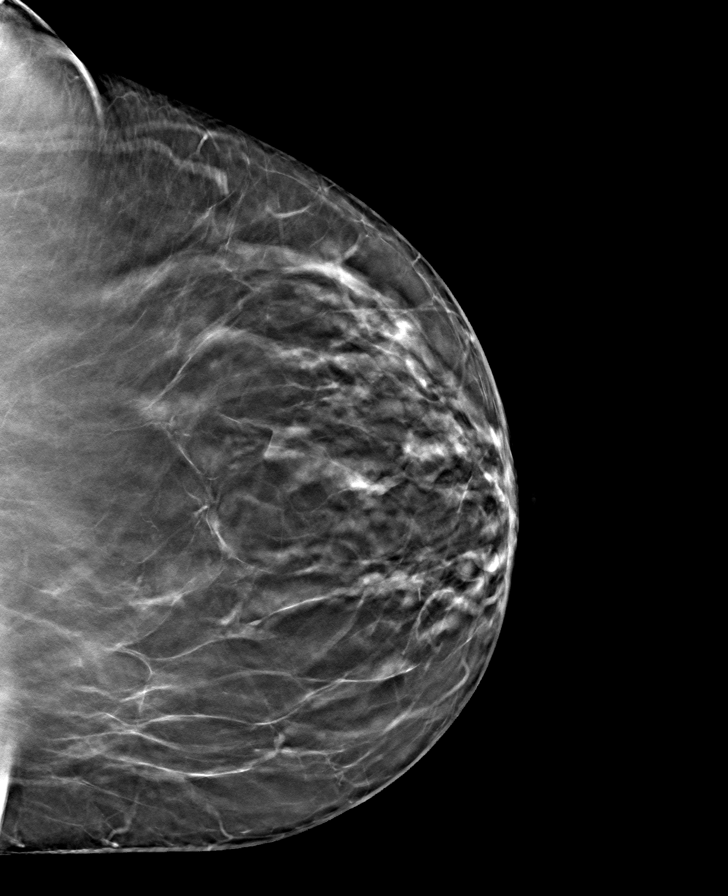

[R CC tomo · tomo slice 39/76.0]
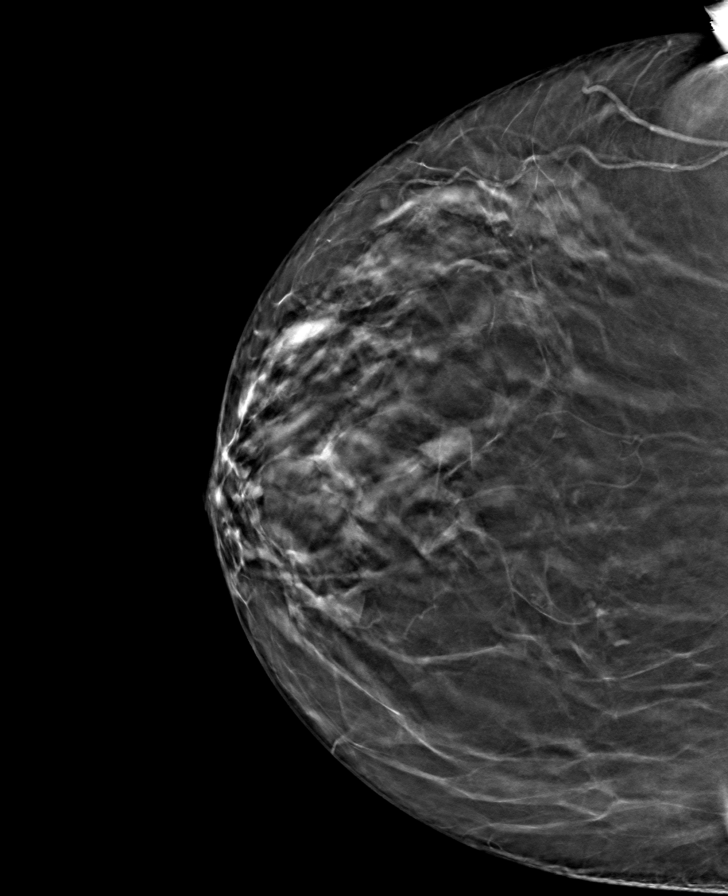

[8 of 24 positions shown; findings below may reference images not displayed]

ACR Breast Density Category b: There are scattered areas of
fibroglandular density.
FINDINGS: There are no findings suspicious for malignancy.
IMPRESSION: No mammographic evidence of malignancy. A result letter of this
screening mammogram will be mailed directly to the patient.

RECOMMENDATION:
Screening mammogram in one year. (Code:51-O-LD2)

BI-RADS CATEGORY  1: Negative.

## 2023-05-29 ENCOUNTER — Other Ambulatory Visit: Payer: Self-pay

## 2023-06-03 ENCOUNTER — Encounter: Payer: Self-pay | Admitting: *Deleted

## 2023-06-03 ENCOUNTER — Other Ambulatory Visit: Payer: Self-pay | Admitting: *Deleted

## 2023-06-03 DIAGNOSIS — C50412 Malignant neoplasm of upper-outer quadrant of left female breast: Secondary | ICD-10-CM

## 2023-06-04 ENCOUNTER — Inpatient Hospital Stay: Payer: Medicare Other | Attending: Hematology and Oncology

## 2023-06-04 ENCOUNTER — Inpatient Hospital Stay: Payer: Medicare Other

## 2023-06-04 VITALS — BP 117/66 | HR 68 | Temp 97.8°F | Resp 16 | Wt 230.5 lb

## 2023-06-04 DIAGNOSIS — C50412 Malignant neoplasm of upper-outer quadrant of left female breast: Secondary | ICD-10-CM | POA: Insufficient documentation

## 2023-06-04 DIAGNOSIS — Z79899 Other long term (current) drug therapy: Secondary | ICD-10-CM | POA: Insufficient documentation

## 2023-06-04 DIAGNOSIS — Z5112 Encounter for antineoplastic immunotherapy: Secondary | ICD-10-CM | POA: Diagnosis not present

## 2023-06-04 DIAGNOSIS — Z170 Estrogen receptor positive status [ER+]: Secondary | ICD-10-CM | POA: Insufficient documentation

## 2023-06-04 DIAGNOSIS — Z95828 Presence of other vascular implants and grafts: Secondary | ICD-10-CM

## 2023-06-04 LAB — CBC WITH DIFFERENTIAL (CANCER CENTER ONLY)
Abs Immature Granulocytes: 0.03 10*3/uL (ref 0.00–0.07)
Basophils Absolute: 0.1 10*3/uL (ref 0.0–0.1)
Basophils Relative: 1 %
Eosinophils Absolute: 0.1 10*3/uL (ref 0.0–0.5)
Eosinophils Relative: 2 %
HCT: 32.3 % — ABNORMAL LOW (ref 36.0–46.0)
Hemoglobin: 10.4 g/dL — ABNORMAL LOW (ref 12.0–15.0)
Immature Granulocytes: 0 %
Lymphocytes Relative: 18 %
Lymphs Abs: 1.2 10*3/uL (ref 0.7–4.0)
MCH: 27.4 pg (ref 26.0–34.0)
MCHC: 32.2 g/dL (ref 30.0–36.0)
MCV: 85.2 fL (ref 80.0–100.0)
Monocytes Absolute: 0.6 10*3/uL (ref 0.1–1.0)
Monocytes Relative: 8 %
Neutro Abs: 4.9 10*3/uL (ref 1.7–7.7)
Neutrophils Relative %: 71 %
Platelet Count: 385 10*3/uL (ref 150–400)
RBC: 3.79 MIL/uL — ABNORMAL LOW (ref 3.87–5.11)
RDW: 14.8 % (ref 11.5–15.5)
WBC Count: 6.9 10*3/uL (ref 4.0–10.5)
nRBC: 0 % (ref 0.0–0.2)

## 2023-06-04 LAB — CMP (CANCER CENTER ONLY)
ALT: 9 U/L (ref 0–44)
AST: 12 U/L — ABNORMAL LOW (ref 15–41)
Albumin: 4 g/dL (ref 3.5–5.0)
Alkaline Phosphatase: 116 U/L (ref 38–126)
Anion gap: 7 (ref 5–15)
BUN: 14 mg/dL (ref 8–23)
CO2: 29 mmol/L (ref 22–32)
Calcium: 9.9 mg/dL (ref 8.9–10.3)
Chloride: 101 mmol/L (ref 98–111)
Creatinine: 1.03 mg/dL — ABNORMAL HIGH (ref 0.44–1.00)
GFR, Estimated: 60 mL/min — ABNORMAL LOW (ref >60)
Glucose, Bld: 90 mg/dL (ref 70–99)
Potassium: 3.7 mmol/L (ref 3.5–5.1)
Sodium: 137 mmol/L (ref 135–145)
Total Bilirubin: 0.4 mg/dL (ref 0.0–1.2)
Total Protein: 7.4 g/dL (ref 6.5–8.1)

## 2023-06-04 MED ORDER — SODIUM CHLORIDE 0.9% FLUSH
10.0000 mL | INTRAVENOUS | Status: DC | PRN
Start: 2023-06-04 — End: 2023-06-04
  Administered 2023-06-04: 10 mL

## 2023-06-04 MED ORDER — HEPARIN SOD (PORK) LOCK FLUSH 100 UNIT/ML IV SOLN
500.0000 [IU] | Freq: Once | INTRAVENOUS | Status: AC | PRN
  Administered 2023-06-04: 500 [IU]

## 2023-06-04 MED ORDER — SODIUM CHLORIDE 0.9% FLUSH
10.0000 mL | INTRAVENOUS | Status: DC | PRN
  Administered 2023-06-04: 10 mL

## 2023-06-04 MED ORDER — SODIUM CHLORIDE 0.9 % IV SOLN
600.0000 mg | Freq: Once | INTRAVENOUS | Status: AC
  Administered 2023-06-04: 600 mg via INTRAVENOUS
  Filled 2023-06-04: qty 28.57

## 2023-06-04 MED ORDER — SODIUM CHLORIDE 0.9 % IV SOLN
Freq: Once | INTRAVENOUS | Status: AC
Start: 2023-06-04 — End: 2023-06-04

## 2023-06-04 MED ORDER — DIPHENHYDRAMINE HCL 25 MG PO CAPS
50.0000 mg | ORAL_CAPSULE | Freq: Once | ORAL | Status: AC
  Administered 2023-06-04: 50 mg via ORAL
  Filled 2023-06-04: qty 2

## 2023-06-04 MED ORDER — ACETAMINOPHEN 325 MG PO TABS
650.0000 mg | ORAL_TABLET | Freq: Once | ORAL | Status: AC
  Administered 2023-06-04: 650 mg via ORAL
  Filled 2023-06-04: qty 2

## 2023-06-04 NOTE — Patient Instructions (Signed)
 [facility] - A DEPT OF [facility]  Discharge Instructions: Thank you for choosing [facility] to provide your oncology and hematology care.   If you have a lab appointment with the Cancer Center, please go directly to the Cancer Center and check in at the registration area.   Wear comfortable clothing and clothing appropriate for easy access to any Portacath or PICC line.   We strive to give you quality time with your provider. You may need to reschedule your appointment if you arrive late (15 or more minutes).  Arriving late affects you and other patients whose appointments are after yours.  Also, if you miss three or more appointments without notifying the office, you may be dismissed from the clinic at the provider's discretion.      For prescription refill requests, have your pharmacy contact our office and allow 72 hours for refills to be completed.    Today you received the following chemotherapy and/or immunotherapy agents : Trastuzumab      To help prevent nausea and vomiting after your treatment, we encourage you to take your nausea medication as directed.  BELOW ARE SYMPTOMS THAT SHOULD BE REPORTED IMMEDIATELY: *FEVER GREATER THAN 100.4 F (38 C) OR HIGHER *CHILLS OR SWEATING *NAUSEA AND VOMITING THAT IS NOT CONTROLLED WITH YOUR NAUSEA MEDICATION *UNUSUAL SHORTNESS OF BREATH *UNUSUAL BRUISING OR BLEEDING *URINARY PROBLEMS (pain or burning when urinating, or frequent urination) *BOWEL PROBLEMS (unusual diarrhea, constipation, pain near the anus) TENDERNESS IN MOUTH AND THROAT WITH OR WITHOUT PRESENCE OF ULCERS (sore throat, sores in mouth, or a toothache) UNUSUAL RASH, SWELLING OR PAIN  UNUSUAL VAGINAL DISCHARGE OR ITCHING   Items with * indicate a potential emergency and should be followed up as soon as possible or go to the Emergency Department if any problems should occur.  Please show the CHEMOTHERAPY ALERT CARD or  IMMUNOTHERAPY ALERT CARD at check-in to the Emergency Department and triage nurse.  Should you have questions after your visit or need to cancel or reschedule your appointment, please contact [facility] - A DEPT OF [facility]  Dept: [phone]  and follow the prompts.  Office hours are 8:00 a.m. to 4:30 p.m. Monday - Friday. Please note that voicemails left after 4:00 p.m. may not be returned until the following business day.  We are closed weekends and major holidays. You have access to a nurse at all times for urgent questions. Please call the main number to the clinic Dept: [phone] and follow the prompts.   For any non-urgent questions, you may also contact your provider using MyChart. We now offer e-Visits for anyone 24 and older to request care online for non-urgent symptoms. For details visit [email or web address].   Also download the MyChart app! Go to the app store, search "MyChart", open the app, select [facility], and log in with your MyChart username and password.

## 2023-06-05 DIAGNOSIS — C50412 Malignant neoplasm of upper-outer quadrant of left female breast: Secondary | ICD-10-CM | POA: Diagnosis not present

## 2023-06-05 DIAGNOSIS — C50911 Malignant neoplasm of unspecified site of right female breast: Secondary | ICD-10-CM | POA: Diagnosis not present

## 2023-06-10 ENCOUNTER — Encounter: Payer: Self-pay | Admitting: Hematology and Oncology

## 2023-06-11 DIAGNOSIS — M869 Osteomyelitis, unspecified: Secondary | ICD-10-CM | POA: Diagnosis not present

## 2023-06-11 DIAGNOSIS — Z299 Encounter for prophylactic measures, unspecified: Secondary | ICD-10-CM | POA: Diagnosis not present

## 2023-06-11 DIAGNOSIS — L309 Dermatitis, unspecified: Secondary | ICD-10-CM | POA: Diagnosis not present

## 2023-06-11 DIAGNOSIS — R42 Dizziness and giddiness: Secondary | ICD-10-CM | POA: Diagnosis not present

## 2023-06-11 DIAGNOSIS — I700 Atherosclerosis of aorta: Secondary | ICD-10-CM | POA: Diagnosis not present

## 2023-06-24 NOTE — Assessment & Plan Note (Signed)
 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  ------------------------------------------------------------------------------------------------------------------------------------------------ Treatment plan: 07/19/2022:Right lumpectomy: Grade 2 IDC, 1 cm with intermediate grade DCIS, ER 95%, PR 90%,, Ki-67 5%, HER2 -, 0/2 lymph nodes negative, anterior margin positive Left lumpectomy: Grade 3 IDC 1.1 cm with DCIS, margins negative, 0/2 lymph nodes negative, ER 100%, PR 100%, HER2 positive 3+, Ki-67 60% Adjuvant chemotherapy with Taxol  Herceptin  completed 10/29/2022 followed by Herceptin  maintenance Adjuvant radiation 12/19/2022-01/16/2023 Adjuvant antiestrogen therapy started 02/19/2023 ------------------------------------------------------------------------  Current treatment: Continue with Herceptin  every 3 weeks maintenance therapy until 07/16/2023   Anastrozole  toxicities: Tolerating extremely well without any side effects.  Denies any hot flashes or arthralgias or myalgias. She is now working 9 hours a day 5 days a week full-time.     -Order echocardiogram for January 2025 to monitor cardiac function.   Mild Anemia Hemoglobin improving but still slightly below normal range. -Continue to monitor hemoglobin levels. -Next treatment on 04/23/2023 and follow-up appointment on 05/14/2023.   General Health Maintenance -Last Herceptin  treatment scheduled for 07/16/2023.       Return to clinic every 3 weeks for Herceptin  every 6 weeks with follow-up with us .

## 2023-06-25 ENCOUNTER — Encounter: Payer: Self-pay | Admitting: Hematology and Oncology

## 2023-06-25 ENCOUNTER — Inpatient Hospital Stay: Payer: Medicare Other

## 2023-06-25 ENCOUNTER — Ambulatory Visit (HOSPITAL_COMMUNITY)
Admission: RE | Admit: 2023-06-25 | Discharge: 2023-06-25 | Disposition: A | Discharge status: Home / Self Care | Payer: Medicare Other | Source: Ambulatory Visit | Attending: Hematology and Oncology | Admitting: Hematology and Oncology

## 2023-06-25 ENCOUNTER — Inpatient Hospital Stay: Payer: Medicare Other | Attending: Hematology and Oncology | Admitting: Hematology and Oncology

## 2023-06-25 ENCOUNTER — Telehealth: Payer: Self-pay | Admitting: Hematology and Oncology

## 2023-06-25 ENCOUNTER — Other Ambulatory Visit: Payer: Self-pay | Admitting: Radiation Therapy

## 2023-06-25 VITALS — BP 120/62 | HR 78 | Temp 97.9°F | Resp 18 | Ht 67.0 in | Wt 231.9 lb

## 2023-06-25 DIAGNOSIS — M797 Fibromyalgia: Secondary | ICD-10-CM | POA: Insufficient documentation

## 2023-06-25 DIAGNOSIS — C50919 Malignant neoplasm of unspecified site of unspecified female breast: Secondary | ICD-10-CM | POA: Diagnosis not present

## 2023-06-25 DIAGNOSIS — Z79811 Long term (current) use of aromatase inhibitors: Secondary | ICD-10-CM | POA: Insufficient documentation

## 2023-06-25 DIAGNOSIS — Z5112 Encounter for antineoplastic immunotherapy: Secondary | ICD-10-CM | POA: Diagnosis not present

## 2023-06-25 DIAGNOSIS — Z79620 Long term (current) use of immunosuppressive biologic: Secondary | ICD-10-CM | POA: Insufficient documentation

## 2023-06-25 DIAGNOSIS — E785 Hyperlipidemia, unspecified: Secondary | ICD-10-CM | POA: Diagnosis not present

## 2023-06-25 DIAGNOSIS — Z1731 Human epidermal growth factor receptor 2 positive status: Secondary | ICD-10-CM | POA: Insufficient documentation

## 2023-06-25 DIAGNOSIS — Z90710 Acquired absence of both cervix and uterus: Secondary | ICD-10-CM | POA: Insufficient documentation

## 2023-06-25 DIAGNOSIS — Z803 Family history of malignant neoplasm of breast: Secondary | ICD-10-CM | POA: Diagnosis not present

## 2023-06-25 DIAGNOSIS — Z841 Family history of disorders of kidney and ureter: Secondary | ICD-10-CM | POA: Insufficient documentation

## 2023-06-25 DIAGNOSIS — I10 Essential (primary) hypertension: Secondary | ICD-10-CM | POA: Diagnosis not present

## 2023-06-25 DIAGNOSIS — Z79899 Other long term (current) drug therapy: Secondary | ICD-10-CM | POA: Insufficient documentation

## 2023-06-25 DIAGNOSIS — G44209 Tension-type headache, unspecified, not intractable: Secondary | ICD-10-CM | POA: Insufficient documentation

## 2023-06-25 DIAGNOSIS — Z800 Family history of malignant neoplasm of digestive organs: Secondary | ICD-10-CM | POA: Insufficient documentation

## 2023-06-25 DIAGNOSIS — R42 Dizziness and giddiness: Secondary | ICD-10-CM | POA: Insufficient documentation

## 2023-06-25 DIAGNOSIS — R519 Headache, unspecified: Secondary | ICD-10-CM | POA: Insufficient documentation

## 2023-06-25 DIAGNOSIS — Z823 Family history of stroke: Secondary | ICD-10-CM | POA: Insufficient documentation

## 2023-06-25 DIAGNOSIS — R29818 Other symptoms and signs involving the nervous system: Secondary | ICD-10-CM | POA: Diagnosis not present

## 2023-06-25 DIAGNOSIS — I671 Cerebral aneurysm, nonruptured: Secondary | ICD-10-CM | POA: Insufficient documentation

## 2023-06-25 DIAGNOSIS — Z170 Estrogen receptor positive status [ER+]: Secondary | ICD-10-CM

## 2023-06-25 DIAGNOSIS — C50412 Malignant neoplasm of upper-outer quadrant of left female breast: Secondary | ICD-10-CM

## 2023-06-25 DIAGNOSIS — Z8249 Family history of ischemic heart disease and other diseases of the circulatory system: Secondary | ICD-10-CM | POA: Insufficient documentation

## 2023-06-25 DIAGNOSIS — R930 Abnormal findings on diagnostic imaging of skull and head, not elsewhere classified: Secondary | ICD-10-CM | POA: Insufficient documentation

## 2023-06-25 DIAGNOSIS — I7090 Unspecified atherosclerosis: Secondary | ICD-10-CM | POA: Insufficient documentation

## 2023-06-25 DIAGNOSIS — Z1721 Progesterone receptor positive status: Secondary | ICD-10-CM | POA: Diagnosis not present

## 2023-06-25 DIAGNOSIS — M47812 Spondylosis without myelopathy or radiculopathy, cervical region: Secondary | ICD-10-CM | POA: Insufficient documentation

## 2023-06-25 DIAGNOSIS — Z881 Allergy status to other antibiotic agents status: Secondary | ICD-10-CM | POA: Insufficient documentation

## 2023-06-25 DIAGNOSIS — Z8051 Family history of malignant neoplasm of kidney: Secondary | ICD-10-CM | POA: Diagnosis not present

## 2023-06-25 DIAGNOSIS — I6782 Cerebral ischemia: Secondary | ICD-10-CM | POA: Diagnosis not present

## 2023-06-25 MED ORDER — HEPARIN SOD (PORK) LOCK FLUSH 100 UNIT/ML IV SOLN
500.0000 [IU] | Freq: Once | INTRAVENOUS | Status: AC | PRN
  Administered 2023-06-25: 500 [IU]

## 2023-06-25 MED ORDER — SODIUM CHLORIDE 0.9% FLUSH
10.0000 mL | INTRAVENOUS | Status: DC | PRN
  Administered 2023-06-25: 10 mL

## 2023-06-25 MED ORDER — DIPHENHYDRAMINE HCL 25 MG PO CAPS
50.0000 mg | ORAL_CAPSULE | Freq: Once | ORAL | Status: AC
  Administered 2023-06-25: 50 mg via ORAL
  Filled 2023-06-25: qty 2

## 2023-06-25 MED ORDER — ACETAMINOPHEN 325 MG PO TABS
650.0000 mg | ORAL_TABLET | Freq: Once | ORAL | Status: AC
  Administered 2023-06-25: 650 mg via ORAL
  Filled 2023-06-25: qty 2

## 2023-06-25 MED ORDER — GADOBUTROL 1 MMOL/ML IV SOLN
10.0000 mL | Freq: Once | INTRAVENOUS | Status: AC | PRN
  Administered 2023-06-25: 10 mL via INTRAVENOUS

## 2023-06-25 MED ORDER — TRASTUZUMAB-ANNS CHEMO 150 MG IV SOLR
600.0000 mg | Freq: Once | INTRAVENOUS | Status: AC
  Administered 2023-06-25: 600 mg via INTRAVENOUS
  Filled 2023-06-25: qty 28.57

## 2023-06-25 MED ORDER — SODIUM CHLORIDE 0.9 % IV SOLN: Freq: Once | INTRAVENOUS | Status: AC

## 2023-06-25 NOTE — Patient Instructions (Signed)
[facility] - A DEPT OF [facility]  Discharge Instructions: Thank you for choosing [facility] to provide your oncology and hematology care.   If you have a lab appointment with the Cancer Center, please go directly to the Cancer Center and check in at the registration area.   Wear comfortable clothing and clothing appropriate for easy access to any Portacath or PICC line.   We strive to give you quality time with your provider. You may need to reschedule your appointment if you arrive late (15 or more minutes).  Arriving late affects you and other patients whose appointments are after yours.  Also, if you miss three or more appointments without notifying the office, you may be dismissed from the clinic at the provider's discretion.      For prescription refill requests, have your pharmacy contact our office and allow 72 hours for refills to be completed.    Today you received the following chemotherapy and/or immunotherapy agents kanjinti      To help prevent nausea and vomiting after your treatment, we encourage you to take your nausea medication as directed.  BELOW ARE SYMPTOMS THAT SHOULD BE REPORTED IMMEDIATELY: *FEVER GREATER THAN 100.4 F (38 C) OR HIGHER *CHILLS OR SWEATING *NAUSEA AND VOMITING THAT IS NOT CONTROLLED WITH YOUR NAUSEA MEDICATION *UNUSUAL SHORTNESS OF BREATH *UNUSUAL BRUISING OR BLEEDING *URINARY PROBLEMS (pain or burning when urinating, or frequent urination) *BOWEL PROBLEMS (unusual diarrhea, constipation, pain near the anus) TENDERNESS IN MOUTH AND THROAT WITH OR WITHOUT PRESENCE OF ULCERS (sore throat, sores in mouth, or a toothache) UNUSUAL RASH, SWELLING OR PAIN  UNUSUAL VAGINAL DISCHARGE OR ITCHING   Items with * indicate a potential emergency and should be followed up as soon as possible or go to the Emergency Department if any problems should occur.  Please show the CHEMOTHERAPY ALERT CARD or IMMUNOTHERAPY  ALERT CARD at check-in to the Emergency Department and triage nurse.  Should you have questions after your visit or need to cancel or reschedule your appointment, please contact [facility] - A DEPT OF [facility]  Dept: [phone]  and follow the prompts.  Office hours are 8:00 a.m. to 4:30 p.m. Monday - Friday. Please note that voicemails left after 4:00 p.m. may not be returned until the following business day.  We are closed weekends and major holidays. You have access to a nurse at all times for urgent questions. Please call the main number to the clinic Dept: [phone] and follow the prompts.   For any non-urgent questions, you may also contact your provider using MyChart. We now offer e-Visits for anyone 81 and older to request care online for non-urgent symptoms. For details visit [email or web address].   Also download the MyChart app! Go to the app store, search "MyChart", open the app, select [facility], and log in with your MyChart username and password.

## 2023-06-25 NOTE — Telephone Encounter (Signed)
I left a voicemail for the patient to call us back. The MRI of the brain suggested a 1.2 cm focus in the right proximal paraclinoid region suspicious for aneurysm versus a dural based mass likely meningioma. I sent a message to Dr. Barbaraann Cao for consultation.

## 2023-06-25 NOTE — Progress Notes (Signed)
Patient Care Team: [name], MD as PCP - General (Internal Medicine) [name], MD as Consulting Physician (Hematology and Oncology) Almond Lint, MD as Consulting Physician (General Surgery)  DIAGNOSIS:  Encounter Diagnoses  Name Primary?   Malignant neoplasm of upper-outer quadrant of left breast in female, estrogen receptor positive (HCC) Yes   Frequent headaches     SUMMARY OF ONCOLOGIC HISTORY: Oncology History  Malignant neoplasm of upper-outer quadrant of left breast in female, estrogen receptor positive (HCC)  04/30/2022 Initial Diagnosis   Screening mammogram to treat left breast mass measuring 0.7 cm, adjacent mass 0.5 cm, biopsy of both revealed grade 3 IDC with DCIS ER 100% PR 100% Ki-67 60%, HER2 3+ positive   05/25/2022 Cancer Staging   Staging form: Breast, AJCC 8th Edition - Clinical: Stage IA (cT1b, cN0, cM0, G3, ER+, PR+, HER2+) - Signed by [name], MD on 05/25/2022 Stage prefix: Initial diagnosis Histologic grading system: 3 grade system   07/19/2022 Surgery   Right lumpectomy: Grade 2 IDC, 1 cm with intermediate grade DCIS, ER 95%, PR 90%,, Ki-67 5%, HER2 -, 0/2 lymph nodes negative, anterior margin positive Left lumpectomy: Grade 3 IDC 1.1 cm with DCIS, margins negative, 0/2 lymph nodes negative, ER 100%, PR 100%, HER2 positive 3+, Ki-67 60%   07/29/2022 Genetic Testing   Negative genetic testing on the CancerNext-Expanded+RNAinsight panel.  The report date is July 29, 2022.  The CancerNext-Expanded gene panel offered by [facility] and includes sequencing and rearrangement analysis for the following 77 genes: AIP, ALK, APC*, ATM*, AXIN2, BAP1, BARD1, BMPR1A, BRCA1*, BRCA2*, BRIP1*, CDC73, CDH1*, CDK4, CDKN1B, CDKN2A, CHEK2*, CTNNA1, DICER1, FH, FLCN, KIF1B, LZTR1, MAX, MEN1, MET, MLH1*, MSH2*, MSH3, MSH6*, MUTYH*, NF1*, NF2, NTHL1, PALB2*, PHOX2B, PMS2*, POT1, PRKAR1A, PTCH1, PTEN*, RAD51C*, RAD51D*, RB1, RET, SDHA, SDHAF2, SDHB, SDHC, SDHD,  SMAD4, SMARCA4, SMARCB1, SMARCE1, STK11, SUFU, TMEM127, TP53*, TSC1, TSC2, and VHL (sequencing and deletion/duplication); EGFR, EGLN1, HOXB13, KIT, MITF, PDGFRA, POLD1, and POLE (sequencing only); EPCAM and GREM1 (deletion/duplication only). DNA and RNA analyses performed for * genes.    08/14/2022 -  Chemotherapy   Patient is on Treatment Plan : BREAST Paclitaxel + Trastuzumab q7d / Trastuzumab q21d     12/18/2022 - 01/16/2023 Radiation Therapy   First Treatment Date: 2022-12-18 - Last Treatment Date: 2023-01-16   Plan Name: Breast_L Site: Breast, Left Technique: 3D Mode: Photon Dose Per Fraction: 2.66 Gy Prescribed Dose (Delivered / Prescribed): 42.56 Gy / 42.56 Gy Prescribed Fxs (Delivered / Prescribed): 16 / 16   Plan Name: Breast_L_Bst Site: Breast, Left Technique: 3D Mode: Photon Dose Per Fraction: 2 Gy Prescribed Dose (Delivered / Prescribed): 8 Gy / 8 Gy Prescribed Fxs (Delivered / Prescribed): 4 / 4   Plan Name: Breast_R Site: Breast, Right Technique: 3D Mode: Photon Dose Per Fraction: 2.66 Gy Prescribed Dose (Delivered / Prescribed): 42.56 Gy / 42.56 Gy Prescribed Fxs (Delivered / Prescribed): 16 / 16   Plan Name: Breast_R_Bst Site: Breast, Right Technique: 3D Mode: Photon Dose Per Fraction: 2 Gy Prescribed Dose (Delivered / Prescribed): 8 Gy / 8 Gy Prescribed Fxs (Delivered / Prescribed): 4 / 4   02/2023 -  Anti-estrogen oral therapy   Anastrozole daily     CHIEF COMPLIANT: Follow-up on Herceptin  HISTORY OF PRESENT ILLNESS:   History of Present Illness   Christy Lynch is a 68 year old female who presents with headaches and joint pain.  She has been experiencing severe headaches intermittently for a few months, often waking  up with them at 5:00 AM. The headaches persist throughout the day and are accompanied by nausea. She recalls having similar headaches approximately twenty years ago, diagnosed as migraines, which resolved after treatment. She is  uncertain if the current headaches are related to her past migraines or another cause.  She experiences significant joint pain, primarily in her legs, especially the knees. The pain is persistent throughout the day and does not improve with Tylenol. She associates the joint pain with a medication she is taking, implied to be related to her cancer treatment regimen. She is currently undergoing treatment with Herceptin and another unspecified medication, which she believes may be contributing to her symptoms. Despite these symptoms, she denies experiencing diarrhea from the infusion treatments.  She works long hours, five days a week, nine hours a day, but reports sleeping well at night despite waking up with headaches.         ALLERGIES:  is allergic to flagyl [metronidazole].  MEDICATIONS:  Current Outpatient Medications  Medication Sig Dispense Refill   anastrozole (ARIMIDEX) 1 MG tablet Take 1 tablet (1 mg total) by mouth daily. 90 tablet 3   aspirin EC 81 MG tablet Take 81 mg by mouth daily. Swallow whole.     atenolol (TENORMIN) 50 MG tablet Take 50 mg by mouth every evening.     chlorhexidine (PERIDEX) 0.12 % solution 15 mLs 2 (two) times daily.     DULoxetine (CYMBALTA) 60 MG capsule Take 60 mg by mouth every evening.     esomeprazole (NEXIUM) 40 MG capsule Take 40 mg by mouth daily at 12 noon.     lidocaine (XYLOCAINE) 2 % solution 15 mLs.     lidocaine-prilocaine (EMLA) cream APPLY TO AFFECTED AREA ONCE AS DIRECTED 30 g 0   lisinopril-hydrochlorothiazide (ZESTORETIC) 20-25 MG tablet Take 1 tablet by mouth daily.     Multiple Vitamin (MULTIVITAMIN WITH MINERALS) TABS tablet Take 1 tablet by mouth every evening.     No current facility-administered medications for this visit.    PHYSICAL EXAMINATION: ECOG PERFORMANCE STATUS: 1 - Symptomatic but completely ambulatory  Vitals:   06/25/23 0817  BP: 120/62  Pulse: 78  Resp: 18  Temp: 97.9 F (36.6 C)  SpO2: 95%   Filed  Weights   06/25/23 0817  Weight: 231 lb 14.4 oz (105.2 kg)    Physical Exam          (exam performed in the presence of a chaperone)  LABORATORY DATA:  I have reviewed the data as listed    Latest Ref Rng & Units 06/04/2023    8:50 AM 05/14/2023    9:22 AM 04/02/2023   10:04 AM  CMP  Glucose 70 - 99 mg/dL 90  191  478   BUN 8 - 23 mg/dL 14  20  15    Creatinine 0.44 - 1.00 mg/dL 2.95  6.21  3.08   Sodium 135 - 145 mmol/L 137  136  134   Potassium 3.5 - 5.1 mmol/L 3.7  3.6  3.7   Chloride 98 - 111 mmol/L 101  101  98   CO2 22 - 32 mmol/L 29  27  28    Calcium 8.9 - 10.3 mg/dL 9.9  9.6  9.9   Total Protein 6.5 - 8.1 g/dL 7.4  7.3  7.7   Total Bilirubin 0.0 - 1.2 mg/dL 0.4  0.5  0.4   Alkaline Phos 38 - 126 U/L 116  101  132   AST 15 -  41 U/L 12  13  16    ALT 0 - 44 U/L 9  9  13      Lab Results  Component Value Date   WBC 6.9 06/04/2023   HGB 10.4 (L) 06/04/2023   HCT 32.3 (L) 06/04/2023   MCV 85.2 06/04/2023   PLT 385 06/04/2023   NEUTROABS 4.9 06/04/2023    ASSESSMENT & PLAN:  Malignant neoplasm of upper-outer quadrant of left breast in female, 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  ------------------------------------------------------------------------------------------------------------------------------------------------ Treatment plan: 07/19/2022:Right lumpectomy: Grade 2 IDC, 1 cm with intermediate grade DCIS, ER 95%, PR 90%,, Ki-67 5%, HER2 -, 0/2 lymph nodes negative, anterior margin positive Left lumpectomy: Grade 3 IDC 1.1 cm with DCIS, margins negative, 0/2 lymph nodes negative, ER 100%, PR 100%, HER2 positive 3+, Ki-67 60% Adjuvant chemotherapy with Taxol Herceptin completed 10/29/2022 followed by Herceptin maintenance Adjuvant radiation 12/19/2022-01/16/2023 Adjuvant antiestrogen therapy started  02/19/2023 ------------------------------------------------------------------------  Current treatment: Continue with Herceptin every 3 weeks maintenance therapy until 07/16/2023   Anastrozole toxicities: Tolerating extremely well without any side effects.  Denies any hot flashes or arthralgias or myalgias. She is now working 9 hours a day 5 days a week full-     Headaches: Possibly migrainous versus another etiology.  We will obtain a stat brain MRI to rule out brain metastases. Diffuse joint aches and pains: Could be related to anastrozole.  I instructed her to stop anastrozole today and when we see her back in 3 weeks we can decide if she needs to switch to another antiestrogen therapy.  Return to clinic every 3 weeks for last treatment of Herceptin   Orders Placed This Encounter  Procedures   MR Brain W Wo Contrast    Standing Status:   Future    Expected Date:   06/25/2023    Expiration Date:   06/24/2024    If indicated for the ordered procedure, I authorize the administration of contrast media per Radiology protocol:   Yes    What is the patient's sedation requirement?:   No Sedation    Does the patient have a pacemaker or implanted devices?:   No    Use SRS Protocol?:   No    Preferred imaging location?:   [facility] (table limit - 550 lbs)    Release to patient:   Immediate   The patient has a good understanding of the overall plan. she agrees with it. she will call with any problems that may develop before the next visit here. Total time spent: 30 mins including face to face time and time spent for planning, charting and co-ordination of care   Tamsen Meek, MD 06/25/23

## 2023-06-26 NOTE — Progress Notes (Signed)
Forwarded a message from Dr. Barbaraann Cao to the scheduler to have an appointment set for next week after Tumor Board to see this patient.

## 2023-06-27 ENCOUNTER — Telehealth: Payer: Self-pay | Admitting: Internal Medicine

## 2023-06-27 NOTE — Telephone Encounter (Signed)
Patient stated she has a death in her family and cannot make the appointment next week. Patient scheduled 2 weeks out.

## 2023-06-28 ENCOUNTER — Telehealth: Payer: Self-pay | Admitting: Internal Medicine

## 2023-06-28 ENCOUNTER — Telehealth: Payer: Self-pay | Admitting: Hematology and Oncology

## 2023-06-28 NOTE — Telephone Encounter (Signed)
Rescheduled and scheduled appointments per 2/11 los. Patient is aware of the changes made to her upcoming appointment as well as the made appointments.

## 2023-06-28 NOTE — Telephone Encounter (Signed)
Marland Kitchen

## 2023-07-01 ENCOUNTER — Inpatient Hospital Stay: Payer: Medicare Other

## 2023-07-02 ENCOUNTER — Inpatient Hospital Stay: Payer: Medicare Other | Admitting: Internal Medicine

## 2023-07-02 ENCOUNTER — Ambulatory Visit: Payer: Medicare Other | Admitting: Internal Medicine

## 2023-07-02 ENCOUNTER — Telehealth: Payer: Self-pay | Admitting: *Deleted

## 2023-07-02 VITALS — BP 153/78 | HR 72 | Temp 97.9°F | Resp 15 | Wt 227.6 lb

## 2023-07-02 DIAGNOSIS — Z881 Allergy status to other antibiotic agents status: Secondary | ICD-10-CM | POA: Diagnosis not present

## 2023-07-02 DIAGNOSIS — R42 Dizziness and giddiness: Secondary | ICD-10-CM | POA: Diagnosis not present

## 2023-07-02 DIAGNOSIS — I671 Cerebral aneurysm, nonruptured: Secondary | ICD-10-CM | POA: Diagnosis not present

## 2023-07-02 DIAGNOSIS — R9089 Other abnormal findings on diagnostic imaging of central nervous system: Secondary | ICD-10-CM | POA: Diagnosis not present

## 2023-07-02 DIAGNOSIS — Z79811 Long term (current) use of aromatase inhibitors: Secondary | ICD-10-CM | POA: Diagnosis not present

## 2023-07-02 DIAGNOSIS — Z5112 Encounter for antineoplastic immunotherapy: Secondary | ICD-10-CM | POA: Diagnosis not present

## 2023-07-02 DIAGNOSIS — Z170 Estrogen receptor positive status [ER+]: Secondary | ICD-10-CM | POA: Diagnosis not present

## 2023-07-02 DIAGNOSIS — Z79620 Long term (current) use of immunosuppressive biologic: Secondary | ICD-10-CM | POA: Diagnosis not present

## 2023-07-02 DIAGNOSIS — Z79899 Other long term (current) drug therapy: Secondary | ICD-10-CM | POA: Diagnosis not present

## 2023-07-02 DIAGNOSIS — Z803 Family history of malignant neoplasm of breast: Secondary | ICD-10-CM | POA: Diagnosis not present

## 2023-07-02 DIAGNOSIS — Z800 Family history of malignant neoplasm of digestive organs: Secondary | ICD-10-CM | POA: Diagnosis not present

## 2023-07-02 DIAGNOSIS — E785 Hyperlipidemia, unspecified: Secondary | ICD-10-CM | POA: Diagnosis not present

## 2023-07-02 DIAGNOSIS — R29818 Other symptoms and signs involving the nervous system: Secondary | ICD-10-CM | POA: Diagnosis not present

## 2023-07-02 DIAGNOSIS — M47812 Spondylosis without myelopathy or radiculopathy, cervical region: Secondary | ICD-10-CM | POA: Diagnosis not present

## 2023-07-02 DIAGNOSIS — Z823 Family history of stroke: Secondary | ICD-10-CM | POA: Diagnosis not present

## 2023-07-02 DIAGNOSIS — I7090 Unspecified atherosclerosis: Secondary | ICD-10-CM | POA: Diagnosis not present

## 2023-07-02 DIAGNOSIS — C50412 Malignant neoplasm of upper-outer quadrant of left female breast: Secondary | ICD-10-CM | POA: Diagnosis not present

## 2023-07-02 DIAGNOSIS — Z1721 Progesterone receptor positive status: Secondary | ICD-10-CM | POA: Diagnosis not present

## 2023-07-02 DIAGNOSIS — M797 Fibromyalgia: Secondary | ICD-10-CM | POA: Diagnosis not present

## 2023-07-02 DIAGNOSIS — I10 Essential (primary) hypertension: Secondary | ICD-10-CM | POA: Diagnosis not present

## 2023-07-02 DIAGNOSIS — Z1731 Human epidermal growth factor receptor 2 positive status: Secondary | ICD-10-CM | POA: Diagnosis not present

## 2023-07-02 DIAGNOSIS — Z8051 Family history of malignant neoplasm of kidney: Secondary | ICD-10-CM | POA: Diagnosis not present

## 2023-07-02 DIAGNOSIS — R930 Abnormal findings on diagnostic imaging of skull and head, not elsewhere classified: Secondary | ICD-10-CM | POA: Diagnosis not present

## 2023-07-02 DIAGNOSIS — Z841 Family history of disorders of kidney and ureter: Secondary | ICD-10-CM | POA: Diagnosis not present

## 2023-07-02 MED ORDER — TOPIRAMATE 50 MG PO TABS: 50.0000 mg | ORAL_TABLET | Freq: Every day | ORAL | 2 refills | Status: DC

## 2023-07-02 MED ORDER — PREDNISONE 50 MG PO TABS: 50.0000 mg | ORAL_TABLET | Freq: Every day | ORAL | 0 refills | Status: DC

## 2023-07-02 NOTE — Telephone Encounter (Signed)
PC to patient, her head CT is scheduled for 07/09/23 at 8:30 am at [facility], she is to arrive at 8:15.  She verbalizes understanding.

## 2023-07-02 NOTE — Progress Notes (Signed)
[facility] [address]  [place], [name] [address] [phone]   New Patient Evaluation  Date of Service: 07/02/23 Patient Name: Christy Lynch Patient MRN: [id number] Patient DOB: 21-Nov-1955 Provider: Henreitta Leber, MD  Identifying Statement:  Christy Lynch is a 68 y.o. female with Abnormal brain MRI who presents for initial consultation and evaluation regarding cancer associated neurologic deficits.    Referring Provider: [name], MD [address] [place],  [name] [address]  Primary Cancer:  Oncologic History: Oncology History  Malignant neoplasm of upper-outer quadrant of left breast in female, estrogen receptor positive (HCC)  04/30/2022 Initial Diagnosis   Screening mammogram to treat left breast mass measuring 0.7 cm, adjacent mass 0.5 cm, biopsy of both revealed grade 3 IDC with DCIS ER 100% PR 100% Ki-67 60%, HER2 3+ positive   05/25/2022 Cancer Staging   Staging form: Breast, AJCC 8th Edition - Clinical: Stage IA (cT1b, cN0, cM0, G3, ER+, PR+, HER2+) - Signed by [name], MD on 05/25/2022 Stage prefix: Initial diagnosis Histologic grading system: 3 grade system   07/19/2022 Surgery   Right lumpectomy: Grade 2 IDC, 1 cm with intermediate grade DCIS, ER 95%, PR 90%,, Ki-67 5%, HER2 -, 0/2 lymph nodes negative, anterior margin positive Left lumpectomy: Grade 3 IDC 1.1 cm with DCIS, margins negative, 0/2 lymph nodes negative, ER 100%, PR 100%, HER2 positive 3+, Ki-67 60%   07/29/2022 Genetic Testing   Negative genetic testing on the CancerNext-Expanded+RNAinsight panel.  The report date is July 29, 2022.  The CancerNext-Expanded gene panel offered by [facility] and includes sequencing and rearrangement analysis for the following 77 genes: AIP, ALK, APC*, ATM*, AXIN2, BAP1, BARD1, BMPR1A, BRCA1*, BRCA2*, BRIP1*, CDC73, CDH1*, CDK4, CDKN1B, CDKN2A, CHEK2*, CTNNA1, DICER1, FH, FLCN, KIF1B, LZTR1, MAX, MEN1, MET,  MLH1*, MSH2*, MSH3, MSH6*, MUTYH*, NF1*, NF2, NTHL1, PALB2*, PHOX2B, PMS2*, POT1, PRKAR1A, PTCH1, PTEN*, RAD51C*, RAD51D*, RB1, RET, SDHA, SDHAF2, SDHB, SDHC, SDHD, SMAD4, SMARCA4, SMARCB1, SMARCE1, STK11, SUFU, TMEM127, TP53*, TSC1, TSC2, and VHL (sequencing and deletion/duplication); EGFR, EGLN1, HOXB13, KIT, MITF, PDGFRA, POLD1, and POLE (sequencing only); EPCAM and GREM1 (deletion/duplication only). DNA and RNA analyses performed for * genes.    08/14/2022 -  Chemotherapy   Patient is on Treatment Plan : BREAST Paclitaxel + Trastuzumab q7d / Trastuzumab q21d     12/18/2022 - 01/16/2023 Radiation Therapy   First Treatment Date: 2022-12-18 - Last Treatment Date: 2023-01-16   Plan Name: Breast_L Site: Breast, Left Technique: 3D Mode: Photon Dose Per Fraction: 2.66 Gy Prescribed Dose (Delivered / Prescribed): 42.56 Gy / 42.56 Gy Prescribed Fxs (Delivered / Prescribed): 16 / 16   Plan Name: Breast_L_Bst Site: Breast, Left Technique: 3D Mode: Photon Dose Per Fraction: 2 Gy Prescribed Dose (Delivered / Prescribed): 8 Gy / 8 Gy Prescribed Fxs (Delivered / Prescribed): 4 / 4   Plan Name: Breast_R Site: Breast, Right Technique: 3D Mode: Photon Dose Per Fraction: 2.66 Gy Prescribed Dose (Delivered / Prescribed): 42.56 Gy / 42.56 Gy Prescribed Fxs (Delivered / Prescribed): 16 / 16   Plan Name: Breast_R_Bst Site: Breast, Right Technique: 3D Mode: Photon Dose Per Fraction: 2 Gy Prescribed Dose (Delivered / Prescribed): 8 Gy / 8 Gy Prescribed Fxs (Delivered / Prescribed): 4 / 4   02/2023 -  Anti-estrogen oral therapy   Anastrozole daily     History of Present Illness: The patient's records from the referring physician were obtained and reviewed and the patient interviewed to confirm this  HPI.  [name] presents today to review recent headaches.  She describes ~2 months history of moderate intensity daily headaches, lasting up to hours.  Pain is a pressure on the left side of  the head, no photophobia or nausea.  She has been dosing Valium which was initially prescribed for vertigo, no other pain medication.  No other symptoms, no intense/severe headaches.  Does have remote history of migraines.  Sleep has been normal.  Medications: Current Outpatient Medications on File Prior to Visit  Medication Sig Dispense Refill   anastrozole (ARIMIDEX) 1 MG tablet Take 1 tablet (1 mg total) by mouth daily. 90 tablet 3   aspirin EC 81 MG tablet Take 81 mg by mouth daily. Swallow whole.     atenolol (TENORMIN) 50 MG tablet Take 50 mg by mouth every evening.     chlorhexidine (PERIDEX) 0.12 % solution 15 mLs 2 (two) times daily.     DULoxetine (CYMBALTA) 60 MG capsule Take 60 mg by mouth every evening.     esomeprazole (NEXIUM) 40 MG capsule Take 40 mg by mouth daily at 12 noon.     lidocaine (XYLOCAINE) 2 % solution 15 mLs.     lidocaine-prilocaine (EMLA) cream APPLY TO AFFECTED AREA ONCE AS DIRECTED 30 g 0   lisinopril-hydrochlorothiazide (ZESTORETIC) 20-25 MG tablet Take 1 tablet by mouth daily.     Multiple Vitamin (MULTIVITAMIN WITH MINERALS) TABS tablet Take 1 tablet by mouth every evening.     No current facility-administered medications on file prior to visit.    Allergies:  Allergies  Allergen Reactions   Flagyl [Metronidazole] Nausea And Vomiting and Other (See Comments)    headache   Past Medical History:  Past Medical History:  Diagnosis Date   Breast Cancer    Left   Bursitis of hip    Esophageal reflux    Family history of breast cancer    Family history of colon cancer    Family history of kidney cancer    Fibromyalgia    Hypertension    Other and unspecified hyperlipidemia    Past Surgical History:  Past Surgical History:  Procedure Laterality Date   ABDOMINAL HYSTERECTOMY     BREAST BIOPSY Left    neg bx early 2000's   BREAST BIOPSY Left 04/30/2022   [place] LT BREAST BX W LOC DEV 1ST LESION IMG BX SPEC US GUIDE 04/30/2022 [facility]    BREAST BIOPSY Left 04/30/2022   [place] LT BREAST BX W LOC DEV EA ADD LESION IMG BX SPEC US GUIDE 04/30/2022 [facility]   BREAST BIOPSY  07/17/2022   MM LT RADIOACTIVE SEED LOC MAMMO GUIDE 07/17/2022 [facility]   BREAST BIOPSY  07/17/2022   MM LT RADIOACTIVE SEED EA ADD LESION LOC MAMMO GUIDE 07/17/2022 [facility]   BREAST BIOPSY  07/17/2022   MM RT RADIOACTIVE SEED LOC MAMMO GUIDE 07/17/2022 [facility]   BREAST LUMPECTOMY WITH RADIOACTIVE SEED AND SENTINEL LYMPH NODE BIOPSY Bilateral 07/19/2022   Procedure: BILATERAL BREAST LUMPECTOMY WITH RADIOACTIVE SEED AND BILATERAL SENTINEL LYMPH NODE BIOPSY;  Surgeon: Almond Lint, MD;  Location: [facility];  Service: General;  Laterality: Bilateral;   LEFT HEART CATH  12/12/2006   PORTACATH PLACEMENT Left 07/19/2022   Procedure: INSERTION PORT-A-CATH;  Surgeon: Almond Lint, MD;  Location: [facility];  Service: General;  Laterality: Left;   Social History:  Social History   Socioeconomic History   Marital status: Married    Spouse name:  Not on file   Number of children: Not on file   Years of education: Not on file   Highest education level: Not on file  Occupational History   Not on file  Tobacco Use   Smoking status: Former   Smokeless tobacco: Never  Vaping Use   Vaping status: Never Used  Substance and Sexual Activity   Alcohol use: No   Drug use: No   Sexual activity: Not on file  Other Topics Concern   Not on file  Social History Narrative   Not on file   Social Drivers of Health   Financial Resource Strain: Not on file  Food Insecurity: No Food Insecurity (10/23/2022)   Hunger Vital Sign    Worried About Running Out of Food in the Last Year: Never true    Ran Out of Food in the Last Year: Never true  Transportation Needs: No Transportation Needs (10/23/2022)   PRAPARE - Administrator, Civil Service (Medical): No    Lack of Transportation (Non-Medical): No  Physical  Activity: Not on file  Stress: Not on file  Social Connections: Not on file  Intimate Partner Violence: Not At Risk (10/23/2022)   Humiliation, Afraid, Rape, and Kick questionnaire    Fear of Current or Ex-Partner: No    Emotionally Abused: No    Physically Abused: No    Sexually Abused: No   Family History:  Family History  Problem Relation Age of Onset   Breast cancer Mother 19   Hypertension Mother    Stroke Mother    Kidney disease Father    Kidney cancer Brother 31   Colon cancer Maternal Uncle 72   Breast cancer Cousin        maternal first cousin, dx 30s    Review of Systems: Constitutional: Doesn't report fevers, chills or abnormal weight loss Eyes: Doesn't report blurriness of vision Ears, nose, mouth, throat, and face: Doesn't report sore throat Respiratory: Doesn't report cough, dyspnea or wheezes Cardiovascular: Doesn't report palpitation, chest discomfort  Gastrointestinal:  Doesn't report nausea, constipation, diarrhea GU: Doesn't report incontinence Skin: Doesn't report skin rashes Neurological: Per HPI Musculoskeletal: Doesn't report joint pain Behavioral/Psych: Doesn't report anxiety  Physical Exam: Vitals:   07/02/23 1446  BP: (!) 153/78  Pulse: 72  Resp: 15  Temp: 97.9 F (36.6 C)  SpO2: 97%   KPS: 90. General: Alert, cooperative, pleasant, in no acute distress Head: Normal EENT: No conjunctival injection or scleral icterus.  Lungs: Resp effort normal Cardiac: Regular rate Abdomen: Non-distended abdomen Skin: No rashes cyanosis or petechiae. Extremities: No clubbing or edema  Neurologic Exam: Mental Status: Awake, alert, attentive to examiner. Oriented to self and environment. Language is fluent with intact comprehension.  Cranial Nerves: Visual acuity is grossly normal. Visual fields are full. Extra-ocular movements intact. No ptosis. Face is symmetric Motor: Tone and bulk are normal. Power is full in both arms and legs. Reflexes are  symmetric, no pathologic reflexes present.  Sensory: Intact to light touch Gait: Normal.   Labs: I have reviewed the data as listed    Component Value Date/Time   NA 137 06/04/2023 0850   K 3.7 06/04/2023 0850   CL 101 06/04/2023 0850   CO2 29 06/04/2023 0850   GLUCOSE 90 06/04/2023 0850   BUN 14 06/04/2023 0850   CREATININE 1.03 (H) 06/04/2023 0850   CALCIUM 9.9 06/04/2023 0850   PROT 7.4 06/04/2023 0850   ALBUMIN 4.0 06/04/2023 0850   AST 12 (  L) 06/04/2023 0850   ALT 9 06/04/2023 0850   ALKPHOS 116 06/04/2023 0850   BILITOT 0.4 06/04/2023 0850   GFRNONAA 60 (L) 06/04/2023 0850   GFRAA 81 (L) 03/23/2013 1443   Lab Results  Component Value Date   WBC 6.9 06/04/2023   NEUTROABS 4.9 06/04/2023   HGB 10.4 (L) 06/04/2023   HCT 32.3 (L) 06/04/2023   MCV 85.2 06/04/2023   PLT 385 06/04/2023    Imaging:  MR Brain W Wo Contrast Result Date: 06/25/2023 CLINICAL DATA:  Provided history: Malignant neoplasm of upper-outer quadrant of left breast in female, estrogen receptor positive. Frequent headaches. Headache, new onset. Breast cancer history with headaches. EXAM: MRI HEAD WITHOUT AND WITH CONTRAST TECHNIQUE: Multiplanar, multiecho pulse sequences of the brain and surrounding structures were obtained without and with intravenous contrast. CONTRAST:  10mL GADAVIST GADOBUTROL 1 MMOL/ML IV SOLN COMPARISON:  Report from brain MRI 07/14/2013 (images currently unavailable). FINDINGS: Brain: No age-advanced or lobar predominant cerebral atrophy. 1.2 x 0.8 cm T2 hypointense and avidly enhancing focus in the right paraclinoid region (for instance as seen on series 102, image 128) (series 16, image 62). Multifocal T2 FLAIR hyperintense signal abnormality within the cerebral white matter, nonspecific but compatible with mild chronic small vessel ischemic disease. There is no acute infarct. No chronic intracranial blood products. No extra-axial fluid collection. No midline shift. Vascular:  Maintained flow voids within the proximal large arterial vessels. 1.2 x 0.8 cm avidly enhancing focus in the right paraclinoid region as described elsewhere. Skull and upper cervical spine: No focal worrisome marrow lesion. Incompletely assessed cervical spondylosis. Sinuses/Orbits: No mass or acute finding within the imaged orbits. No significant paranasal sinus disease. Impression #1 will be called to the ordering clinician or representative by the Radiologist Assistant, and communication documented in the PACS or [employer]. IMPRESSION: 1. 1.2 x 0.8 cm avidly enhancing focus in the right paraclinoid region. This is most suspicious for an arterial aneurysm. However alternatively, this could reflect a dural-based mass (such as a meningioma or dural-based metastasis). MR or CT angiography of the head is recommended for further evaluation. 2. Mild chronic small vessel ischemic changes within the cerebral white matter. Electronically Signed   By: Jackey Loge D.O.   On: 06/25/2023 14:10     Assessment/Plan Abnormal brain MRI  [place] presents with daily tension-type headaches.  Etiology is unclear at this time.    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  This was reviewed in CNS tumor board.  Recommended CT angio and referral to vascular neurosurgery for further evaluation, who may recommend it be clipped, coiled, or diverted.  She is agreeable with this workup and referral.  Do not strongly suspect the headaches are secondary to this incidental finding, which is also contralateral to the pain.  For chronic component of headache, recommended short course of prednisone 50mg  daily x7 days.  Also recommended trial of Topamax 50mg  daily for headache prevention.  We spent twenty additional minutes teaching regarding the natural history, biology, and historical experience in the treatment of neurologic complications of cancer.   We appreciate the opportunity to participate  in the care of [name].    We ask that [name] return to clinic in 1 week via phone to assess response to intervention and discuss CT results, or sooner as needed.  All questions were answered. The patient knows to call the clinic with any problems, questions or concerns. No barriers to learning were detected.  The total time spent in the encounter was 40 minutes and more than 50% was on counseling and review of test results   Henreitta Leber, MD Medical Director of Neuro-Oncology [facility] [place] 07/02/23 2:48 PM

## 2023-07-02 NOTE — Telephone Encounter (Signed)
Referral sent to [facility] Neurosurgery, Dr Conchita Paris.  Copy of demographic sheet, referral, today's office visit note & MRI report from 06/25/23 faxed to [phone], fax confirmation received.

## 2023-07-03 ENCOUNTER — Telehealth: Payer: Self-pay | Admitting: Internal Medicine

## 2023-07-03 ENCOUNTER — Other Ambulatory Visit (HOSPITAL_COMMUNITY): Payer: Self-pay

## 2023-07-03 NOTE — Telephone Encounter (Signed)
Completed - Scheduled patient's appointment per provider request.

## 2023-07-05 ENCOUNTER — Ambulatory Visit (HOSPITAL_COMMUNITY)
Admission: RE | Admit: 2023-07-05 | Discharge: 2023-07-05 | Disposition: A | Discharge status: Home / Self Care | Payer: Medicare Other | Source: Ambulatory Visit | Attending: Internal Medicine | Admitting: Internal Medicine

## 2023-07-05 DIAGNOSIS — R9089 Other abnormal findings on diagnostic imaging of central nervous system: Secondary | ICD-10-CM | POA: Diagnosis not present

## 2023-07-05 DIAGNOSIS — I671 Cerebral aneurysm, nonruptured: Secondary | ICD-10-CM | POA: Diagnosis not present

## 2023-07-05 DIAGNOSIS — I6522 Occlusion and stenosis of left carotid artery: Secondary | ICD-10-CM | POA: Diagnosis not present

## 2023-07-05 MED ORDER — IOHEXOL 350 MG/ML SOLN
75.0000 mL | Freq: Once | INTRAVENOUS | Status: AC | PRN
  Administered 2023-07-05: 75 mL via INTRAVENOUS

## 2023-07-08 ENCOUNTER — Ambulatory Visit: Payer: Medicare Other | Admitting: Internal Medicine

## 2023-07-08 ENCOUNTER — Other Ambulatory Visit: Payer: Self-pay

## 2023-07-09 ENCOUNTER — Inpatient Hospital Stay: Payer: Medicare Other | Admitting: Internal Medicine

## 2023-07-09 DIAGNOSIS — R9089 Other abnormal findings on diagnostic imaging of central nervous system: Secondary | ICD-10-CM | POA: Diagnosis not present

## 2023-07-09 NOTE — Progress Notes (Signed)
 I connected with [name] on 07/09/23 at 11:15 AM EST by telephone visit and verified that I am speaking with the correct person using two identifiers.  I discussed the limitations, risks, security and privacy concerns of performing an evaluation and management service by telemedicine and the availability of in-person appointments. I also discussed with the patient that there may be a patient responsible charge related to this service. The patient expressed understanding and agreed to proceed.  Other persons participating in the visit and their role in the encounter:  n/a   Patient's location:  Home Provider's location:  Office Chief Complaint:  Abnormal brain MRI  History of Present Ilness: Christy Lynch reports considerable improvement in her headache burden since dosing the prednisone and starting Topamax.  No issues or side effects reported.  Completed the CT angio without issue, no visit yet with neuorsurgery.  Observations: Language and cognition at baseline  Imaging:  CT ANGIO HEAD W OR WO CONTRAST Result Date: 07/05/2023 CLINICAL DATA:  Cerebral aneurysm, untreated. Left-sided headache x1 month. EXAM: CT ANGIOGRAPHY HEAD WITHOUT AND WITH CONTRAST TECHNIQUE: Multidetector CT imaging of the head was performed using the standard protocol before and after bolus administration of intravenous contrast. Multiplanar CT image reconstructions and MIPs were obtained to evaluate the vascular anatomy. RADIATION DOSE REDUCTION: This exam was performed according to the departmental dose-optimization program which includes automated exposure control, adjustment of the mA and/or kV according to patient size and/or use of iterative reconstruction technique. CONTRAST:  75mL OMNIPAQUE IOHEXOL 350 MG/ML SOLN COMPARISON:  MRI brain 06/25/2023. FINDINGS: CT HEAD Brain: No acute intracranial hemorrhage. Gray-white differentiation is preserved. No hydrocephalus or extra-axial collection. No mass  effect or midline shift. Vascular: No hyperdense vessel or unexpected calcification. Skull: No calvarial fracture or suspicious bone lesion. Skull base is unremarkable. Sinuses: No acute or significant finding. Other: None. 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 Mild atherosclerotic calcifications of the left carotid siphon without hemodynamically significant stenosis or aneurysm. The proximal ACAs and MCAs are patent without stenosis or aneurysm. Distal branches are symmetric. Posterior circulation: Visualized portions of the distal vertebral arteries and basilar artery are patent without stenosis or aneurysm. The SCAs, AICAs and PICAs are patent proximally. The PCAs are patent proximally without stenosis or aneurysm. Distal branches are symmetric. Venous sinuses: As permitted by contrast timing, patent. Anatomic variants: Persistent fetal origin of the left PCA with hypoplastic left P1 segment. Review of the MIP images confirms the above findings. IMPRESSION: 1. 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 2. No acute intracranial abnormality. Electronically Signed   By: Orvan Falconer M.D.   On: 07/05/2023 09:23   MR Brain W Wo Contrast Result Date: 06/25/2023 CLINICAL DATA:  Provided history: Malignant neoplasm of upper-outer quadrant of left breast in female, estrogen receptor positive. Frequent headaches. Headache, new onset. Breast cancer history with headaches. EXAM: MRI HEAD WITHOUT AND WITH CONTRAST TECHNIQUE: Multiplanar, multiecho pulse sequences of the brain and surrounding structures were obtained without and with intravenous contrast. CONTRAST:  10mL  GADAVIST GADOBUTROL 1 MMOL/ML IV SOLN COMPARISON:  Report from brain MRI 07/14/2013 (images currently unavailable). FINDINGS: Brain: No age-advanced or  lobar predominant cerebral atrophy. 1.2 x 0.8 cm T2 hypointense and avidly enhancing focus in the right paraclinoid region (for instance as seen on series 102, image 128) (series 16, image 62). Multifocal T2 FLAIR hyperintense signal abnormality within the cerebral white matter, nonspecific but compatible with mild chronic small vessel ischemic disease. There is no acute infarct. No chronic intracranial blood products. No extra-axial fluid collection. No midline shift. Vascular: Maintained flow voids within the proximal large arterial vessels. 1.2 x 0.8 cm avidly enhancing focus in the right paraclinoid region as described elsewhere. Skull and upper cervical spine: No focal worrisome marrow lesion. Incompletely assessed cervical spondylosis. Sinuses/Orbits: No mass or acute finding within the imaged orbits. No significant paranasal sinus disease. Impression #1 will be called to the ordering clinician or representative by the Radiologist Assistant, and communication documented in the PACS or [employer]. IMPRESSION: 1. 1.2 x 0.8 cm avidly enhancing focus in the right paraclinoid region. This is most suspicious for an arterial aneurysm. However alternatively, this could reflect a dural-based mass (such as a meningioma or dural-based metastasis). MR or CT angiography of the head is recommended for further evaluation. 2. Mild chronic small vessel ischemic changes within the cerebral white matter. Electronically Signed   By: Jackey Loge D.O.   On: 06/25/2023 14:10   Assessment and Plan: Abnormal brain MRI  Clinically improved from headache standpoint.  She will discontinue the steroids and continue with Topamax 50mg  daily.  CT angio demonstrates intracranial aneurysm, just under 1cm diameter, remains asymptomatic.  Referral has been placed for clinical  evaluation with neurosurgery, pending.  Follow Up Instructions: RTC in 6-8 weeks for headache follow up.  I discussed the assessment and treatment plan with the patient.  The patient was provided an opportunity to ask questions and all were answered.  The patient agreed with the plan and demonstrated understanding of the instructions.    The patient was advised to call back or seek an in-person evaluation if the symptoms worsen or if the condition fails to improve as anticipated.    Henreitta Leber, MD   I provided 20 minutes of non face-to-face telephone visit time during this encounter, and > 50% was spent counseling as documented under my assessment & plan.

## 2023-07-10 ENCOUNTER — Telehealth: Payer: Self-pay | Admitting: Internal Medicine

## 2023-07-10 NOTE — Telephone Encounter (Signed)
 Christy Lynch

## 2023-07-11 ENCOUNTER — Other Ambulatory Visit: Payer: Self-pay

## 2023-07-11 DIAGNOSIS — C50412 Malignant neoplasm of upper-outer quadrant of left female breast: Secondary | ICD-10-CM | POA: Diagnosis not present

## 2023-07-12 ENCOUNTER — Encounter: Payer: Self-pay | Admitting: *Deleted

## 2023-07-15 ENCOUNTER — Encounter: Payer: Self-pay | Admitting: Adult Health

## 2023-07-15 ENCOUNTER — Other Ambulatory Visit: Payer: Medicare Other

## 2023-07-15 ENCOUNTER — Other Ambulatory Visit: Payer: Self-pay | Admitting: General Surgery

## 2023-07-15 ENCOUNTER — Other Ambulatory Visit: Payer: Self-pay

## 2023-07-15 ENCOUNTER — Inpatient Hospital Stay: Payer: Medicare Other | Attending: Hematology and Oncology | Admitting: Adult Health

## 2023-07-15 ENCOUNTER — Encounter

## 2023-07-15 VITALS — BP 112/54 | HR 64 | Temp 97.4°F | Resp 16 | Ht 67.0 in | Wt 227.3 lb

## 2023-07-15 DIAGNOSIS — Z90710 Acquired absence of both cervix and uterus: Secondary | ICD-10-CM | POA: Insufficient documentation

## 2023-07-15 DIAGNOSIS — N6320 Unspecified lump in the left breast, unspecified quadrant: Secondary | ICD-10-CM | POA: Insufficient documentation

## 2023-07-15 DIAGNOSIS — Z79899 Other long term (current) drug therapy: Secondary | ICD-10-CM | POA: Insufficient documentation

## 2023-07-15 DIAGNOSIS — Z803 Family history of malignant neoplasm of breast: Secondary | ICD-10-CM | POA: Insufficient documentation

## 2023-07-15 DIAGNOSIS — Z823 Family history of stroke: Secondary | ICD-10-CM | POA: Diagnosis not present

## 2023-07-15 DIAGNOSIS — Z170 Estrogen receptor positive status [ER+]: Secondary | ICD-10-CM | POA: Insufficient documentation

## 2023-07-15 DIAGNOSIS — Z1731 Human epidermal growth factor receptor 2 positive status: Secondary | ICD-10-CM | POA: Insufficient documentation

## 2023-07-15 DIAGNOSIS — C50412 Malignant neoplasm of upper-outer quadrant of left female breast: Secondary | ICD-10-CM | POA: Diagnosis not present

## 2023-07-15 DIAGNOSIS — C50411 Malignant neoplasm of upper-outer quadrant of right female breast: Secondary | ICD-10-CM

## 2023-07-15 DIAGNOSIS — Z79811 Long term (current) use of aromatase inhibitors: Secondary | ICD-10-CM | POA: Diagnosis not present

## 2023-07-15 DIAGNOSIS — Z800 Family history of malignant neoplasm of digestive organs: Secondary | ICD-10-CM | POA: Diagnosis not present

## 2023-07-15 DIAGNOSIS — Z79620 Long term (current) use of immunosuppressive biologic: Secondary | ICD-10-CM | POA: Insufficient documentation

## 2023-07-15 DIAGNOSIS — Z5112 Encounter for antineoplastic immunotherapy: Secondary | ICD-10-CM | POA: Insufficient documentation

## 2023-07-15 DIAGNOSIS — Z881 Allergy status to other antibiotic agents status: Secondary | ICD-10-CM | POA: Insufficient documentation

## 2023-07-15 DIAGNOSIS — R519 Headache, unspecified: Secondary | ICD-10-CM | POA: Diagnosis not present

## 2023-07-15 DIAGNOSIS — Z1721 Progesterone receptor positive status: Secondary | ICD-10-CM | POA: Diagnosis not present

## 2023-07-15 DIAGNOSIS — I10 Essential (primary) hypertension: Secondary | ICD-10-CM | POA: Diagnosis not present

## 2023-07-15 DIAGNOSIS — Z8379 Family history of other diseases of the digestive system: Secondary | ICD-10-CM | POA: Insufficient documentation

## 2023-07-15 DIAGNOSIS — Z9189 Other specified personal risk factors, not elsewhere classified: Secondary | ICD-10-CM | POA: Diagnosis not present

## 2023-07-15 DIAGNOSIS — Z79810 Long term (current) use of selective estrogen receptor modulators (SERMs): Secondary | ICD-10-CM | POA: Insufficient documentation

## 2023-07-15 DIAGNOSIS — G629 Polyneuropathy, unspecified: Secondary | ICD-10-CM | POA: Diagnosis not present

## 2023-07-15 MED ORDER — LETROZOLE 2.5 MG PO TABS: 2.5000 mg | ORAL_TABLET | Freq: Every day | ORAL | 3 refills | Status: AC

## 2023-07-15 NOTE — Progress Notes (Signed)
 SURVIVORSHIP VISIT:  BRIEF ONCOLOGIC HISTORY:  Oncology History  Malignant neoplasm of upper-outer quadrant of left breast in female, estrogen receptor positive (HCC)  04/30/2022 Initial Diagnosis   Screening mammogram to treat left breast mass measuring 0.7 cm, adjacent mass 0.5 cm, biopsy of both revealed grade 3 IDC with DCIS ER 100% PR 100% Ki-67 60%, HER2 3+ positive   05/25/2022 Cancer Staging   Staging form: Breast, AJCC 8th Edition - Clinical: Stage IA (cT1b, cN0, cM0, G3, ER+, PR+, HER2+) - Signed by [name], MD on 05/25/2022 Stage prefix: Initial diagnosis Histologic grading system: 3 grade system   07/19/2022 Surgery   Right lumpectomy: Grade 2 IDC, 1 cm with intermediate grade DCIS, ER 95%, PR 90%,, Ki-67 5%, HER2 -, 0/2 lymph nodes negative, anterior margin positive Left lumpectomy: Grade 3 IDC 1.1 cm with DCIS, margins negative, 0/2 lymph nodes negative, ER 100%, PR 100%, HER2 positive 3+, Ki-67 60%   07/19/2022 Cancer Staging   Staging form: Breast, AJCC 8th Edition - Pathologic stage from 07/19/2022: Stage IA (pT1c, pN0, cM0, G3, ER+, PR+, HER2+) - Signed by [name], NP on 07/15/2023 Histologic grading system: 3 grade system   07/29/2022 Genetic Testing   Negative genetic testing on the CancerNext-Expanded+RNAinsight panel.  The report date is July 29, 2022.  The CancerNext-Expanded gene panel offered by [facility] and includes sequencing and rearrangement analysis for the following 77 genes: AIP, ALK, APC*, ATM*, AXIN2, BAP1, BARD1, BMPR1A, BRCA1*, BRCA2*, BRIP1*, CDC73, CDH1*, CDK4, CDKN1B, CDKN2A, CHEK2*, CTNNA1, DICER1, FH, FLCN, KIF1B, LZTR1, MAX, MEN1, MET, MLH1*, MSH2*, MSH3, MSH6*, MUTYH*, NF1*, NF2, NTHL1, PALB2*, PHOX2B, PMS2*, POT1, PRKAR1A, PTCH1, PTEN*, RAD51C*, RAD51D*, RB1, RET, SDHA, SDHAF2, SDHB, SDHC, SDHD, SMAD4, SMARCA4, SMARCB1, SMARCE1, STK11, SUFU, TMEM127, TP53*, TSC1, TSC2, and VHL (sequencing and deletion/duplication); EGFR, EGLN1,  HOXB13, KIT, MITF, PDGFRA, POLD1, and POLE (sequencing only); EPCAM and GREM1 (deletion/duplication only). DNA and RNA analyses performed for * genes.    08/14/2022 -  Chemotherapy   Patient is on Treatment Plan : BREAST Paclitaxel + Trastuzumab q7d / Trastuzumab q21d     12/18/2022 - 01/16/2023 Radiation Therapy   First Treatment Date: 2022-12-18 - Last Treatment Date: 2023-01-16   Plan Name: Breast_L Site: Breast, Left Technique: 3D Mode: Photon Dose Per Fraction: 2.66 Gy Prescribed Dose (Delivered / Prescribed): 42.56 Gy / 42.56 Gy Prescribed Fxs (Delivered / Prescribed): 16 / 16   Plan Name: Breast_L_Bst Site: Breast, Left Technique: 3D Mode: Photon Dose Per Fraction: 2 Gy Prescribed Dose (Delivered / Prescribed): 8 Gy / 8 Gy Prescribed Fxs (Delivered / Prescribed): 4 / 4   Plan Name: Breast_R Site: Breast, Right Technique: 3D Mode: Photon Dose Per Fraction: 2.66 Gy Prescribed Dose (Delivered / Prescribed): 42.56 Gy / 42.56 Gy Prescribed Fxs (Delivered / Prescribed): 16 / 16   Plan Name: Breast_R_Bst Site: Breast, Right Technique: 3D Mode: Photon Dose Per Fraction: 2 Gy Prescribed Dose (Delivered / Prescribed): 8 Gy / 8 Gy Prescribed Fxs (Delivered / Prescribed): 4 / 4   02/2023 -  Anti-estrogen oral therapy   Anastrozole daily x 7 years   Malignant neoplasm of upper-outer quadrant of right breast in female, estrogen receptor positive (HCC)  07/19/2022 Cancer Staging   Staging form: Breast, AJCC 8th Edition - Pathologic stage from 07/19/2022: Stage IA (pT1b, pN0, cM0, G2, ER+, PR+, HER2-) - Signed by [name], NP on 07/15/2023 Stage prefix: Initial diagnosis Histologic grading system: 3 grade system   07/15/2023 Initial Diagnosis  Malignant neoplasm of upper-outer quadrant of right breast in female, estrogen receptor positive (HCC)     INTERVAL HISTORY:  [name] to review her survivorship care plan detailing her treatment course for breast cancer,  as well as monitoring long-term side effects of that treatment, education regarding health maintenance, screening, and overall wellness and health promotion.     Overall, [name] reports feeling well.  She stopped taking anastrozole due to significant arthralgias.  Since stopping the anastrozole she has been feeling better.  She is due to change to a different antiestrogen therapy.  She also completes Herceptin therapy tomorrow.  REVIEW OF SYSTEMS:  Review of Systems  Constitutional:  Negative for appetite change, chills, fatigue, fever and unexpected weight change.  HENT:   Negative for hearing loss, lump/mass and trouble swallowing.   Eyes:  Negative for eye problems and icterus.  Respiratory:  Negative for chest tightness, cough and shortness of breath.   Cardiovascular:  Negative for chest pain, leg swelling and palpitations.  Gastrointestinal:  Negative for abdominal distention, abdominal pain, constipation, diarrhea, nausea and vomiting.  Endocrine: Negative for hot flashes.  Genitourinary:  Negative for difficulty urinating.   Musculoskeletal:  Negative for arthralgias.  Skin:  Negative for itching and rash.  Neurological:  Negative for dizziness, extremity weakness, headaches and numbness.  Hematological:  Negative for adenopathy. Does not bruise/bleed easily.  Psychiatric/Behavioral:  Negative for depression. The patient is not nervous/anxious.   Breast: Denies any new nodularity, masses, tenderness, nipple changes, or nipple discharge.       PAST MEDICAL/SURGICAL HISTORY:  Past Medical History:  Diagnosis Date   Breast Cancer    Left   Bursitis of hip    Esophageal reflux    Family history of breast cancer    Family history of colon cancer    Family history of kidney cancer    Fibromyalgia    Hypertension    Other and unspecified hyperlipidemia    Past Surgical History:  Procedure Laterality Date   ABDOMINAL HYSTERECTOMY     BREAST BIOPSY Left    neg bx  early 2000's   BREAST BIOPSY Left 04/30/2022   [place] LT BREAST BX W LOC DEV 1ST LESION IMG BX SPEC US GUIDE 04/30/2022 [facility]   BREAST BIOPSY Left 04/30/2022   [place] LT BREAST BX W LOC DEV EA ADD LESION IMG BX SPEC US GUIDE 04/30/2022 [facility]   BREAST BIOPSY  07/17/2022   MM LT RADIOACTIVE SEED LOC MAMMO GUIDE 07/17/2022 [facility]   BREAST BIOPSY  07/17/2022   MM LT RADIOACTIVE SEED EA ADD LESION LOC MAMMO GUIDE 07/17/2022 [facility]   BREAST BIOPSY  07/17/2022   MM RT RADIOACTIVE SEED LOC MAMMO GUIDE 07/17/2022 [facility]   BREAST LUMPECTOMY WITH RADIOACTIVE SEED AND SENTINEL LYMPH NODE BIOPSY Bilateral 07/19/2022   Procedure: BILATERAL BREAST LUMPECTOMY WITH RADIOACTIVE SEED AND BILATERAL SENTINEL LYMPH NODE BIOPSY;  Surgeon: Almond Lint, MD;  Location: [facility];  Service: General;  Laterality: Bilateral;   LEFT HEART CATH  12/12/2006   PORTACATH PLACEMENT Left 07/19/2022   Procedure: INSERTION PORT-A-CATH;  Surgeon: Almond Lint, MD;  Location: [facility];  Service: General;  Laterality: Left;     ALLERGIES:  Allergies  Allergen Reactions   Flagyl [Metronidazole] Nausea And Vomiting and Other (See Comments)    headache     CURRENT MEDICATIONS:  Outpatient Encounter Medications as of 07/15/2023  Medication Sig   aspirin EC 81 MG tablet  Take 81 mg by mouth daily. Swallow whole.   atenolol (TENORMIN) 50 MG tablet Take 50 mg by mouth every evening.   chlorhexidine (PERIDEX) 0.12 % solution 15 mLs 2 (two) times daily.   diazepam (VALIUM) 2 MG tablet Take 2 mg by mouth 3 (three) times daily as needed.   DULoxetine (CYMBALTA) 60 MG capsule Take 60 mg by mouth every evening.   esomeprazole (NEXIUM) 40 MG capsule Take 40 mg by mouth daily at 12 noon.   letrozole (FEMARA) 2.5 MG tablet Take 1 tablet (2.5 mg total) by mouth daily.   lidocaine (XYLOCAINE) 2 % solution 15 mLs.   lidocaine-prilocaine (EMLA) cream APPLY TO AFFECTED  AREA ONCE AS DIRECTED   lisinopril-hydrochlorothiazide (ZESTORETIC) 20-25 MG tablet Take 1 tablet by mouth daily.   Multiple Vitamin (MULTIVITAMIN WITH MINERALS) TABS tablet Take 1 tablet by mouth every evening.   predniSONE (DELTASONE) 50 MG tablet Take 1 tablet (50 mg total) by mouth daily with breakfast.   topiramate (TOPAMAX) 50 MG tablet Take 1 tablet (50 mg total) by mouth daily.   [DISCONTINUED] anastrozole (ARIMIDEX) 1 MG tablet Take 1 tablet (1 mg total) by mouth daily.   No facility-administered encounter medications on file as of 07/15/2023.     ONCOLOGIC FAMILY HISTORY:  Family History  Problem Relation Age of Onset   Breast cancer Mother 57   Hypertension Mother    Stroke Mother    Kidney disease Father    Kidney cancer Brother 29   Colon cancer Maternal Uncle 1   Breast cancer Cousin        maternal first cousin, dx 48s     SOCIAL HISTORY:  Social History   Socioeconomic History   Marital status: Married    Spouse name: Not on file   Number of children: Not on file   Years of education: Not on file   Highest education level: Not on file  Occupational History   Not on file  Tobacco Use   Smoking status: Former   Smokeless tobacco: Never  Vaping Use   Vaping status: Never Used  Substance and Sexual Activity   Alcohol use: No   Drug use: No   Sexual activity: Not on file  Other Topics Concern   Not on file  Social History Narrative   Not on file   Social Drivers of Health   Financial Resource Strain: Not on file  Food Insecurity: No Food Insecurity (10/23/2022)   Hunger Vital Sign    Worried About Running Out of Food in the Last Year: Never true    Ran Out of Food in the Last Year: Never true  Transportation Needs: No Transportation Needs (10/23/2022)   PRAPARE - Administrator, Civil Service (Medical): No    Lack of Transportation (Non-Medical): No  Physical Activity: Not on file  Stress: Not on file  Social Connections: Not on file   Intimate Partner Violence: Not At Risk (10/23/2022)   Humiliation, Afraid, Rape, and Kick questionnaire    Fear of Current or Ex-Partner: No    Emotionally Abused: No    Physically Abused: No    Sexually Abused: No     OBSERVATIONS/OBJECTIVE:  BP (!) 112/54 (BP Location: Left Arm, Patient Position: Sitting)   Pulse 64   Temp (!) 97.4 F (36.3 C) (Temporal)   Resp 16   Ht 5\' 7"  (1.702 m)   Wt 227 lb 4.8 oz (103.1 kg)   SpO2 100%   BMI 35.60  kg/m  GENERAL: Patient is a well appearing female in no acute distress HEENT:  Sclerae anicteric.  Oropharynx clear and moist. No ulcerations or evidence of oropharyngeal candidiasis. Neck is supple.  NODES:  No cervical, supraclavicular, or axillary lymphadenopathy palpated.  BREAST EXAM: Bilateral breast status postlumpectomy and radiation no sign of local recurrence. LUNGS:  Clear to auscultation bilaterally.  No wheezes or rhonchi. HEART:  Regular rate and rhythm. No murmur appreciated. ABDOMEN:  Soft, nontender.  Positive, normoactive bowel sounds. No organomegaly palpated. MSK:  No focal spinal tenderness to palpation. Full range of motion bilaterally in the upper extremities. EXTREMITIES:  No peripheral edema.   SKIN:  Clear with no obvious rashes or skin changes. No nail dyscrasia. NEURO:  Nonfocal. Well oriented.  Appropriate affect.   LABORATORY DATA:  None for this visit.  DIAGNOSTIC IMAGING:  None for this visit.      ASSESSMENT AND PLAN:  Ms.. Lynch is a pleasant 68 y.o. 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  She presents to the Survivorship Clinic for our initial meeting and routine follow-up post-completion of treatment for breast cancer.    1. Bilateral breast cancer:  Ms. Noy  is continuing to recover from definitive treatment for breast cancer. She will follow-up with her medical oncologist, Dr. Pamelia Hoit tomorrow prior to her treatment with history and physical exam per surveillance protocol.  Placed orders for her to start taking letrozole daily we reviewed the risks and benefits in detail. Her mammogram is due this month; orders placed today.   Since her right sided breast cancer was mammographically occult she should undergo intensified screening with breast MRI.  Orders were placed for this to occur in September 2025.    Today, a comprehensive survivorship care plan and treatment summary was reviewed with the patient today detailing her breast cancer diagnosis, treatment course, potential late/long-term effects of treatment, appropriate follow-up care with recommendations for the future, and patient education resources.  A copy of this summary, along with a letter will be sent to the patient's primary care provider via mail/fax/In Basket message after today's visit.    2. Bone health:  Given Ms. Murray's age/history of breast cancer and her current treatment regimen including anti-estrogen therapy with Anastrozole, she is at risk for bone demineralization.  Bone density testing has been ordered, but not yet completed--my nurse is following up to help get this scheduled.  She was given education on specific activities to promote bone health.  3. Cancer screening:  Due to Ms. Monie's history and her age, she should receive screening for skin cancers, colon cancer, and gynecologic cancers.  The information and recommendations are listed on the patient's comprehensive care plan/treatment summary and were reviewed in detail with the patient.    4. Health maintenance and wellness promotion: Ms. Auvil was encouraged to consume 5-7 servings of fruits and vegetables per day. We reviewed the "Nutrition Rainbow" handout.  She was also encouraged to engage in moderate to  vigorous exercise for 30 minutes per day most days of the week.  She was instructed to limit her alcohol consumption and continue to abstain from tobacco use.     5. Support services/counseling: It is not uncommon for this period of the patient's cancer care trajectory to be one of many emotions and stressors.   She was given  information regarding our available services and encouraged to contact me with any questions or for help enrolling in any of our support group/programs.    Follow up instructions:    -Return to cancer center tomorrow for follow-up with Dr. Pamelia Hoit -Follow-up in 3 months with Dr. Pamelia Hoit or Lillard Anes -Mammogram due this month -Breast MRI in September 2025 -Port removal with Dr. Donell Beers, message sent -She is welcome to return back to the Survivorship Clinic at any time; no additional follow-up needed at this time.  -Consider referral back to survivorship as a long-term survivor for continued surveillance  The patient was provided an opportunity to ask questions and all were answered. The patient agreed with the plan and demonstrated an understanding of the instructions.   Total encounter time:55 minutes*in face-to-face visit time, chart review, lab review, care coordination, order entry, and documentation of the encounter time.    Lillard Anes, NP 07/15/23 2:59 PM Medical Oncology and Hematology [facility] [address] [place], [name] [address] Tel. [phone]    Fax. [phone]  *Total Encounter Time as defined by the Centers for Medicare and Medicaid Services includes, in addition to the face-to-face time of a patient visit (documented in the note above) non-face-to-face time: obtaining and reviewing outside history, ordering and reviewing medications, tests or procedures, care coordination (communications with other health care professionals or caregivers) and documentation in the medical record.

## 2023-07-16 ENCOUNTER — Other Ambulatory Visit: Payer: Medicare Other

## 2023-07-16 ENCOUNTER — Other Ambulatory Visit: Payer: Self-pay

## 2023-07-16 ENCOUNTER — Inpatient Hospital Stay: Payer: Medicare Other

## 2023-07-16 ENCOUNTER — Ambulatory Visit: Payer: Medicare Other

## 2023-07-16 ENCOUNTER — Encounter: Payer: Self-pay | Admitting: *Deleted

## 2023-07-16 ENCOUNTER — Inpatient Hospital Stay (HOSPITAL_BASED_OUTPATIENT_CLINIC_OR_DEPARTMENT_OTHER): Payer: Medicare Other | Admitting: Hematology and Oncology

## 2023-07-16 VITALS — BP 105/50 | HR 71 | Temp 97.9°F | Resp 18 | Ht 67.0 in | Wt 226.6 lb

## 2023-07-16 DIAGNOSIS — Z5112 Encounter for antineoplastic immunotherapy: Secondary | ICD-10-CM | POA: Diagnosis not present

## 2023-07-16 DIAGNOSIS — Z170 Estrogen receptor positive status [ER+]: Secondary | ICD-10-CM

## 2023-07-16 DIAGNOSIS — Z95828 Presence of other vascular implants and grafts: Secondary | ICD-10-CM

## 2023-07-16 DIAGNOSIS — R519 Headache, unspecified: Secondary | ICD-10-CM | POA: Diagnosis not present

## 2023-07-16 DIAGNOSIS — C50412 Malignant neoplasm of upper-outer quadrant of left female breast: Secondary | ICD-10-CM

## 2023-07-16 DIAGNOSIS — Z1731 Human epidermal growth factor receptor 2 positive status: Secondary | ICD-10-CM | POA: Diagnosis not present

## 2023-07-16 DIAGNOSIS — Z8379 Family history of other diseases of the digestive system: Secondary | ICD-10-CM | POA: Diagnosis not present

## 2023-07-16 DIAGNOSIS — I10 Essential (primary) hypertension: Secondary | ICD-10-CM | POA: Diagnosis not present

## 2023-07-16 DIAGNOSIS — N6320 Unspecified lump in the left breast, unspecified quadrant: Secondary | ICD-10-CM | POA: Diagnosis not present

## 2023-07-16 DIAGNOSIS — Z79899 Other long term (current) drug therapy: Secondary | ICD-10-CM | POA: Diagnosis not present

## 2023-07-16 DIAGNOSIS — Z803 Family history of malignant neoplasm of breast: Secondary | ICD-10-CM | POA: Diagnosis not present

## 2023-07-16 DIAGNOSIS — Z1721 Progesterone receptor positive status: Secondary | ICD-10-CM | POA: Diagnosis not present

## 2023-07-16 DIAGNOSIS — Z79620 Long term (current) use of immunosuppressive biologic: Secondary | ICD-10-CM | POA: Diagnosis not present

## 2023-07-16 DIAGNOSIS — Z79811 Long term (current) use of aromatase inhibitors: Secondary | ICD-10-CM | POA: Diagnosis not present

## 2023-07-16 DIAGNOSIS — Z823 Family history of stroke: Secondary | ICD-10-CM | POA: Diagnosis not present

## 2023-07-16 DIAGNOSIS — G629 Polyneuropathy, unspecified: Secondary | ICD-10-CM | POA: Diagnosis not present

## 2023-07-16 DIAGNOSIS — Z800 Family history of malignant neoplasm of digestive organs: Secondary | ICD-10-CM | POA: Diagnosis not present

## 2023-07-16 DIAGNOSIS — Z881 Allergy status to other antibiotic agents status: Secondary | ICD-10-CM | POA: Diagnosis not present

## 2023-07-16 LAB — CMP (CANCER CENTER ONLY)
ALT: 11 U/L (ref 0–44)
AST: 10 U/L — ABNORMAL LOW (ref 15–41)
Albumin: 3.8 g/dL (ref 3.5–5.0)
Alkaline Phosphatase: 101 U/L (ref 38–126)
Anion gap: 8 (ref 5–15)
BUN: 20 mg/dL (ref 8–23)
CO2: 25 mmol/L (ref 22–32)
Calcium: 9.3 mg/dL (ref 8.9–10.3)
Chloride: 103 mmol/L (ref 98–111)
Creatinine: 1.24 mg/dL — ABNORMAL HIGH (ref 0.44–1.00)
GFR, Estimated: 47 mL/min — ABNORMAL LOW (ref >60)
Glucose, Bld: 123 mg/dL — ABNORMAL HIGH (ref 70–99)
Potassium: 3.3 mmol/L — ABNORMAL LOW (ref 3.5–5.1)
Sodium: 136 mmol/L (ref 135–145)
Total Bilirubin: 0.7 mg/dL (ref 0.0–1.2)
Total Protein: 7.1 g/dL (ref 6.5–8.1)

## 2023-07-16 LAB — CBC WITH DIFFERENTIAL (CANCER CENTER ONLY)
Abs Immature Granulocytes: 0.09 10*3/uL — ABNORMAL HIGH (ref 0.00–0.07)
Basophils Absolute: 0 10*3/uL (ref 0.0–0.1)
Basophils Relative: 1 %
Eosinophils Absolute: 0.1 10*3/uL (ref 0.0–0.5)
Eosinophils Relative: 1 %
HCT: 33.9 % — ABNORMAL LOW (ref 36.0–46.0)
Hemoglobin: 10.8 g/dL — ABNORMAL LOW (ref 12.0–15.0)
Immature Granulocytes: 1 %
Lymphocytes Relative: 13 %
Lymphs Abs: 1.2 10*3/uL (ref 0.7–4.0)
MCH: 26.9 pg (ref 26.0–34.0)
MCHC: 31.9 g/dL (ref 30.0–36.0)
MCV: 84.3 fL (ref 80.0–100.0)
Monocytes Absolute: 0.7 10*3/uL (ref 0.1–1.0)
Monocytes Relative: 7 %
Neutro Abs: 6.8 10*3/uL (ref 1.7–7.7)
Neutrophils Relative %: 77 %
Platelet Count: 347 10*3/uL (ref 150–400)
RBC: 4.02 MIL/uL (ref 3.87–5.11)
RDW: 14.8 % (ref 11.5–15.5)
WBC Count: 8.8 10*3/uL (ref 4.0–10.5)
nRBC: 0 % (ref 0.0–0.2)

## 2023-07-16 MED ORDER — ACETAMINOPHEN 325 MG PO TABS
650.0000 mg | ORAL_TABLET | Freq: Once | ORAL | Status: AC
  Administered 2023-07-16: 650 mg via ORAL
  Filled 2023-07-16: qty 2

## 2023-07-16 MED ORDER — DIPHENHYDRAMINE HCL 25 MG PO CAPS
50.0000 mg | ORAL_CAPSULE | Freq: Once | ORAL | Status: AC
  Administered 2023-07-16: 50 mg via ORAL
  Filled 2023-07-16: qty 2

## 2023-07-16 MED ORDER — SODIUM CHLORIDE 0.9% FLUSH
10.0000 mL | Freq: Once | INTRAVENOUS | Status: AC
  Administered 2023-07-16: 10 mL

## 2023-07-16 MED ORDER — SODIUM CHLORIDE 0.9% FLUSH
10.0000 mL | INTRAVENOUS | Status: DC | PRN
  Administered 2023-07-16: 10 mL

## 2023-07-16 MED ORDER — SODIUM CHLORIDE 0.9 % IV SOLN: Freq: Once | INTRAVENOUS | Status: AC

## 2023-07-16 MED ORDER — TRASTUZUMAB-ANNS CHEMO 150 MG IV SOLR
600.0000 mg | Freq: Once | INTRAVENOUS | Status: AC
  Administered 2023-07-16: 600 mg via INTRAVENOUS
  Filled 2023-07-16: qty 28.57

## 2023-07-16 MED ORDER — HEPARIN SOD (PORK) LOCK FLUSH 100 UNIT/ML IV SOLN
500.0000 [IU] | Freq: Once | INTRAVENOUS | Status: AC | PRN
  Administered 2023-07-16: 500 [IU]

## 2023-07-16 NOTE — Progress Notes (Signed)
 Patient Care Team: [name], MD as PCP - General (Internal Medicine) [name], MD as Consulting Physician (Hematology and Oncology) Almond Lint, MD as Consulting Physician (General Surgery) Dorothy Puffer, MD as Consulting Physician (Radiation Oncology)  DIAGNOSIS:  Encounter Diagnosis  Name Primary?   Malignant neoplasm of upper-outer quadrant of left breast in female, estrogen receptor positive (HCC) Yes    SUMMARY OF ONCOLOGIC HISTORY: Oncology History  Malignant neoplasm of upper-outer quadrant of left breast in female, estrogen receptor positive (HCC)  04/30/2022 Initial Diagnosis   Screening mammogram to treat left breast mass measuring 0.7 cm, adjacent mass 0.5 cm, biopsy of both revealed grade 3 IDC with DCIS ER 100% PR 100% Ki-67 60%, HER2 3+ positive   05/25/2022 Cancer Staging   Staging form: Breast, AJCC 8th Edition - Clinical: Stage IA (cT1b, cN0, cM0, G3, ER+, PR+, HER2+) - Signed by [name], MD on 05/25/2022 Stage prefix: Initial diagnosis Histologic grading system: 3 grade system   07/19/2022 Surgery   Right lumpectomy: Grade 2 IDC, 1 cm with intermediate grade DCIS, ER 95%, PR 90%,, Ki-67 5%, HER2 -, 0/2 lymph nodes negative, anterior margin positive Left lumpectomy: Grade 3 IDC 1.1 cm with DCIS, margins negative, 0/2 lymph nodes negative, ER 100%, PR 100%, HER2 positive 3+, Ki-67 60%   07/19/2022 Cancer Staging   Staging form: Breast, AJCC 8th Edition - Pathologic stage from 07/19/2022: Stage IA (pT1c, pN0, cM0, G3, ER+, PR+, HER2+) - Signed by [name], NP on 07/15/2023 Histologic grading system: 3 grade system   07/29/2022 Genetic Testing   Negative genetic testing on the CancerNext-Expanded+RNAinsight panel.  The report date is July 29, 2022.  The CancerNext-Expanded gene panel offered by  [facility] and includes sequencing and rearrangement analysis for the following 77 genes: AIP, ALK, APC*, ATM*, AXIN2, BAP1, BARD1, BMPR1A, BRCA1*,  BRCA2*, BRIP1*, CDC73, CDH1*, CDK4, CDKN1B, CDKN2A, CHEK2*, CTNNA1, DICER1, FH, FLCN, KIF1B, LZTR1, MAX, MEN1, MET, MLH1*, MSH2*, MSH3, MSH6*, MUTYH*, NF1*, NF2, NTHL1, PALB2*, PHOX2B, PMS2*, POT1, PRKAR1A, PTCH1, PTEN*, RAD51C*, RAD51D*, RB1, RET, SDHA, SDHAF2, SDHB, SDHC, SDHD, SMAD4, SMARCA4, SMARCB1, SMARCE1, STK11, SUFU, TMEM127, TP53*, TSC1, TSC2, and VHL (sequencing and deletion/duplication); EGFR, EGLN1, HOXB13, KIT, MITF, PDGFRA, POLD1, and POLE (sequencing only); EPCAM and GREM1 (deletion/duplication only). DNA and RNA analyses performed for * genes.    08/14/2022 -  Chemotherapy   Patient is on Treatment Plan : BREAST Paclitaxel + Trastuzumab q7d / Trastuzumab q21d     12/18/2022 - 01/16/2023 Radiation Therapy   First Treatment Date: 2022-12-18 - Last Treatment Date: 2023-01-16   Plan Name: Breast_L Site: Breast, Left Technique: 3D Mode: Photon Dose Per Fraction: 2.66 Gy Prescribed Dose (Delivered / Prescribed): 42.56 Gy / 42.56 Gy Prescribed Fxs (Delivered / Prescribed): 16 / 16   Plan Name: Breast_L_Bst Site: Breast, Left Technique: 3D Mode: Photon Dose Per Fraction: 2 Gy Prescribed Dose (Delivered / Prescribed): 8 Gy / 8 Gy Prescribed Fxs (Delivered / Prescribed): 4 / 4   Plan Name: Breast_R Site: Breast, Right Technique: 3D Mode: Photon Dose Per Fraction: 2.66 Gy Prescribed Dose (Delivered / Prescribed): 42.56 Gy / 42.56 Gy Prescribed Fxs (Delivered / Prescribed): 16 / 16   Plan Name: Breast_R_Bst Site: Breast, Right Technique: 3D Mode: Photon Dose Per Fraction: 2 Gy Prescribed Dose (Delivered / Prescribed): 8 Gy / 8 Gy Prescribed Fxs (Delivered / Prescribed): 4 / 4   02/2023 -  Anti-estrogen oral therapy   Anastrozole daily x 7 years   Malignant neoplasm of  upper-outer quadrant of right breast in female, estrogen receptor positive (HCC)  07/19/2022 Cancer Staging   Staging form: Breast, AJCC 8th Edition - Pathologic stage from 07/19/2022: Stage IA (pT1b, pN0, cM0,  G2, ER+, PR+, HER2-) - Signed by [name], NP on 07/15/2023 Stage prefix: Initial diagnosis Histologic grading system: 3 grade system   07/15/2023 Initial Diagnosis   Malignant neoplasm of upper-outer quadrant of right breast in female, estrogen receptor positive (HCC)     CHIEF COMPLIANT: Last Herceptin treatment.  HISTORY OF PRESENT ILLNESS:  History of Present Illness The patient, with a history of cancer, presents for a follow-up visit after completing her final treatment. She has been on tamoxifen and recently switched to letrozole, which she started yesterday. She reports experiencing neuropathy, specifically at the tips of her fingers and toes. She denies any issues with memory and notes that her energy levels are improving.  In addition to her cancer treatment, the patient has been seen by a neurologist for headaches and a brain aneurysm. The headaches have improved significantly with Topamax. The aneurysm is small, less than a centimeter, and the patient is scheduled to see a neurovascular specialist for a second opinion.  The patient also has a port, which is scheduled to be removed now that her infusion treatment is complete. She has been keeping up with her echocardiograms, the last of which was done in January and showed good results.     ALLERGIES:  is allergic to flagyl [metronidazole].  MEDICATIONS:  Current Outpatient Medications  Medication Sig Dispense Refill   aspirin EC 81 MG tablet Take 81 mg by mouth daily. Swallow whole.     atenolol (TENORMIN) 50 MG tablet Take 50 mg by mouth every evening.     DULoxetine (CYMBALTA) 60 MG capsule Take 60 mg by mouth every evening.     esomeprazole (NEXIUM) 40 MG capsule Take 40 mg by mouth daily at 12 noon.     letrozole (FEMARA) 2.5 MG tablet Take 1 tablet (2.5 mg total) by mouth daily. 90 tablet 3   lisinopril-hydrochlorothiazide (ZESTORETIC) 20-25 MG tablet Take 1 tablet by mouth daily.     Multiple Vitamin  (MULTIVITAMIN WITH MINERALS) TABS tablet Take 1 tablet by mouth every evening.     predniSONE (DELTASONE) 50 MG tablet Take 1 tablet (50 mg total) by mouth daily with breakfast. 7 tablet 0   No current facility-administered medications for this visit.   Facility-Administered Medications Ordered in Other Visits  Medication Dose Route Frequency Provider Last Rate Last Admin   sodium chloride flush (NS) 0.9 % injection 10 mL  10 mL Intracatheter PRN [name], MD   10 mL at 07/16/23 1139    PHYSICAL EXAMINATION: ECOG PERFORMANCE STATUS: 1 - Symptomatic but completely ambulatory  Vitals:   07/16/23 0941  BP: (!) 105/50  Pulse: 71  Resp: 18  Temp: 97.9 F (36.6 C)  SpO2: 98%   Filed Weights   07/16/23 0941  Weight: 226 lb 9.6 oz (102.8 kg)     LABORATORY DATA:  I have reviewed the data as listed    Latest Ref Rng & Units 07/16/2023    9:10 AM 06/04/2023    8:50 AM 05/14/2023    9:22 AM  CMP  Glucose 70 - 99 mg/dL 161  90  096   BUN 8 - 23 mg/dL 20  14  20    Creatinine 0.44 - 1.00 mg/dL 0.45  4.09  8.11   Sodium 135 - 145 mmol/L  136  137  136   Potassium 3.5 - 5.1 mmol/L 3.3  3.7  3.6   Chloride 98 - 111 mmol/L 103  101  101   CO2 22 - 32 mmol/L 25  29  27    Calcium 8.9 - 10.3 mg/dL 9.3  9.9  9.6   Total Protein 6.5 - 8.1 g/dL 7.1  7.4  7.3   Total Bilirubin 0.0 - 1.2 mg/dL 0.7  0.4  0.5   Alkaline Phos 38 - 126 U/L 101  116  101   AST 15 - 41 U/L 10  12  13    ALT 0 - 44 U/L 11  9  9      Lab Results  Component Value Date   WBC 8.8 07/16/2023   HGB 10.8 (L) 07/16/2023   HCT 33.9 (L) 07/16/2023   MCV 84.3 07/16/2023   PLT 347 07/16/2023   NEUTROABS 6.8 07/16/2023    ASSESSMENT & PLAN:  Malignant neoplasm of upper-outer quadrant of left breast in female, 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   ------------------------------------------------------------------------------------------------------------------------------------------------ Treatment plan: 07/19/2022:Right lumpectomy: Grade 2 IDC, 1 cm with intermediate grade DCIS, ER 95%, PR 90%,, Ki-67 5%, HER2 -, 0/2 lymph nodes negative, anterior margin positive Left lumpectomy: Grade 3 IDC 1.1 cm with DCIS, margins negative, 0/2 lymph nodes negative, ER 100%, PR 100%, HER2 positive 3+, Ki-67 60% Adjuvant chemotherapy with Taxol Herceptin completed 10/29/2022 followed by Herceptin maintenance Adjuvant radiation 12/19/2022-01/16/2023 Adjuvant antiestrogen therapy started 07/15/2023 ------------------------------------------------------------------------  Current treatment: Continue with Herceptin every 3 weeks maintenance therapy until 07/16/2023   Anastrozole toxicities: Tolerating extremely well without any side effects.  Denies any hot flashes or arthralgias or myalgias. 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  Was seen by Dr. Barbaraann Cao and referred for vascular for intervention.  Mild peripheral neuropathy: Monitoring closely.  Mostly tips of the fingers and toes.  Today's last treatment of Herceptin. We will request a port to be removed. Follow-up in 3 months to assess tolerance to letrozole therapy.    No orders of the defined types were placed in this encounter.  The patient has a good understanding of the overall plan. she agrees with it. she will call with any problems that may develop before the next visit here. Total time spent: 30 mins including face to face time and time spent for planning, charting and co-ordination of care   Tamsen Meek, MD 07/16/23

## 2023-07-16 NOTE — Research (Incomplete)
 O9629, ICE COMPRESS: RANDOMIZED TRIAL OF LIMB CRYOCOMPRESSION VERSUS CONTINUOUS COMPRESSION VERSUS LOW CYCLIC COMPRESSION FOR THE PREVENTION  OF TAXANE-INDUCED PERIPHERAL NEUROPATHY   Christy arrives today Accompanied by her brother  for the 52 week visit. Confirmed Christy does not have wounds, sores, or lesions to extremities. Christy  has not had any vaccinations since last visit.   PROs:  Per study protocol, all PROs required for this visit were completed prior to other study activities and completeness has been verified.     LABS: No research labs due this visit.   MD/PROVIDER VISIT: Christy sees Dr Pamelia Hoit for today's visit.   ADVERSE EVENTS: Christy reports AE as below. Attribution per Dr Pamelia Hoit.   Adverse Event CTCAE Grade Onset date Resolved date Relationship to Study Intervention Action Taken Comments  Skin atrophy (solicited)        Skin hyperpigmentation (solicited)        Skin hypopigmentation (solicited)        Skin induration (solicited)        Skin ulceration (solicited)        Rash maculopapular (solicited)        Nail changes (solicited) Grade 1 05/29/2023  Unrelated    Cold intolerance (solicited) (general disorders and administration site conditions- other)        Frostbite (solicited) (skin and subcutaneous tissue disorders- other)        Peripheral sensory neuropathy Grade 1 10/11/22  Unrelated to [place]; related to paclitaxel None today; Taxol dose had been decreased during treatment     NEURO ASSESSMENT: All neuro assessments (Neuropen, [place] ,and Timed Get Up and Go) were completed by this nurse. Christy Lynch tolerated all testing without complaint.    DISPOSITION: Upon completion off all study requirements, Christy was escorted to the [facility] Room to wait for her infusion.   [name] was thanked for their time and continued voluntary participation in this study. Christy Christy Lynch has been provided direct contact  information and is encouraged to contact this Nurse for any needs or questions. All study activities have been completed for S2205.   [SIGNATURE]

## 2023-07-16 NOTE — Patient Instructions (Signed)
 [facility] - A DEPT OF [facility]  Discharge Instructions: Thank you for choosing [facility] to provide your oncology and hematology care.   If you have a lab appointment with the Cancer Center, please go directly to the Cancer Center and check in at the registration area.   Wear comfortable clothing and clothing appropriate for easy access to any Portacath or PICC line.   We strive to give you quality time with your provider. You may need to reschedule your appointment if you arrive late (15 or more minutes).  Arriving late affects you and other patients whose appointments are after yours.  Also, if you miss three or more appointments without notifying the office, you may be dismissed from the clinic at the provider's discretion.      For prescription refill requests, have your pharmacy contact our office and allow 72 hours for refills to be completed.    Today you received the following chemotherapy and/or immunotherapy agents kanjinti      To help prevent nausea and vomiting after your treatment, we encourage you to take your nausea medication as directed.  BELOW ARE SYMPTOMS THAT SHOULD BE REPORTED IMMEDIATELY: *FEVER GREATER THAN 100.4 F (38 C) OR HIGHER *CHILLS OR SWEATING *NAUSEA AND VOMITING THAT IS NOT CONTROLLED WITH YOUR NAUSEA MEDICATION *UNUSUAL SHORTNESS OF BREATH *UNUSUAL BRUISING OR BLEEDING *URINARY PROBLEMS (pain or burning when urinating, or frequent urination) *BOWEL PROBLEMS (unusual diarrhea, constipation, pain near the anus) TENDERNESS IN MOUTH AND THROAT WITH OR WITHOUT PRESENCE OF ULCERS (sore throat, sores in mouth, or a toothache) UNUSUAL RASH, SWELLING OR PAIN  UNUSUAL VAGINAL DISCHARGE OR ITCHING   Items with * indicate a potential emergency and should be followed up as soon as possible or go to the Emergency Department if any problems should occur.  Please show the CHEMOTHERAPY ALERT CARD or IMMUNOTHERAPY  ALERT CARD at check-in to the Emergency Department and triage nurse.  Should you have questions after your visit or need to cancel or reschedule your appointment, please contact [facility] - A DEPT OF [facility]  Dept: [phone]  and follow the prompts.  Office hours are 8:00 a.m. to 4:30 p.m. Monday - Friday. Please note that voicemails left after 4:00 p.m. may not be returned until the following business day.  We are closed weekends and major holidays. You have access to a nurse at all times for urgent questions. Please call the main number to the clinic Dept: [phone] and follow the prompts.   For any non-urgent questions, you may also contact your provider using MyChart. We now offer e-Visits for anyone 81 and older to request care online for non-urgent symptoms. For details visit [email or web address].   Also download the MyChart app! Go to the app store, search "MyChart", open the app, select [facility], and log in with your MyChart username and password.

## 2023-07-16 NOTE — Assessment & Plan Note (Signed)
 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  ------------------------------------------------------------------------------------------------------------------------------------------------ Treatment plan: 07/19/2022:Right lumpectomy: Grade 2 IDC, 1 cm with intermediate grade DCIS, ER 95%, PR 90%,, Ki-67 5%, HER2 -, 0/2 lymph nodes negative, anterior margin positive Left lumpectomy: Grade 3 IDC 1.1 cm with DCIS, margins negative, 0/2 lymph nodes negative, ER 100%, PR 100%, HER2 positive 3+, Ki-67 60% Adjuvant chemotherapy with Taxol Herceptin completed 10/29/2022 followed by Herceptin maintenance Adjuvant radiation 12/19/2022-01/16/2023 Adjuvant antiestrogen therapy started 02/19/2023 ------------------------------------------------------------------------  Current treatment: Continue with Herceptin every 3 weeks maintenance therapy until 07/16/2023   Anastrozole toxicities: Tolerating extremely well without any side effects.  Denies any hot flashes or arthralgias or myalgias. 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  Was seen by Dr. Barbaraann Cao and referred for vascular for intervention.  Today's last treatment of Herceptin. We will request a port to be removed. Follow-up in 6 months.

## 2023-07-17 ENCOUNTER — Encounter: Payer: Self-pay | Admitting: Hematology and Oncology

## 2023-07-30 ENCOUNTER — Encounter (HOSPITAL_BASED_OUTPATIENT_CLINIC_OR_DEPARTMENT_OTHER): Payer: Self-pay | Admitting: General Surgery

## 2023-07-30 ENCOUNTER — Other Ambulatory Visit: Payer: Self-pay

## 2023-07-30 NOTE — Progress Notes (Signed)
   07/30/23 1023  PAT Phone Screen  Is the patient taking a GLP-1 receptor agonist? No  Do You Have Diabetes? No  Do You Have Hypertension? Yes  Have You Ever Been to the ER for Asthma? No  Have You Taken Oral Steroids in the Past 3 Months? (S)  Yes (for migraine HA)  Do you Take Phenteramine or any Other Diet Drugs? No  Recent  Lab Work, EKG, CXR? Yes  Where was this test performed? 07-16-23 CBC/diff, CMP at [facility]  Do you have a history of heart problems? (S)  No (05-20-23 ECHO EF 65-70%)  Any Recent Hospitalizations? No  Height 5\' 7"  (1.702 m)  Weight 102.5 kg  Pat Appointment Scheduled (S)  Yes (EKG)

## 2023-07-31 DIAGNOSIS — I671 Cerebral aneurysm, nonruptured: Secondary | ICD-10-CM | POA: Diagnosis not present

## 2023-08-02 ENCOUNTER — Encounter (HOSPITAL_BASED_OUTPATIENT_CLINIC_OR_DEPARTMENT_OTHER)
Admission: RE | Admit: 2023-08-02 | Discharge: 2023-08-02 | Disposition: A | Discharge status: Home / Self Care | Source: Ambulatory Visit | Attending: General Surgery | Admitting: General Surgery

## 2023-08-02 DIAGNOSIS — Z01812 Encounter for preprocedural laboratory examination: Secondary | ICD-10-CM | POA: Insufficient documentation

## 2023-08-02 MED ORDER — CHLORHEXIDINE GLUCONATE CLOTH 2 % EX PADS: 6.0000 | MEDICATED_PAD | Freq: Once | CUTANEOUS | Status: DC

## 2023-08-02 NOTE — Progress Notes (Signed)

## 2023-08-06 ENCOUNTER — Other Ambulatory Visit: Payer: Self-pay

## 2023-08-06 ENCOUNTER — Encounter (HOSPITAL_BASED_OUTPATIENT_CLINIC_OR_DEPARTMENT_OTHER)
Admission: RE | Disposition: A | Discharge status: Home / Self Care | Payer: Self-pay | Source: Home / Self Care | Attending: General Surgery

## 2023-08-06 ENCOUNTER — Ambulatory Visit (HOSPITAL_BASED_OUTPATIENT_CLINIC_OR_DEPARTMENT_OTHER): Admitting: Certified Registered"

## 2023-08-06 ENCOUNTER — Ambulatory Visit (HOSPITAL_BASED_OUTPATIENT_CLINIC_OR_DEPARTMENT_OTHER)
Admission: RE | Admit: 2023-08-06 | Discharge: 2023-08-06 | Disposition: A | Discharge status: Home / Self Care | Attending: General Surgery | Admitting: General Surgery

## 2023-08-06 ENCOUNTER — Encounter (HOSPITAL_BASED_OUTPATIENT_CLINIC_OR_DEPARTMENT_OTHER): Payer: Self-pay | Admitting: General Surgery

## 2023-08-06 DIAGNOSIS — Z452 Encounter for adjustment and management of vascular access device: Secondary | ICD-10-CM | POA: Diagnosis not present

## 2023-08-06 DIAGNOSIS — Z9221 Personal history of antineoplastic chemotherapy: Secondary | ICD-10-CM | POA: Insufficient documentation

## 2023-08-06 DIAGNOSIS — I10 Essential (primary) hypertension: Secondary | ICD-10-CM

## 2023-08-06 DIAGNOSIS — Z87891 Personal history of nicotine dependence: Secondary | ICD-10-CM | POA: Insufficient documentation

## 2023-08-06 DIAGNOSIS — M797 Fibromyalgia: Secondary | ICD-10-CM | POA: Insufficient documentation

## 2023-08-06 DIAGNOSIS — E785 Hyperlipidemia, unspecified: Secondary | ICD-10-CM | POA: Diagnosis not present

## 2023-08-06 DIAGNOSIS — C50412 Malignant neoplasm of upper-outer quadrant of left female breast: Secondary | ICD-10-CM | POA: Insufficient documentation

## 2023-08-06 DIAGNOSIS — Z6835 Body mass index (BMI) 35.0-35.9, adult: Secondary | ICD-10-CM | POA: Diagnosis not present

## 2023-08-06 DIAGNOSIS — Z803 Family history of malignant neoplasm of breast: Secondary | ICD-10-CM | POA: Diagnosis not present

## 2023-08-06 DIAGNOSIS — E66813 Obesity, class 3: Secondary | ICD-10-CM | POA: Diagnosis not present

## 2023-08-06 HISTORY — DX: Headache, unspecified: R51.9

## 2023-08-06 HISTORY — PX: PORT-A-CATH REMOVAL: SHX5289

## 2023-08-06 SURGERY — REMOVAL PORT-A-CATH
Anesthesia: Monitor Anesthesia Care | Site: Chest

## 2023-08-06 MED ORDER — CEFAZOLIN SODIUM-DEXTROSE 2-4 GM/100ML-% IV SOLN
2.0000 g | INTRAVENOUS | Status: AC
  Administered 2023-08-06: 2 g via INTRAVENOUS

## 2023-08-06 MED ORDER — CEFAZOLIN SODIUM-DEXTROSE 2-4 GM/100ML-% IV SOLN
INTRAVENOUS | Status: AC
  Filled 2023-08-06: qty 100

## 2023-08-06 MED ORDER — ACETAMINOPHEN 10 MG/ML IV SOLN: 1000.0000 mg | Freq: Once | INTRAVENOUS | Status: DC | PRN

## 2023-08-06 MED ORDER — PROPOFOL 500 MG/50ML IV EMUL
INTRAVENOUS | Status: DC | PRN
  Administered 2023-08-06: 75 ug/kg/min via INTRAVENOUS

## 2023-08-06 MED ORDER — ACETAMINOPHEN 160 MG/5ML PO SOLN: 1000.0000 mg | Freq: Once | ORAL | Status: DC | PRN

## 2023-08-06 MED ORDER — FENTANYL CITRATE (PF) 100 MCG/2ML IJ SOLN
INTRAMUSCULAR | Status: AC
  Filled 2023-08-06: qty 2

## 2023-08-06 MED ORDER — PROPOFOL 10 MG/ML IV BOLUS
INTRAVENOUS | Status: AC
  Filled 2023-08-06: qty 20

## 2023-08-06 MED ORDER — OXYCODONE HCL 5 MG PO TABS: 5.0000 mg | ORAL_TABLET | Freq: Four times a day (QID) | ORAL | 0 refills | Status: DC | PRN

## 2023-08-06 MED ORDER — ACETAMINOPHEN 500 MG PO TABS
ORAL_TABLET | ORAL | Status: AC
  Filled 2023-08-06: qty 2

## 2023-08-06 MED ORDER — ACETAMINOPHEN 500 MG PO TABS: 1000.0000 mg | ORAL_TABLET | Freq: Once | ORAL | Status: DC | PRN

## 2023-08-06 MED ORDER — BUPIVACAINE HCL (PF) 0.25 % IJ SOLN
INTRAMUSCULAR | Status: AC
  Filled 2023-08-06: qty 30

## 2023-08-06 MED ORDER — MIDAZOLAM HCL 2 MG/2ML IJ SOLN
INTRAMUSCULAR | Status: AC
  Filled 2023-08-06: qty 2

## 2023-08-06 MED ORDER — LIDOCAINE-EPINEPHRINE (PF) 1 %-1:200000 IJ SOLN
INTRAMUSCULAR | Status: AC
  Filled 2023-08-06: qty 30

## 2023-08-06 MED ORDER — OXYCODONE HCL 5 MG/5ML PO SOLN: 5.0000 mg | Freq: Once | ORAL | Status: DC | PRN

## 2023-08-06 MED ORDER — OXYCODONE HCL 5 MG PO TABS: 5.0000 mg | ORAL_TABLET | Freq: Once | ORAL | Status: DC | PRN

## 2023-08-06 MED ORDER — FENTANYL CITRATE (PF) 100 MCG/2ML IJ SOLN
INTRAMUSCULAR | Status: DC | PRN
  Administered 2023-08-06: 50 ug via INTRAVENOUS

## 2023-08-06 MED ORDER — FENTANYL CITRATE (PF) 100 MCG/2ML IJ SOLN
25.0000 ug | INTRAMUSCULAR | Status: DC | PRN
Start: 2023-08-06 — End: 2023-08-06

## 2023-08-06 MED ORDER — LACTATED RINGERS IV SOLN: INTRAVENOUS | Status: DC

## 2023-08-06 MED ORDER — ONDANSETRON HCL 4 MG/2ML IJ SOLN
INTRAMUSCULAR | Status: AC
  Filled 2023-08-06: qty 2

## 2023-08-06 MED ORDER — LIDOCAINE-EPINEPHRINE (PF) 1 %-1:200000 IJ SOLN
INTRAMUSCULAR | Status: DC | PRN
  Administered 2023-08-06: 10 mL

## 2023-08-06 MED ORDER — ACETAMINOPHEN 500 MG PO TABS
1000.0000 mg | ORAL_TABLET | ORAL | Status: AC
  Administered 2023-08-06: 1000 mg via ORAL

## 2023-08-06 MED ORDER — PROPOFOL 10 MG/ML IV BOLUS
INTRAVENOUS | Status: DC | PRN
  Administered 2023-08-06: 40 mg via INTRAVENOUS

## 2023-08-06 MED ORDER — MIDAZOLAM HCL 2 MG/2ML IJ SOLN
INTRAMUSCULAR | Status: DC | PRN
  Administered 2023-08-06: 2 mg via INTRAVENOUS

## 2023-08-06 SURGICAL SUPPLY — 27 items
BLADE HEX COATED 2.75 (ELECTRODE) ×1 IMPLANT
BLADE SURG 15 STRL LF DISP TIS (BLADE) ×1 IMPLANT
CANISTER SUCT 1200ML W/VALVE (MISCELLANEOUS) IMPLANT
CHLORAPREP W/TINT 26 (MISCELLANEOUS) ×1 IMPLANT
COVER BACK TABLE 60X90IN (DRAPES) ×1 IMPLANT
COVER MAYO STAND STRL (DRAPES) ×1 IMPLANT
DERMABOND ADVANCED .7 DNX12 (GAUZE/BANDAGES/DRESSINGS) ×1 IMPLANT
DRAPE LAPAROTOMY 100X72 PEDS (DRAPES) ×1 IMPLANT
DRAPE UTILITY XL STRL (DRAPES) ×1 IMPLANT
ELECT REM PT RETURN 9FT ADLT (ELECTROSURGICAL) ×1 IMPLANT
ELECTRODE REM PT RTRN 9FT ADLT (ELECTROSURGICAL) ×1 IMPLANT
GLOVE BIO SURGEON STRL SZ 6 (GLOVE) ×1 IMPLANT
GLOVE BIOGEL PI IND STRL 6.5 (GLOVE) ×1 IMPLANT
GOWN STRL REUS W/ TWL LRG LVL3 (GOWN DISPOSABLE) ×1 IMPLANT
GOWN STRL REUS W/ TWL XL LVL3 (GOWN DISPOSABLE) ×1 IMPLANT
NDL HYPO 25X1 1.5 SAFETY (NEEDLE) ×1 IMPLANT
NEEDLE HYPO 25X1 1.5 SAFETY (NEEDLE) ×1 IMPLANT
NS IRRIG 1000ML POUR BTL (IV SOLUTION) IMPLANT
PACK BASIN DAY SURGERY FS (CUSTOM PROCEDURE TRAY) ×1 IMPLANT
PENCIL SMOKE EVACUATOR (MISCELLANEOUS) ×1 IMPLANT
SPIKE FLUID TRANSFER (MISCELLANEOUS) IMPLANT
SUT MNCRL AB 4-0 PS2 18 (SUTURE) ×1 IMPLANT
SUT VIC AB 3-0 SH 27X BRD (SUTURE) ×1 IMPLANT
SYR CONTROL 10ML LL (SYRINGE) ×1 IMPLANT
TOWEL GREEN STERILE FF (TOWEL DISPOSABLE) ×1 IMPLANT
TUBE CONNECTING 20X1/4 (TUBING) IMPLANT
YANKAUER SUCT BULB TIP NO VENT (SUCTIONS) IMPLANT

## 2023-08-06 NOTE — Discharge Instructions (Addendum)
 [facility] Office Phone Number [phone]   POST OP INSTRUCTIONS  Always review your discharge instruction sheet given to you by the facility where your surgery was performed.  IF YOU HAVE DISABILITY OR FAMILY LEAVE FORMS, YOU MUST BRING THEM TO THE OFFICE FOR PROCESSING.  DO NOT GIVE THEM TO YOUR DOCTOR.  Take 2 tylenol (acetominophen) three times a day for 3 days.  Please take next dose at 8:45pm this evening. If you still have pain, add ibuprofen with food in between if able to take this (if you have kidney issues or stomach issues, do not take ibuprofen).  If both of those are not enough, add the narcotic pain pill.  If you find you are needing a lot of this overnight after surgery, call the next morning for a refill.   Take your usually prescribed medications unless otherwise directed If you need a refill on your pain medication, please contact your pharmacy.  They will contact our office to request authorization.  Prescriptions will not be filled after 5pm or on week-ends. You should eat very light the first 24 hours after surgery, such as soup, crackers, pudding, etc.  Resume your normal diet the day after surgery It is common to experience some constipation if taking pain medication after surgery.  Increasing fluid intake and taking a stool softener will usually help or prevent this problem from occurring.  A mild laxative (Milk of Magnesia or Miralax) should be taken according to package directions if there are no bowel movements after 48 hours. You may shower in 48 hours.  The surgical glue will flake off in 2-3 weeks.   ACTIVITIES:  No strenuous activity or heavy lifting for 1 week.   You may drive when you no longer are taking prescription pain medication, you can comfortably wear a seatbelt, and you can safely maneuver your car and apply brakes. RETURN TO WORK:  __________2-7 days if applicable_______________ You should see your doctor in the office for a follow-up  appointment approximately three-four weeks after your surgery.    WHEN TO CALL YOUR DOCTOR: Fever over 101.0 Nausea and/or vomiting. Extreme swelling or bruising. Continued bleeding from incision. Increased pain, redness, or drainage from the incision.  The clinic staff is available to answer your questions during regular business hours.  Please don't hesitate to call and ask to speak to one of the nurses for clinical concerns.  If you have a medical emergency, go to the nearest emergency room or call 911.  A surgeon from [facility] is always on call at the hospital.  For further questions, please visit [email or web address]    Post Anesthesia Home Care Instructions  Activity: Get plenty of rest for the remainder of the day. A responsible individual must stay with you for 24 hours following the procedure.  For the next 24 hours, DO NOT: -Drive a car -Advertising copywriter -Drink alcoholic beverages -Take any medication unless instructed by your physician -Make any legal decisions or sign important papers.  Meals: Start with liquid foods such as gelatin or soup. Progress to regular foods as tolerated. Avoid greasy, spicy, heavy foods. If nausea and/or vomiting occur, drink only clear liquids until the nausea and/or vomiting subsides. Call your physician if vomiting continues.  Special Instructions/Symptoms: Your throat may feel dry or sore from the anesthesia or the breathing tube placed in your throat during surgery. If this causes discomfort, gargle with warm salt water. The discomfort should disappear within 24 hours.  If you had  a scopolamine patch placed behind your ear for the management of post- operative nausea and/or vomiting:  1. The medication in the patch is effective for 72 hours, after which it should be removed.  Wrap patch in a tissue and discard in the trash. Wash hands thoroughly with soap and water. 2. You may remove the patch earlier than 72 hours  if you experience unpleasant side effects which may include dry mouth, dizziness or visual disturbances. 3. Avoid touching the patch. Wash your hands with soap and water after contact with the patch.

## 2023-08-06 NOTE — Transfer of Care (Signed)
 Immediate Anesthesia Transfer of Care Note  Patient: Christy Lynch  Procedure(s) Performed: Procedure(s) (LRB): REMOVAL PORT-A-CATH (N/A)  Patient Location: PACU  Anesthesia Type: MAC  Level of Consciousness: awake, alert , oriented and patient cooperative  Airway & Oxygen Therapy: Patient Spontanous Breathing Room Air  Post-op Assessment: Report given to PACU RN and Post -op Vital signs reviewed and stable  Post vital signs: Reviewed and stable  Complications: No apparent anesthesia complications  Last Vitals:  Vitals Value Taken Time  BP 104/46 08/06/23 1439  Temp    Pulse 62 08/06/23 1441  Resp 19 08/06/23 1441  SpO2 98 % 08/06/23 1441  Vitals shown include unfiled device data.  Last Pain:  Vitals:   08/06/23 1244  TempSrc: Temporal  PainSc: 0-No pain      Patients Stated Pain Goal: 4 (08/06/23 1244)  Complications: No notable events documented.

## 2023-08-06 NOTE — Op Note (Signed)
  PRE-OPERATIVE DIAGNOSIS:  un-needed Port-A-Cath for left breast cancer  POST-OPERATIVE DIAGNOSIS:  Same   PROCEDURE:  Procedure(s):  REMOVAL PORT-A-CATH  SURGEON:  Surgeon(s):  Almond Lint, MD  ANESTHESIA:   MAC + local  EBL:   Minimal  SPECIMEN:  None  Complications : none known  Procedure:   Pt was  identified in the holding area and taken to the operating room where she was placed supine on the operating room table.  MAC anesthesia was induced.  The left upper chest was prepped and draped.  The prior incision was anesthetized with local anesthetic.  The incision was opened with a #15 blade.  The subcutaneous tissue was divided with the cautery.  The port was identified and the capsule opened.  The four 2-0 prolene sutures were removed.  The port was then removed and pressure held on the tract.  The catheter appeared intact without evidence of breakage.  The wound was inspected for hemostasis, which was achieved with cautery.  The wound was closed with 3-0 vicryl deep dermal interrupted sutures and 4-0 Monocryl running subcuticular suture.  The wound was cleaned, dried, and dressed with dermabond.  The patient was awakened from anesthesia and taken to the PACU in stable condition.  Needle, sponge, and instrument counts are correct.

## 2023-08-06 NOTE — Anesthesia Preprocedure Evaluation (Addendum)
 Anesthesia Evaluation  Patient identified by MRN, date of birth, ID band Patient awake    Reviewed: Allergy & Precautions, NPO status , Patient's Chart, lab work & pertinent test results  History of Anesthesia Complications Negative for: history of anesthetic complications  Airway Mallampati: III  TM Distance: >3 FB Neck ROM: Full    Dental  (+) Teeth Intact, Dental Advisory Given   Pulmonary neg shortness of breath, neg COPD, neg recent URI, former smoker   breath sounds clear to auscultation       Cardiovascular hypertension, Pt. on home beta blockers and Pt. on medications (-) angina (-) Past MI and (-) CHF  Rhythm:Regular     Neuro/Psych negative neurological ROS     GI/Hepatic Neg liver ROS,GERD  ,,  Endo/Other    Class 3 obesity  Renal/GU negative Renal ROS     Musculoskeletal  (+) Arthritis ,  Fibromyalgia -  Abdominal  (+) + obese  Peds  Hematology negative hematology ROS (+)   Anesthesia Other Findings   Reproductive/Obstetrics                              Anesthesia Physical Anesthesia Plan  ASA: 3  Anesthesia Plan: MAC   Post-op Pain Management: Minimal or no pain anticipated   Induction: Intravenous  PONV Risk Score and Plan: 2 and Ondansetron and Propofol infusion  Airway Management Planned: Nasal Cannula, Natural Airway and Simple Face Mask  Additional Equipment: None  Intra-op Plan:   Post-operative Plan:   Informed Consent: I have reviewed the patients History and Physical, chart, labs and discussed the procedure including the risks, benefits and alternatives for the proposed anesthesia with the patient or authorized representative who has indicated his/her understanding and acceptance.     Dental advisory given  Plan Discussed with: CRNA  Anesthesia Plan Comments:          Anesthesia Quick Evaluation

## 2023-08-06 NOTE — Anesthesia Procedure Notes (Signed)
 Procedure Name: MAC Date/Time: 08/06/2023 2:07 PM  Performed by: Thornell Mule, CRNAPre-anesthesia Checklist: Patient identified, Emergency Drugs available, Suction available and Patient being monitored Patient Re-evaluated:Patient Re-evaluated prior to induction Oxygen Delivery Method: Simple face mask

## 2023-08-06 NOTE — H&P (Signed)
 Christy Lynch is an 68 y.o. female.   Chief Complaint: port in place HPI:  Patient presents with port in place after completion of chemotherapy for left breast cancer.  She desires removal.  She has no clinical evidence of disease.    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   Bursitis of hip    Esophageal reflux    Family history of breast cancer    Family history of colon cancer    Family history of kidney cancer    Fibromyalgia    Headache    Hypertension    Other and unspecified hyperlipidemia     Past Surgical History:  Procedure Laterality Date   ABDOMINAL HYSTERECTOMY     BREAST BIOPSY Left    neg bx early 2000's   BREAST BIOPSY Left 04/30/2022   [place] LT BREAST BX W LOC DEV 1ST LESION IMG BX SPEC US GUIDE 04/30/2022 [facility]   BREAST BIOPSY Left 04/30/2022   [place] LT BREAST BX W LOC DEV EA ADD LESION IMG BX SPEC US GUIDE 04/30/2022 [facility]   BREAST BIOPSY  07/17/2022   MM LT RADIOACTIVE SEED LOC MAMMO GUIDE 07/17/2022 [facility]   BREAST BIOPSY  07/17/2022   MM LT RADIOACTIVE SEED EA ADD LESION LOC MAMMO GUIDE 07/17/2022 [facility]   BREAST BIOPSY  07/17/2022   MM RT RADIOACTIVE SEED LOC MAMMO GUIDE 07/17/2022 [facility]   BREAST LUMPECTOMY WITH RADIOACTIVE SEED AND SENTINEL LYMPH NODE BIOPSY Bilateral 07/19/2022   Procedure: BILATERAL BREAST LUMPECTOMY WITH RADIOACTIVE SEED AND BILATERAL SENTINEL LYMPH NODE BIOPSY;  Surgeon: Almond Lint, MD;  Location: [facility];  Service: General;  Laterality: Bilateral;   LEFT HEART CATH  12/12/2006   PORTACATH PLACEMENT Left 07/19/2022   Procedure: INSERTION PORT-A-CATH;  Surgeon: Almond Lint, MD;  Location: [facility];  Service: General;  Laterality: Left;    Family History  Problem Relation Age of Onset   Breast cancer Mother 79   Hypertension Mother    Stroke Mother    Kidney disease Father    Kidney cancer  Brother 24   Colon cancer Maternal Uncle 61   Breast cancer Cousin        maternal first cousin, dx 25s   Social History:  reports that she has quit smoking. She has never used smokeless tobacco. She reports that she does not drink alcohol and does not use drugs.  Allergies:  Allergies  Allergen Reactions   Flagyl [Metronidazole] Nausea And Vomiting and Other (See Comments)    headache    Medications Prior to Admission  Medication Sig Dispense Refill   aspirin EC 81 MG tablet Take 81 mg by mouth daily. Swallow whole.     atenolol (TENORMIN) 50 MG tablet Take 50 mg by mouth every evening.     DULoxetine (CYMBALTA) 60 MG capsule Take 60 mg by mouth every evening.     esomeprazole (NEXIUM) 40 MG capsule Take 40 mg by mouth daily at 12 noon.     letrozole (FEMARA) 2.5 MG tablet Take 1 tablet (2.5 mg total) by mouth daily. 90 tablet 3   lisinopril-hydrochlorothiazide (ZESTORETIC) 20-25 MG tablet Take 1 tablet by mouth daily.     Multiple Vitamin (MULTIVITAMIN WITH MINERALS) TABS tablet Take 1 tablet by mouth every evening.     topiramate (TOPAMAX) 50 MG tablet Take 50 mg by mouth daily.  No results found for this or any previous visit (from the past 48 hours). No results found.  Review of Systems  All other systems reviewed and are negative.   Blood pressure (!) 117/56, pulse 67, temperature 97.9 F (36.6 C), temperature source Temporal, resp. rate 16, height 5\' 7"  (1.702 m), weight 102.5 kg, SpO2 100%. Physical Exam Vitals reviewed.  Constitutional:      General: She is not in acute distress.    Appearance: Normal appearance. She is not ill-appearing.  HENT:     Head: Normocephalic and atraumatic.     Nose: Nose normal.  Eyes:     General: No scleral icterus.    Extraocular Movements: Extraocular movements intact.     Conjunctiva/sclera: Conjunctivae normal.     Pupils: Pupils are equal, round, and reactive to light.  Cardiovascular:     Rate and Rhythm: Normal rate.   Pulmonary:     Effort: Pulmonary effort is normal.  Abdominal:     General: Abdomen is flat.  Skin:    General: Skin is warm and dry.     Capillary Refill: Capillary refill takes 2 to 3 seconds.     Findings: No bruising or erythema.  Neurological:     General: No focal deficit present.     Mental Status: She is alert and oriented to person, place, and time.  Psychiatric:        Mood and Affect: Mood normal.        Behavior: Behavior normal.        Thought Content: Thought content normal.        Judgment: Judgment normal.      Assessment/Plan Left breast cancer Port in place Plan removal.  Procedure discussed with patient including risks and recovery Patient desires to proceed.   Almond Lint, MD 08/06/2023, 1:49 PM

## 2023-08-07 ENCOUNTER — Encounter (HOSPITAL_BASED_OUTPATIENT_CLINIC_OR_DEPARTMENT_OTHER): Payer: Self-pay | Admitting: General Surgery

## 2023-08-08 NOTE — Anesthesia Postprocedure Evaluation (Signed)
 Anesthesia Post Note  Patient: Christy Lynch  Procedure(s) Performed: REMOVAL PORT-A-CATH (Chest)     Patient location during evaluation: PACU Anesthesia Type: MAC Level of consciousness: awake and alert Pain management: pain level controlled Vital Signs Assessment: post-procedure vital signs reviewed and stable Respiratory status: spontaneous breathing, nonlabored ventilation and respiratory function stable Cardiovascular status: stable and blood pressure returned to baseline Postop Assessment: no apparent nausea or vomiting Anesthetic complications: no   No notable events documented.  Last Vitals:  Vitals:   08/06/23 1500 08/06/23 1515  BP: 116/73 (!) 111/59  Pulse: (!) 56 (!) 55  Resp: 16 16  Temp:  36.6 C  SpO2: 99% 96%    Last Pain:  Vitals:   08/07/23 1028  TempSrc:   PainSc: 0-No pain                 Yusuke Beza

## 2023-08-09 ENCOUNTER — Other Ambulatory Visit: Payer: Self-pay | Admitting: Neurosurgery

## 2023-08-09 DIAGNOSIS — Z7189 Other specified counseling: Secondary | ICD-10-CM | POA: Diagnosis not present

## 2023-08-09 DIAGNOSIS — I10 Essential (primary) hypertension: Secondary | ICD-10-CM | POA: Diagnosis not present

## 2023-08-09 DIAGNOSIS — I700 Atherosclerosis of aorta: Secondary | ICD-10-CM | POA: Diagnosis not present

## 2023-08-09 DIAGNOSIS — I671 Cerebral aneurysm, nonruptured: Secondary | ICD-10-CM

## 2023-08-09 DIAGNOSIS — Z713 Dietary counseling and surveillance: Secondary | ICD-10-CM | POA: Diagnosis not present

## 2023-08-09 DIAGNOSIS — Z0000 Encounter for general adult medical examination without abnormal findings: Secondary | ICD-10-CM | POA: Diagnosis not present

## 2023-08-09 DIAGNOSIS — Z299 Encounter for prophylactic measures, unspecified: Secondary | ICD-10-CM | POA: Diagnosis not present

## 2023-08-13 ENCOUNTER — Ambulatory Visit
Admission: RE | Admit: 2023-08-13 | Discharge: 2023-08-13 | Disposition: A | Discharge status: Home / Self Care | Source: Ambulatory Visit | Attending: Adult Health | Admitting: Adult Health

## 2023-08-13 DIAGNOSIS — Z853 Personal history of malignant neoplasm of breast: Secondary | ICD-10-CM | POA: Diagnosis not present

## 2023-08-13 DIAGNOSIS — Z170 Estrogen receptor positive status [ER+]: Secondary | ICD-10-CM

## 2023-08-13 DIAGNOSIS — Z08 Encounter for follow-up examination after completed treatment for malignant neoplasm: Secondary | ICD-10-CM | POA: Diagnosis not present

## 2023-08-20 ENCOUNTER — Other Ambulatory Visit: Payer: Self-pay | Admitting: Neurosurgery

## 2023-08-20 ENCOUNTER — Inpatient Hospital Stay: Payer: Medicare Other | Admitting: Internal Medicine

## 2023-08-20 ENCOUNTER — Other Ambulatory Visit: Payer: Self-pay

## 2023-08-20 ENCOUNTER — Ambulatory Visit (HOSPITAL_COMMUNITY)
Admission: RE | Admit: 2023-08-20 | Discharge: 2023-08-20 | Disposition: A | Discharge status: Home / Self Care | Source: Ambulatory Visit | Attending: Neurosurgery | Admitting: Neurosurgery

## 2023-08-20 VITALS — BP 103/60 | HR 66 | Temp 97.9°F | Resp 17 | Ht 67.0 in | Wt 227.0 lb

## 2023-08-20 DIAGNOSIS — I10 Essential (primary) hypertension: Secondary | ICD-10-CM | POA: Diagnosis not present

## 2023-08-20 DIAGNOSIS — Z7982 Long term (current) use of aspirin: Secondary | ICD-10-CM | POA: Diagnosis not present

## 2023-08-20 DIAGNOSIS — I671 Cerebral aneurysm, nonruptured: Secondary | ICD-10-CM | POA: Insufficient documentation

## 2023-08-20 DIAGNOSIS — Z853 Personal history of malignant neoplasm of breast: Secondary | ICD-10-CM | POA: Insufficient documentation

## 2023-08-20 DIAGNOSIS — Z79899 Other long term (current) drug therapy: Secondary | ICD-10-CM | POA: Diagnosis not present

## 2023-08-20 HISTORY — PX: IR ANGIO INTRA EXTRACRAN SEL COM CAROTID INNOMINATE UNI L MOD SED: IMG5358

## 2023-08-20 HISTORY — PX: IR ANGIO VERTEBRAL SEL VERTEBRAL UNI L MOD SED: IMG5367

## 2023-08-20 HISTORY — PX: IR ANGIO INTRA EXTRACRAN SEL INTERNAL CAROTID UNI R MOD SED: IMG5362

## 2023-08-20 LAB — PROTIME-INR
INR: 1 (ref 0.8–1.2)
Prothrombin Time: 13.6 s (ref 11.4–15.2)

## 2023-08-20 LAB — BASIC METABOLIC PANEL WITH GFR
Anion gap: 13 (ref 5–15)
BUN: 14 mg/dL (ref 8–23)
CO2: 24 mmol/L (ref 22–32)
Calcium: 9.9 mg/dL (ref 8.9–10.3)
Chloride: 100 mmol/L (ref 98–111)
Creatinine, Ser: 1.05 mg/dL — ABNORMAL HIGH (ref 0.44–1.00)
GFR, Estimated: 58 mL/min — ABNORMAL LOW (ref >60)
Glucose, Bld: 108 mg/dL — ABNORMAL HIGH (ref 70–99)
Potassium: 3.7 mmol/L (ref 3.5–5.1)
Sodium: 137 mmol/L (ref 135–145)

## 2023-08-20 LAB — CBC WITH DIFFERENTIAL/PLATELET
Abs Immature Granulocytes: 0.03 10*3/uL (ref 0.00–0.07)
Basophils Absolute: 0.1 10*3/uL (ref 0.0–0.1)
Basophils Relative: 1 %
Eosinophils Absolute: 0.1 10*3/uL (ref 0.0–0.5)
Eosinophils Relative: 1 %
HCT: 36 % (ref 36.0–46.0)
Hemoglobin: 11.4 g/dL — ABNORMAL LOW (ref 12.0–15.0)
Immature Granulocytes: 0 %
Lymphocytes Relative: 17 %
Lymphs Abs: 1.2 10*3/uL (ref 0.7–4.0)
MCH: 27 pg (ref 26.0–34.0)
MCHC: 31.7 g/dL (ref 30.0–36.0)
MCV: 85.1 fL (ref 80.0–100.0)
Monocytes Absolute: 0.5 10*3/uL (ref 0.1–1.0)
Monocytes Relative: 7 %
Neutro Abs: 5.1 10*3/uL (ref 1.7–7.7)
Neutrophils Relative %: 74 %
Platelets: 341 10*3/uL (ref 150–400)
RBC: 4.23 MIL/uL (ref 3.87–5.11)
RDW: 15.7 % — ABNORMAL HIGH (ref 11.5–15.5)
WBC: 7 10*3/uL (ref 4.0–10.5)
nRBC: 0 % (ref 0.0–0.2)

## 2023-08-20 LAB — APTT: aPTT: 31 s (ref 24–36)

## 2023-08-20 MED ORDER — CEFAZOLIN SODIUM-DEXTROSE 2-4 GM/100ML-% IV SOLN: 2.0000 g | INTRAVENOUS | Status: DC

## 2023-08-20 MED ORDER — CHLORHEXIDINE GLUCONATE CLOTH 2 % EX PADS: 6.0000 | MEDICATED_PAD | Freq: Once | CUTANEOUS | Status: DC

## 2023-08-20 MED ORDER — LIDOCAINE HCL 1 % IJ SOLN
10.0000 mL | Freq: Once | INTRAMUSCULAR | Status: AC
  Administered 2023-08-20: 10 mL via INTRADERMAL

## 2023-08-20 MED ORDER — LIDOCAINE HCL 1 % IJ SOLN
INTRAMUSCULAR | Status: AC
  Filled 2023-08-20: qty 20

## 2023-08-20 MED ORDER — HEPARIN SODIUM (PORCINE) 1000 UNIT/ML IJ SOLN
INTRAMUSCULAR | Status: AC | PRN
  Administered 2023-08-20: 2000 [IU] via INTRAVENOUS

## 2023-08-20 MED ORDER — IOHEXOL 300 MG/ML  SOLN
100.0000 mL | Freq: Once | INTRAMUSCULAR | Status: AC | PRN
  Administered 2023-08-20: 45 mL via INTRA_ARTERIAL

## 2023-08-20 MED ORDER — FENTANYL CITRATE (PF) 100 MCG/2ML IJ SOLN
INTRAMUSCULAR | Status: AC | PRN
  Administered 2023-08-20: 25 ug via INTRAVENOUS

## 2023-08-20 MED ORDER — HYDROCODONE-ACETAMINOPHEN 5-325 MG PO TABS
1.0000 | ORAL_TABLET | ORAL | Status: DC | PRN
  Administered 2023-08-20: 1 via ORAL
  Filled 2023-08-20: qty 1

## 2023-08-20 MED ORDER — HEPARIN SODIUM (PORCINE) 1000 UNIT/ML IJ SOLN
INTRAMUSCULAR | Status: AC
  Filled 2023-08-20: qty 10

## 2023-08-20 MED ORDER — MIDAZOLAM HCL 2 MG/2ML IJ SOLN
INTRAMUSCULAR | Status: AC | PRN
  Administered 2023-08-20: 1 mg via INTRAVENOUS

## 2023-08-20 MED ORDER — FENTANYL CITRATE (PF) 100 MCG/2ML IJ SOLN
INTRAMUSCULAR | Status: AC
  Filled 2023-08-20: qty 2

## 2023-08-20 MED ORDER — MIDAZOLAM HCL 2 MG/2ML IJ SOLN
INTRAMUSCULAR | Status: AC
  Filled 2023-08-20: qty 2

## 2023-08-20 NOTE — Sedation Documentation (Signed)
 Patient transported to recovery area via stretcher. Bedside report given to RN. Femoral site assessed - Level 0, no hematoma, dressing is clean, dry, and intact. Pulses also assessed bilaterally.

## 2023-08-20 NOTE — H&P (Signed)
 Chief Complaint   Aneurysm  History of Present Illness  Christy Lynch is a 68 year old woman I seen for initial consultation at the request of Dr. Barbaraann Cao. She is referred for a recently discovered incidental right carotid aneurysm. Briefly, the patient was diagnosed with breast cancer a little more than a year ago. She underwent surgery followed by radiation and chemotherapy. She is under the care of Dr. Pamelia Hoit. She began complaining of some mild chronic headaches a few months ago, and ultimately MRI of the brain was ordered, presumably to rule out metastatic disease. While there is no evidence of intracranial metastatic disease, there was suggestion of a right carotid aneurysm. She was seen by Dr. Barbaraann Cao where CT angiogram was ordered confirming the presence of a right carotid aneurysm. She was therefore referred for neurosurgical cerebrovascular evaluation.  Of note, the patient reports a history of medically controlled hypertension. No history of diabetes. She does have history of breast cancer as detailed above. No known lung, liver, kidney disease. She is currently on a baby aspirin. Patient is a nonsmoker. There is no family history of intracranial aneurysms or unexplained intracranial hemorrhage.   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   Bursitis of hip    Esophageal reflux    Family history of breast cancer    Family history of colon cancer    Family history of kidney cancer    Fibromyalgia    Headache    Hypertension    Other and unspecified hyperlipidemia     Past Surgical History   Past Surgical History:  Procedure Laterality Date   ABDOMINAL HYSTERECTOMY     BREAST BIOPSY Left    neg bx early 2000's   BREAST BIOPSY Left 04/30/2022   [place] LT BREAST BX W LOC DEV 1ST LESION IMG BX SPEC US GUIDE 04/30/2022 [facility]   BREAST BIOPSY Left 04/30/2022   [place] LT BREAST BX W LOC DEV EA ADD LESION  IMG BX SPEC US GUIDE 04/30/2022 [facility]   BREAST BIOPSY  07/17/2022   MM LT RADIOACTIVE SEED LOC MAMMO GUIDE 07/17/2022 [facility]   BREAST BIOPSY  07/17/2022   MM LT RADIOACTIVE SEED EA ADD LESION LOC MAMMO GUIDE 07/17/2022 [facility]   BREAST BIOPSY  07/17/2022   MM RT RADIOACTIVE SEED LOC MAMMO GUIDE 07/17/2022 [facility]   BREAST LUMPECTOMY WITH RADIOACTIVE SEED AND SENTINEL LYMPH NODE BIOPSY Bilateral 07/19/2022   Procedure: BILATERAL BREAST LUMPECTOMY WITH RADIOACTIVE SEED AND BILATERAL SENTINEL LYMPH NODE BIOPSY;  Surgeon: Almond Lint, MD;  Location: [facility];  Service: General;  Laterality: Bilateral;   LEFT HEART CATH  12/12/2006   PORT-A-CATH REMOVAL N/A 08/06/2023   Procedure: REMOVAL PORT-A-CATH;  Surgeon: Almond Lint, MD;  Location: [facility];  Service: General;  Laterality: N/A;   PORTACATH PLACEMENT Left 07/19/2022   Procedure: INSERTION PORT-A-CATH;  Surgeon: Almond Lint, MD;  Location: [facility];  Service: General;  Laterality: Left;    Social History   Social History   Tobacco Use   Smoking status: Former   Smokeless tobacco: Never  Vaping Use   Vaping status: Never Used  Substance Use Topics   Alcohol use: No   Drug use: No    Medications   Prior to Admission medications   Medication Sig Start Date End Date Taking? Authorizing Provider  aspirin EC 81 MG tablet Take 81  mg by mouth daily. Swallow whole.   Yes [provider]  atenolol (TENORMIN) 50 MG tablet Take 50 mg by mouth every evening.   Yes [provider]  DULoxetine (CYMBALTA) 60 MG capsule Take 60 mg by mouth every evening.   Yes [provider]  esomeprazole (NEXIUM) 40 MG capsule Take 40 mg by mouth daily at 12 noon.   Yes [provider]  letrozole (FEMARA) 2.5 MG tablet Take 1 tablet (2.5 mg total) by mouth daily. 07/15/23  Yes Causey, Larna Daughters, NP  lisinopril-hydrochlorothiazide  (ZESTORETIC) 20-25 MG tablet Take 1 tablet by mouth daily. 04/06/22  Yes [provider]  topiramate (TOPAMAX) 50 MG tablet Take 50 mg by mouth daily.   Yes [provider]  Multiple Vitamin (MULTIVITAMIN WITH MINERALS) TABS tablet Take 1 tablet by mouth every evening.    [provider]  oxyCODONE (OXY IR/ROXICODONE) 5 MG immediate release tablet Take 1 tablet (5 mg total) by mouth every 6 (six) hours as needed for severe pain (pain score 7-10). 08/06/23   Almond Lint, MD    Allergies   Allergies  Allergen Reactions   Flagyl [Metronidazole] Nausea And Vomiting and Other (See Comments)    headache    Review of Systems  ROS  Neurologic Exam  Awake, alert, oriented Memory and concentration grossly intact Speech fluent, appropriate CN grossly intact Motor exam: Upper Extremities Deltoid Bicep Tricep Grip  Right 5/5 5/5 5/5 5/5  Left 5/5 5/5 5/5 5/5   Lower Extremities IP Quad PF DF EHL  Right 5/5 5/5 5/5 5/5 5/5  Left 5/5 5/5 5/5 5/5 5/5   Sensation grossly intact to LT  Impression  - 68 y.o. female with breast CA and incidentally discovered RICA aneurysm  Plan  - Will proceed with diagnostic cerebral angiogram  I have reviewed the indications for the procedure as well as the details of the procedure and the expected postoperative course and recovery at length with the patient in the office. We have also reviewed in detail the risks, benefits, and alternatives to the procedure. All questions were answered and [name] provided informed consent to proceed.  Lisbeth Renshaw, MD [facility]

## 2023-08-20 NOTE — Discharge Instructions (Signed)
DRINK PLENTY OF FLUIDS OVER THE NEXT 2-3 DAYS. 

## 2023-08-20 NOTE — Progress Notes (Signed)
Patient ambulated to BR. Right groin remains unremarkable.

## 2023-08-20 NOTE — Op Note (Signed)
 ENDOVASCULAR NEUROSURGERY OPERATIVE NOTE   PROCEDURE: Diagnostic Cerebral Angiogram   OPERATOR:   Dr. Lisbeth Renshaw, MD  HISTORY:   The patient is a 68 y.o. yo female with a history of breast cancer.  She was complaining of some headache and had discovery of a right ophthalmic aneurysm after MRI.  She was therefore referred for neurosurgical evaluation and comes in today for further workup with diagnostic cerebral angiogram.  APPROACH:   The technical aspects of the procedure as well as its potential risks and benefits were reviewed with the patient. These risks included but were not limited bleeding, infection, allergic reaction, damage to organs/vital structures, stroke, non-diagnostic procedure, and the catastrophic outcomes of heart attack, coma, and death. With an understanding of these risks, informed consent was obtained and witnessed.    The patient was placed in the supine position on the angiography table and the skin of right groin prepped in the usual sterile fashion. The procedure was performed under local anesthesia (1%-solution of bicarbonate-bufferred Lidoacaine) and conscious sedation with Versed and fentanyl monitored by the in-suite nurse and myself, including non-invasive blood pressure and continuous pulse oxymetry.    Access to the right common femoral artery was obtained using standard Seldinger technique and a 5 French sheath was placed.  Connected to continuous heparinized saline flush.  HEPARIN:  2000 Units total.    CONTRAST AGENT:  See IR records   FLUOROSCOPY TIME:  See IR records    CATHETER(S) AND WIRE(S):    5-French JB-1 glidecatheter   0.035" glidewire    VESSELS CATHETERIZED:   Right internal carotid   Left common carotid   Left vertebral   Right common femoral  VESSELS STUDIED:   Right internal carotid, head Left common carotid, head Left vertebral Right femoral  PROCEDURAL NARRATIVE:   A 5-Fr JB-1 terumo glide catheter was  advanced over a 0.035 glidewire into the aortic arch. The above vessels were then sequentially catheterized and cervical/cerebral angiograms taken. After review of images, the catheter was removed without incident.    INTERPRETATION:   Right internal carotid, head:   Injection reveals the presence of a widely patent ICA, M1, and A1 segments and their branches.  There is a large laterally projecting aneurysm arising from the ophthalmic segment of the right internal carotid.  This aneurysm measures approximately 11 mm craniocaudal, 7 mm medial-lateral, and 8.4 mm anteroposterior.  The parenchymal and venous phases are unremarkable. The venous sinuses are widely patent.    Left common carotid, head:   Injection reveals the presence of a widely patent ICA, A1, and M1 segments and their branches.  There is a small aneurysm projecting superiorly just distal to the origin of the ophthalmic measuring approximately 2.3 mm tall by 1.9 mm wide.  The parenchymal and venous phases are unremarkable. The venous sinuses are widely patent.    Left vertebral:   Injection reveals the presence of a widely patent vertebral artery. This leads to a widely patent basilar artery that terminates in the right P1, while the left P1 appears hypoplastic. The basilar apex is normal. No aneurysms, arteriovenous malformations, or high flow fistulas are visualized. The parenchymal and venous phases are normal. The venous sinuses are patent.    Right vertebral:    Normal vessel. No PICA aneurysm. See basilar description above.    Right femoral:    Normal vessel. No significant atherosclerotic disease. Arterial sheath in adequate position.   DISPOSITION:  Upon completion of the study, the femoral sheath  was removed and hemostasis obtained using a 5-Fr exoseal closure device. Good proximal and distal lower extremity pulses were documented upon achievement of hemostasis. The procedure was well tolerated and no early complications were  observed.  The patient was transferred to the recovery area to be positioned flat in bed for 3 hours.    IMPRESSION:  1.  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

## 2023-08-29 ENCOUNTER — Encounter (HOSPITAL_COMMUNITY): Payer: Self-pay

## 2023-09-04 DIAGNOSIS — I671 Cerebral aneurysm, nonruptured: Secondary | ICD-10-CM | POA: Diagnosis not present

## 2023-09-11 ENCOUNTER — Other Ambulatory Visit (HOSPITAL_COMMUNITY): Payer: Self-pay | Admitting: Neurosurgery

## 2023-09-11 DIAGNOSIS — I671 Cerebral aneurysm, nonruptured: Secondary | ICD-10-CM

## 2023-09-12 ENCOUNTER — Other Ambulatory Visit: Payer: Self-pay | Admitting: Neurosurgery

## 2023-09-12 NOTE — Pre-Procedure Instructions (Signed)
 Surgical Instructions   Your procedure is scheduled on Sep 19, 2023. Report to [facility] Main Entrance "A" at 11:00 A.M., then check in with the Admitting office. Any questions or running late day of surgery: call [phone]  Questions prior to your surgery date: call [phone], Monday-Friday, 8am-4pm. If you experience any cold or flu symptoms such as cough, fever, chills, shortness of breath, etc. between now and your scheduled surgery, please notify us  at the above number.     Remember:  Do not eat after midnight the night before your surgery   You may drink clear liquids until 10:00 AM the morning of your surgery.   Clear liquids allowed are: Water, Non-Citrus Juices (without pulp), Carbonated Beverages, Clear Tea (no milk, honey, etc.), Black Coffee Only (NO MILK, CREAM OR POWDERED CREAMER of any kind), and Gatorade.    Take these medicines the morning of surgery with A SIP OF WATER: esomeprazole (NEXIUM)  letrozole  (FEMARA )  topiramate  (TOPAMAX )    May take these medicines IF NEEDED: oxyCODONE  (OXY IR/ROXICODONE )    Follow your surgeon's instructions on when to stop Aspirin.  If no instructions were given by your surgeon then you will need to call the office to get those instructions.     One week prior to surgery, STOP taking any Aleve, Naproxen, Ibuprofen, Motrin, Advil, Goody's, BC's, all herbal medications, fish oil, and non-prescription vitamins.                     Do NOT Smoke (Tobacco/Vaping) for 24 hours prior to your procedure.  If you use a CPAP at night, you may bring your mask/headgear for your overnight stay.   You will be asked to remove any contacts, glasses, piercing's, hearing aid's, dentures/partials prior to surgery. Please bring cases for these items if needed.    Patients discharged the day of surgery will not be allowed to drive home, and someone needs to stay with them for 24 hours.  SURGICAL WAITING ROOM VISITATION Patients may have no  more than 2 support people in the waiting area - these visitors may rotate.   Pre-op nurse will coordinate an appropriate time for 1 ADULT support person, who may not rotate, to accompany patient in pre-op.  Children under the age of 34 must have an adult with them who is not the patient and must remain in the main waiting area with an adult.  If the patient needs to stay at the hospital during part of their recovery, the visitor guidelines for inpatient rooms apply.  Please refer to the [facility] website for the visitor guidelines for any additional information.   If you received a COVID test during your pre-op visit  it is requested that you wear a mask when out in public, stay away from anyone that may not be feeling well and notify your surgeon if you develop symptoms. If you have been in contact with anyone that has tested positive in the last 10 days please notify you surgeon.      Pre-operative CHG Bathing Instructions   You can play a key role in reducing the risk of infection after surgery. Your skin needs to be as free of germs as possible. You can reduce the number of germs on your skin by washing with CHG (chlorhexidine  gluconate) soap before surgery. CHG is an antiseptic soap that kills germs and continues to kill germs even after washing.   DO NOT use if you have an allergy to chlorhexidine /CHG  or antibacterial soaps. If your skin becomes reddened or irritated, stop using the CHG and notify one of our RNs at [phone].              TAKE A SHOWER THE NIGHT BEFORE SURGERY AND THE DAY OF SURGERY    Please keep in mind the following:  DO NOT shave, including legs and underarms, 48 hours prior to surgery.   You may shave your face before/day of surgery.  Place clean sheets on your bed the night before surgery Use a clean washcloth (not used since being washed) for each shower. DO NOT sleep with pet's night before surgery.  CHG Shower Instructions:  Wash your face and  private area with normal soap. If you choose to wash your hair, wash first with your normal shampoo.  After you use shampoo/soap, rinse your hair and body thoroughly to remove shampoo/soap residue.  Turn the water OFF and apply half the bottle of CHG soap to a CLEAN washcloth.  Apply CHG soap ONLY FROM YOUR NECK DOWN TO YOUR TOES (washing for 3-5 minutes)  DO NOT use CHG soap on face, private areas, open wounds, or sores.  Pay special attention to the area where your surgery is being performed.  If you are having back surgery, having someone wash your back for you may be helpful. Wait 2 minutes after CHG soap is applied, then you may rinse off the CHG soap.  Pat dry with a clean towel  Put on clean pajamas    Additional instructions for the day of surgery: DO NOT APPLY any lotions, deodorants, cologne, or perfumes.   Do not wear jewelry or makeup Do not wear nail polish, gel polish, artificial nails, or any other type of covering on natural nails (fingers and toes) Do not bring valuables to the hospital. [facility] is not responsible for valuables/personal belongings. Put on clean/comfortable clothes.  Please brush your teeth.  Ask your nurse before applying any prescription medications to the skin.

## 2023-09-13 ENCOUNTER — Encounter (HOSPITAL_COMMUNITY): Payer: Self-pay

## 2023-09-13 ENCOUNTER — Encounter (HOSPITAL_COMMUNITY)
Admission: RE | Admit: 2023-09-13 | Discharge: 2023-09-13 | Disposition: A | Discharge status: Home / Self Care | Source: Ambulatory Visit | Attending: Neurosurgery | Admitting: Neurosurgery

## 2023-09-13 ENCOUNTER — Other Ambulatory Visit: Payer: Self-pay

## 2023-09-13 VITALS — BP 107/61 | HR 72 | Temp 98.2°F | Resp 17 | Ht 67.0 in | Wt 227.5 lb

## 2023-09-13 DIAGNOSIS — Z01812 Encounter for preprocedural laboratory examination: Secondary | ICD-10-CM | POA: Diagnosis not present

## 2023-09-13 DIAGNOSIS — Z01818 Encounter for other preprocedural examination: Secondary | ICD-10-CM | POA: Diagnosis present

## 2023-09-13 HISTORY — DX: Anemia, unspecified: D64.9

## 2023-09-13 LAB — PROTIME-INR
INR: 1 (ref 0.8–1.2)
Prothrombin Time: 13.6 s (ref 11.4–15.2)

## 2023-09-13 LAB — CBC WITH DIFFERENTIAL/PLATELET
Abs Immature Granulocytes: 0.02 10*3/uL (ref 0.00–0.07)
Basophils Absolute: 0 10*3/uL (ref 0.0–0.1)
Basophils Relative: 1 %
Eosinophils Absolute: 0.1 10*3/uL (ref 0.0–0.5)
Eosinophils Relative: 2 %
HCT: 33.6 % — ABNORMAL LOW (ref 36.0–46.0)
Hemoglobin: 10.5 g/dL — ABNORMAL LOW (ref 12.0–15.0)
Immature Granulocytes: 0 %
Lymphocytes Relative: 31 %
Lymphs Abs: 1.7 10*3/uL (ref 0.7–4.0)
MCH: 27.3 pg (ref 26.0–34.0)
MCHC: 31.3 g/dL (ref 30.0–36.0)
MCV: 87.3 fL (ref 80.0–100.0)
Monocytes Absolute: 0.6 10*3/uL (ref 0.1–1.0)
Monocytes Relative: 11 %
Neutro Abs: 3 10*3/uL (ref 1.7–7.7)
Neutrophils Relative %: 55 %
Platelets: 329 10*3/uL (ref 150–400)
RBC: 3.85 MIL/uL — ABNORMAL LOW (ref 3.87–5.11)
RDW: 16.3 % — ABNORMAL HIGH (ref 11.5–15.5)
WBC: 5.5 10*3/uL (ref 4.0–10.5)
nRBC: 0 % (ref 0.0–0.2)

## 2023-09-13 LAB — BASIC METABOLIC PANEL WITH GFR
Anion gap: 10 (ref 5–15)
BUN: 16 mg/dL (ref 8–23)
CO2: 26 mmol/L (ref 22–32)
Calcium: 9.9 mg/dL (ref 8.9–10.3)
Chloride: 103 mmol/L (ref 98–111)
Creatinine, Ser: 1.23 mg/dL — ABNORMAL HIGH (ref 0.44–1.00)
GFR, Estimated: 48 mL/min — ABNORMAL LOW (ref >60)
Glucose, Bld: 95 mg/dL (ref 70–99)
Potassium: 3.9 mmol/L (ref 3.5–5.1)
Sodium: 139 mmol/L (ref 135–145)

## 2023-09-13 LAB — URINALYSIS, ROUTINE W REFLEX MICROSCOPIC
Bacteria, UA: NONE SEEN
Bilirubin Urine: NEGATIVE
Glucose, UA: NEGATIVE mg/dL
Hgb urine dipstick: NEGATIVE
Ketones, ur: NEGATIVE mg/dL
Nitrite: NEGATIVE
Protein, ur: NEGATIVE mg/dL
Specific Gravity, Urine: 1.01 (ref 1.005–1.030)
pH: 6 (ref 5.0–8.0)

## 2023-09-13 LAB — TYPE AND SCREEN
ABO/RH(D): O POS
Antibody Screen: NEGATIVE

## 2023-09-13 LAB — APTT: aPTT: 30 s (ref 24–36)

## 2023-09-13 NOTE — Pre-Procedure Instructions (Signed)
 Surgical Instructions   Your procedure is scheduled on Thursday, Sep 19, 2023. Report to [facility] Main Entrance "A" at 11:00 A.M., then check in with the Admitting office. Any questions or running late day of surgery: call [phone]  Questions prior to your surgery date: call [phone], Monday-Friday, 8am-4pm. If you experience any cold or flu symptoms such as cough, fever, chills, shortness of breath, etc. between now and your scheduled surgery, please notify us  at the above number.     Remember:  Do not eat or drink after midnight the night before your surgery   Take these medicines the morning of surgery with A SIP OF WATER: esomeprazole (NEXIUM)  letrozole  (FEMARA )  topiramate  (TOPAMAX )  Aspirin Clopidogrel (Plavix)  One week prior to surgery, STOP taking any Aleve, Naproxen, Ibuprofen, Motrin, Advil, Goody's, BC's, all herbal medications, fish oil, and non-prescription vitamins.                     Do NOT Smoke (Tobacco/Vaping) for 24 hours prior to your procedure.  If you use a CPAP at night, you may bring your mask/headgear for your overnight stay.   You will be asked to remove any contacts, glasses, piercing's, hearing aid's, dentures/partials prior to surgery. Please bring cases for these items if needed.    Patients discharged the day of surgery will not be allowed to drive home, and someone needs to stay with them for 24 hours.  SURGICAL WAITING ROOM VISITATION Patients may have no more than 2 support people in the waiting area - these visitors may rotate.   Pre-op nurse will coordinate an appropriate time for 1 ADULT support person, who may not rotate, to accompany patient in pre-op.  Children under the age of 37 must have an adult with them who is not the patient and must remain in the main waiting area with an adult.  If the patient needs to stay at the hospital during part of their recovery, the visitor guidelines for inpatient rooms apply.  Please refer  to the [facility] website for the visitor guidelines for any additional information.   If you received a COVID test during your pre-op visit  it is requested that you wear a mask when out in public, stay away from anyone that may not be feeling well and notify your surgeon if you develop symptoms. If you have been in contact with anyone that has tested positive in the last 10 days please notify you surgeon.      Pre-operative CHG Bathing Instructions   You can play a key role in reducing the risk of infection after surgery. Your skin needs to be as free of germs as possible. You can reduce the number of germs on your skin by washing with CHG (chlorhexidine  gluconate) soap before surgery. CHG is an antiseptic soap that kills germs and continues to kill germs even after washing.   DO NOT use if you have an allergy to chlorhexidine /CHG or antibacterial soaps. If your skin becomes reddened or irritated, stop using the CHG and notify one of our RNs at [phone].              TAKE A SHOWER THE NIGHT BEFORE SURGERY AND THE DAY OF SURGERY    Please keep in mind the following:  DO NOT shave, including legs and underarms, 48 hours prior to surgery.   You may shave your face before/day of surgery.  Place clean sheets on your bed the night before surgery  Use a clean washcloth (not used since being washed) for each shower. DO NOT sleep with pet's night before surgery.  CHG Shower Instructions:  Wash your face and private area with normal soap. If you choose to wash your hair, wash first with your normal shampoo.  After you use shampoo/soap, rinse your hair and body thoroughly to remove shampoo/soap residue.  Turn the water OFF and apply half the bottle of CHG soap to a CLEAN washcloth.  Apply CHG soap ONLY FROM YOUR NECK DOWN TO YOUR TOES (washing for 3-5 minutes)  DO NOT use CHG soap on face, private areas, open wounds, or sores.  Pay special attention to the area where your surgery is being  performed.  If you are having back surgery, having someone wash your back for you may be helpful. Wait 2 minutes after CHG soap is applied, then you may rinse off the CHG soap.  Pat dry with a clean towel  Put on clean pajamas    Additional instructions for the day of surgery: DO NOT APPLY any lotions, deodorants, cologne, or perfumes.   Do not wear jewelry or makeup Do not wear nail polish, gel polish, artificial nails, or any other type of covering on natural nails (fingers and toes) Do not bring valuables to the hospital. [facility] is not responsible for valuables/personal belongings. Put on clean/comfortable clothes.  Please brush your teeth.  Ask your nurse before applying any prescription medications to the skin.

## 2023-09-13 NOTE — Progress Notes (Signed)
 PCP - Dr. Alvia Awkward Cardiologist - denies  Chest x-ray - n/a EKG - 08/02/2023 Stress Test - 12/06/2020 ECHO - 05/20/2023 Cardiac Cath - 12/12/2006  Sleep Study - denies CPAP -   Fasting Blood Sugar - n/a  Checks Blood Sugar _____ times a day  Last dose of GLP1 agonist- n/a   GLP1 instructions:   Blood Thinner Instructions: Pt reports per MD to take Plavix and ASA starting 7 days prior to surgery including DOS.   ERAS Protcol - NPO   Anesthesia review:   Patient denies shortness of breath, fever, cough and chest pain at PAT appointment

## 2023-09-19 ENCOUNTER — Other Ambulatory Visit: Payer: Self-pay

## 2023-09-19 ENCOUNTER — Encounter (HOSPITAL_COMMUNITY)
Admission: RE | Disposition: A | Discharge status: Home / Self Care | Payer: Self-pay | Source: Home / Self Care | Attending: Neurosurgery

## 2023-09-19 ENCOUNTER — Encounter (HOSPITAL_COMMUNITY): Payer: Self-pay | Admitting: Neurosurgery

## 2023-09-19 ENCOUNTER — Inpatient Hospital Stay (HOSPITAL_COMMUNITY)
Admission: RE | Admit: 2023-09-19 | Discharge: 2023-09-19 | Disposition: A | Discharge status: Home / Self Care | Source: Ambulatory Visit | Attending: Neurosurgery | Admitting: Neurosurgery

## 2023-09-19 ENCOUNTER — Inpatient Hospital Stay (HOSPITAL_COMMUNITY): Admitting: Anesthesiology

## 2023-09-19 ENCOUNTER — Inpatient Hospital Stay (HOSPITAL_COMMUNITY)
Admission: RE | Admit: 2023-09-19 | Discharge: 2023-09-20 | DRG: 254 | Disposition: A | Discharge status: Home / Self Care | Attending: Neurosurgery | Admitting: Neurosurgery

## 2023-09-19 DIAGNOSIS — C50411 Malignant neoplasm of upper-outer quadrant of right female breast: Secondary | ICD-10-CM | POA: Diagnosis present

## 2023-09-19 DIAGNOSIS — Z87891 Personal history of nicotine dependence: Secondary | ICD-10-CM

## 2023-09-19 DIAGNOSIS — Z803 Family history of malignant neoplasm of breast: Secondary | ICD-10-CM | POA: Diagnosis not present

## 2023-09-19 DIAGNOSIS — I671 Cerebral aneurysm, nonruptured: Secondary | ICD-10-CM | POA: Diagnosis not present

## 2023-09-19 DIAGNOSIS — Z170 Estrogen receptor positive status [ER+]: Secondary | ICD-10-CM

## 2023-09-19 DIAGNOSIS — Z823 Family history of stroke: Secondary | ICD-10-CM

## 2023-09-19 DIAGNOSIS — E785 Hyperlipidemia, unspecified: Secondary | ICD-10-CM

## 2023-09-19 DIAGNOSIS — Z79899 Other long term (current) drug therapy: Secondary | ICD-10-CM

## 2023-09-19 DIAGNOSIS — Z8249 Family history of ischemic heart disease and other diseases of the circulatory system: Secondary | ICD-10-CM

## 2023-09-19 DIAGNOSIS — Z90710 Acquired absence of both cervix and uterus: Secondary | ICD-10-CM

## 2023-09-19 DIAGNOSIS — Z8051 Family history of malignant neoplasm of kidney: Secondary | ICD-10-CM

## 2023-09-19 DIAGNOSIS — M797 Fibromyalgia: Secondary | ICD-10-CM | POA: Diagnosis not present

## 2023-09-19 DIAGNOSIS — Z841 Family history of disorders of kidney and ureter: Secondary | ICD-10-CM

## 2023-09-19 DIAGNOSIS — Z800 Family history of malignant neoplasm of digestive organs: Secondary | ICD-10-CM | POA: Diagnosis not present

## 2023-09-19 DIAGNOSIS — I10 Essential (primary) hypertension: Secondary | ICD-10-CM | POA: Diagnosis not present

## 2023-09-19 DIAGNOSIS — Z7982 Long term (current) use of aspirin: Secondary | ICD-10-CM | POA: Diagnosis not present

## 2023-09-19 DIAGNOSIS — I720 Aneurysm of carotid artery: Principal | ICD-10-CM | POA: Diagnosis present

## 2023-09-19 DIAGNOSIS — Z79811 Long term (current) use of aromatase inhibitors: Secondary | ICD-10-CM | POA: Diagnosis not present

## 2023-09-19 DIAGNOSIS — Z7902 Long term (current) use of antithrombotics/antiplatelets: Secondary | ICD-10-CM

## 2023-09-19 DIAGNOSIS — E669 Obesity, unspecified: Secondary | ICD-10-CM | POA: Diagnosis present

## 2023-09-19 DIAGNOSIS — Z6835 Body mass index (BMI) 35.0-35.9, adult: Secondary | ICD-10-CM | POA: Diagnosis not present

## 2023-09-19 DIAGNOSIS — K219 Gastro-esophageal reflux disease without esophagitis: Secondary | ICD-10-CM | POA: Diagnosis not present

## 2023-09-19 HISTORY — PX: RADIOLOGY WITH ANESTHESIA: SHX6223

## 2023-09-19 HISTORY — PX: IR ANGIO INTRA EXTRACRAN SEL INTERNAL CAROTID UNI R MOD SED: IMG5362

## 2023-09-19 HISTORY — PX: IR ANGIOGRAM FOLLOW UP STUDY: IMG697

## 2023-09-19 HISTORY — PX: IR TRANSCATH/EMBOLIZ: IMG695

## 2023-09-19 LAB — ABO/RH: ABO/RH(D): O POS

## 2023-09-19 LAB — MRSA NEXT GEN BY PCR, NASAL: MRSA by PCR Next Gen: NOT DETECTED

## 2023-09-19 SURGERY — RADIOLOGY WITH ANESTHESIA
Anesthesia: General

## 2023-09-19 MED ORDER — ONDANSETRON HCL 4 MG/2ML IJ SOLN: 4.0000 mg | Freq: Once | INTRAMUSCULAR | Status: DC | PRN

## 2023-09-19 MED ORDER — SODIUM CHLORIDE 0.9 % IV SOLN: INTRAVENOUS | Status: DC | PRN

## 2023-09-19 MED ORDER — FENTANYL CITRATE (PF) 250 MCG/5ML IJ SOLN
INTRAMUSCULAR | Status: DC | PRN
  Administered 2023-09-19: 100 ug via INTRAVENOUS

## 2023-09-19 MED ORDER — CHLORHEXIDINE GLUCONATE CLOTH 2 % EX PADS: 6.0000 | MEDICATED_PAD | Freq: Once | CUTANEOUS | Status: DC

## 2023-09-19 MED ORDER — CLEVIDIPINE BUTYRATE 0.5 MG/ML IV EMUL
INTRAVENOUS | Status: DC | PRN
  Administered 2023-09-19: 2 mg/h via INTRAVENOUS

## 2023-09-19 MED ORDER — CEFAZOLIN SODIUM-DEXTROSE 2-4 GM/100ML-% IV SOLN
2.0000 g | INTRAVENOUS | Status: AC
  Administered 2023-09-19: 2 g via INTRAVENOUS
  Filled 2023-09-19: qty 100

## 2023-09-19 MED ORDER — DEXAMETHASONE SODIUM PHOSPHATE 10 MG/ML IJ SOLN
INTRAMUSCULAR | Status: DC | PRN
  Administered 2023-09-19: 5 mg via INTRAVENOUS

## 2023-09-19 MED ORDER — AMISULPRIDE (ANTIEMETIC) 5 MG/2ML IV SOLN: 10.0000 mg | Freq: Once | INTRAVENOUS | Status: DC | PRN

## 2023-09-19 MED ORDER — FENTANYL CITRATE (PF) 100 MCG/2ML IJ SOLN
INTRAMUSCULAR | Status: AC
  Filled 2023-09-19: qty 2

## 2023-09-19 MED ORDER — HEPARIN SODIUM (PORCINE) 1000 UNIT/ML IJ SOLN
INTRAMUSCULAR | Status: DC | PRN
  Administered 2023-09-19: 5000 [IU] via INTRAVENOUS

## 2023-09-19 MED ORDER — CLOPIDOGREL BISULFATE 75 MG PO TABS
75.0000 mg | ORAL_TABLET | Freq: Every day | ORAL | Status: DC
  Administered 2023-09-20: 75 mg via ORAL
  Filled 2023-09-19: qty 1

## 2023-09-19 MED ORDER — LIDOCAINE HCL (CARDIAC) PF 100 MG/5ML IV SOSY
PREFILLED_SYRINGE | INTRAVENOUS | Status: DC | PRN
  Administered 2023-09-19: 80 mg via INTRAVENOUS

## 2023-09-19 MED ORDER — CLEVIDIPINE BUTYRATE 0.5 MG/ML IV EMUL
INTRAVENOUS | Status: AC
  Filled 2023-09-19: qty 50

## 2023-09-19 MED ORDER — DULOXETINE HCL 60 MG PO CPEP
60.0000 mg | ORAL_CAPSULE | Freq: Every evening | ORAL | Status: DC
  Administered 2023-09-19: 60 mg via ORAL
  Filled 2023-09-19: qty 1

## 2023-09-19 MED ORDER — ORAL CARE MOUTH RINSE: 15.0000 mL | Freq: Once | OROMUCOSAL | Status: AC

## 2023-09-19 MED ORDER — LETROZOLE 2.5 MG PO TABS
2.5000 mg | ORAL_TABLET | Freq: Every day | ORAL | Status: DC
  Administered 2023-09-20: 2.5 mg via ORAL
  Filled 2023-09-19: qty 1

## 2023-09-19 MED ORDER — PANTOPRAZOLE SODIUM 40 MG PO TBEC
40.0000 mg | DELAYED_RELEASE_TABLET | Freq: Every day | ORAL | Status: DC
  Administered 2023-09-19 – 2023-09-20 (×2): 40 mg via ORAL
  Filled 2023-09-19 (×2): qty 1

## 2023-09-19 MED ORDER — CHLORHEXIDINE GLUCONATE 0.12 % MT SOLN
15.0000 mL | Freq: Once | OROMUCOSAL | Status: AC
  Administered 2023-09-19: 15 mL via OROMUCOSAL
  Filled 2023-09-19: qty 15

## 2023-09-19 MED ORDER — MORPHINE SULFATE (PF) 2 MG/ML IV SOLN
1.0000 mg | INTRAVENOUS | Status: DC | PRN
  Administered 2023-09-20 (×2): 2 mg via INTRAVENOUS
  Filled 2023-09-19 (×2): qty 1

## 2023-09-19 MED ORDER — IOHEXOL 300 MG/ML  SOLN
150.0000 mL | Freq: Once | INTRAMUSCULAR | Status: AC | PRN
  Administered 2023-09-19: 50 mL via INTRA_ARTERIAL

## 2023-09-19 MED ORDER — ONDANSETRON HCL 4 MG/2ML IJ SOLN
INTRAMUSCULAR | Status: DC | PRN
Start: 2023-09-19 — End: 2023-09-19
  Administered 2023-09-19: 4 mg via INTRAVENOUS

## 2023-09-19 MED ORDER — ROCURONIUM BROMIDE 10 MG/ML (PF) SYRINGE
PREFILLED_SYRINGE | INTRAVENOUS | Status: DC | PRN
  Administered 2023-09-19: 60 mg via INTRAVENOUS
  Administered 2023-09-19: 10 mg via INTRAVENOUS

## 2023-09-19 MED ORDER — ONDANSETRON HCL 4 MG PO TABS: 4.0000 mg | ORAL_TABLET | ORAL | Status: DC | PRN

## 2023-09-19 MED ORDER — TOPIRAMATE 25 MG PO TABS
50.0000 mg | ORAL_TABLET | Freq: Every day | ORAL | Status: DC
  Administered 2023-09-20: 50 mg via ORAL
  Filled 2023-09-19: qty 2

## 2023-09-19 MED ORDER — ATENOLOL 50 MG PO TABS
50.0000 mg | ORAL_TABLET | Freq: Every evening | ORAL | Status: DC
Start: 2023-09-19 — End: 2023-09-20
  Administered 2023-09-19: 50 mg via ORAL
  Filled 2023-09-19: qty 1

## 2023-09-19 MED ORDER — IOHEXOL 300 MG/ML  SOLN: 150.0000 mL | Freq: Once | INTRAMUSCULAR | Status: DC | PRN

## 2023-09-19 MED ORDER — ASPIRIN 81 MG PO TBEC
81.0000 mg | DELAYED_RELEASE_TABLET | Freq: Every day | ORAL | Status: DC
  Administered 2023-09-20: 81 mg via ORAL
  Filled 2023-09-19: qty 1

## 2023-09-19 MED ORDER — SODIUM CHLORIDE 0.9 % IV SOLN: INTRAVENOUS | Status: DC

## 2023-09-19 MED ORDER — LABETALOL HCL 5 MG/ML IV SOLN: 10.0000 mg | INTRAVENOUS | Status: DC | PRN

## 2023-09-19 MED ORDER — SUGAMMADEX SODIUM 200 MG/2ML IV SOLN
INTRAVENOUS | Status: DC | PRN
  Administered 2023-09-19: 200 mg via INTRAVENOUS

## 2023-09-19 MED ORDER — HYDROCHLOROTHIAZIDE 25 MG PO TABS
25.0000 mg | ORAL_TABLET | Freq: Every day | ORAL | Status: DC
  Administered 2023-09-19: 25 mg via ORAL
  Filled 2023-09-19: qty 1

## 2023-09-19 MED ORDER — ADULT MULTIVITAMIN W/MINERALS CH
1.0000 | ORAL_TABLET | Freq: Every evening | ORAL | Status: DC
  Administered 2023-09-19: 1 via ORAL
  Filled 2023-09-19: qty 1

## 2023-09-19 MED ORDER — FENTANYL CITRATE (PF) 100 MCG/2ML IJ SOLN: 25.0000 ug | INTRAMUSCULAR | Status: DC | PRN

## 2023-09-19 MED ORDER — PROPOFOL 10 MG/ML IV BOLUS
INTRAVENOUS | Status: DC | PRN
  Administered 2023-09-19: 20 mg via INTRAVENOUS
  Administered 2023-09-19: 140 mg via INTRAVENOUS

## 2023-09-19 MED ORDER — ONDANSETRON HCL 4 MG/2ML IJ SOLN: 4.0000 mg | INTRAMUSCULAR | Status: DC | PRN

## 2023-09-19 MED ORDER — HYDROCODONE-ACETAMINOPHEN 5-325 MG PO TABS
1.0000 | ORAL_TABLET | ORAL | Status: DC | PRN
  Administered 2023-09-19 – 2023-09-20 (×3): 1 via ORAL
  Filled 2023-09-19 (×3): qty 1

## 2023-09-19 MED ORDER — LISINOPRIL 20 MG PO TABS
20.0000 mg | ORAL_TABLET | Freq: Every day | ORAL | Status: DC
  Administered 2023-09-19: 20 mg via ORAL
  Filled 2023-09-19: qty 1

## 2023-09-19 MED ORDER — ACETAMINOPHEN 10 MG/ML IV SOLN: 1000.0000 mg | Freq: Once | INTRAVENOUS | Status: DC | PRN

## 2023-09-19 MED ORDER — LISINOPRIL-HYDROCHLOROTHIAZIDE 20-25 MG PO TABS: 1.0000 | ORAL_TABLET | Freq: Every day | ORAL | Status: DC

## 2023-09-19 NOTE — Anesthesia Preprocedure Evaluation (Addendum)
 Anesthesia Evaluation  Patient identified by MRN, date of birth, ID band Patient awake    Reviewed: Allergy & Precautions, NPO status , Patient's Chart, lab work & pertinent test results  Airway Mallampati: II  TM Distance: >3 FB Neck ROM: Full    Dental no notable dental hx.    Pulmonary former smoker   Pulmonary exam normal        Cardiovascular hypertension, Pt. on home beta blockers and Pt. on medications Normal cardiovascular exam     Neuro/Psych  Headaches PSYCHIATRIC DISORDERS       Neuromuscular disease    GI/Hepatic Neg liver ROS,GERD  Medicated and Controlled,,  Endo/Other  negative endocrine ROS    Renal/GU Renal InsufficiencyRenal disease     Musculoskeletal  (+) Arthritis ,  Fibromyalgia -  Abdominal  (+) + obese  Peds  Hematology  (+) Blood dyscrasia (Plavix), anemia   Anesthesia Other Findings Right ICA aneurysm  Reproductive/Obstetrics                             Anesthesia Physical Anesthesia Plan  ASA: 3  Anesthesia Plan: General   Post-op Pain Management:    Induction: Intravenous  PONV Risk Score and Plan: 3 and Ondansetron , Dexamethasone  and Treatment may vary due to age or medical condition  Airway Management Planned: Oral ETT  Additional Equipment: Arterial line  Intra-op Plan:   Post-operative Plan: Extubation in OR  Informed Consent: I have reviewed the patients History and Physical, chart, labs and discussed the procedure including the risks, benefits and alternatives for the proposed anesthesia with the patient or authorized representative who has indicated his/her understanding and acceptance.     Dental advisory given  Plan Discussed with: CRNA  Anesthesia Plan Comments:        Anesthesia Quick Evaluation

## 2023-09-19 NOTE — Sedation Documentation (Signed)
Patient under the care of Anesthesia  

## 2023-09-19 NOTE — Anesthesia Postprocedure Evaluation (Signed)
 Anesthesia Post Note  Patient: Christy Lynch  Procedure(s) Performed: RADIOLOGY WITH ANESTHESIA     Patient location during evaluation: PACU Anesthesia Type: General Level of consciousness: awake and alert Pain management: pain level controlled Vital Signs Assessment: post-procedure vital signs reviewed and stable Respiratory status: spontaneous breathing, nonlabored ventilation, respiratory function stable and patient connected to nasal cannula oxygen Cardiovascular status: blood pressure returned to baseline and stable Postop Assessment: no apparent nausea or vomiting Anesthetic complications: no  No notable events documented.  Last Vitals:  Vitals:   09/19/23 1615 09/19/23 1635  BP: 119/65 118/66  Pulse: 63 65  Resp: 13 12  Temp: 36.7 C 36.8 C  SpO2: 99% 100%    Last Pain:  Vitals:   09/19/23 1635  TempSrc: Oral  PainSc: 5                  [name]

## 2023-09-19 NOTE — Anesthesia Procedure Notes (Signed)
 Procedure Name: Intubation Date/Time: 09/19/2023 2:05 PM  Performed by: Grier Leber, CRNAPre-anesthesia Checklist: Patient identified, Emergency Drugs available, Suction available and Patient being monitored Patient Re-evaluated:Patient Re-evaluated prior to induction Oxygen Delivery Method: Circle System Utilized Preoxygenation: Pre-oxygenation with 100% oxygen Induction Type: IV induction Ventilation: Mask ventilation without difficulty Laryngoscope Size: Mac and 3 Grade View: Grade I Tube type: Oral Tube size: 7.0 mm Number of attempts: 1 Airway Equipment and Method: Stylet Placement Confirmation: ETT inserted through vocal cords under direct vision, positive ETCO2 and breath sounds checked- equal and bilateral Secured at: 21 cm Tube secured with: Tape Dental Injury: Teeth and Oropharynx as per pre-operative assessment

## 2023-09-19 NOTE — Op Note (Signed)
 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 Augusto Blonder, MD  HISTORY:    The patient is a 68 y.o. female with a history of incidentally discovered large right ophthalmic aneurysm.  Patient has previously undergone diagnostic cerebral angiogram and was seen in the outpatient neurosurgery clinic where treatment options were discussed.  She has elected to proceed with pipeline embolization.  She has been treated with preoperative aspirin and Plavix.  APPROACH:    The technical aspects of the procedure as well as its potential risks and benefits were reviewed with the patient. These risks included but were not limited bleeding, infection, allergic reaction, damage to organs/vital structures, stroke, non-diagnostic procedure, and the catastrophic outcomes of heart attack, coma, and death. With an understanding of these risks, informed consent was obtained and witnessed.    The patient was placed in the supine position on the angiography table and the skin of right groin prepped in the usual sterile fashion. The procedure was performed under general anesthesia.  A 8- French sheath was introduced in the right common femoral artery using Seldinger technique.  A fluorophase sequence was used to document the sheath position.    HEPARIN :  5000 Units total.     CONTRAST AGENT:  See IR records    FLUOROSCOPY TIME:  See IR records   CATHETER(S) AND WIRE(S):     0.035" glidewire   80 cm 6 [place] NeuronMax guide sheath 125 cm 6 [place] Berenstein select guide catheter 115 cm 058 Cat 5 guide catheter 150 cm Phenom 27 microcatheter Synchro 2 select standard microwire  VESSELS CATHETERIZED:    Right internal carotid artery Right common femoral  VESSELS STUDIED:    Right internal carotid artery, pre-embolization Right internal carotid artery, post embolization Right internal carotid artery,  final control  PIPELINE DEVICE USED (MRI COMPATIBLE): 4.5 x 16mm Pipeline Flex with shield  PROCEDURAL NARRATIVE:   The guide sheath was introduced over the select guide catheter and 035 wire. The right internal carotid artery was then selected, and the guide sheath was advanced into the proximal cervical right internal carotid artery, just distal to the bifurcation. The select catheter was then removed without incident. The guide catheter was then introduced over the microcatheter and Synchro standard microwire. The microcatheter was used to select the right internal carotid artery, and the catalyst guide catheter was then advanced to its final position in the petrous segment of the right internal carotid. The microcatheter was then advanced into the right middle cerebral artery.attempts were then made to advance the catalyst into the cavernous internal carotid artery without success.  This is likely due to significant tortuosity in the cervical internal carotid artery.  I therefore elected to proceed with the embolization with the catalyst guide catheter in the petrous segment.  Microwire was removed and the above Pipeline device introduced. Utilizing live fluoro and single shots, the device was carefully deployed across the aneurysm. The pusher wire was then recaptured and removed. The microcatheter was then removed without incident. The guide catheter and sheath were then withdrawn into the distal cervical internal carotid artery, and final control angiogram was taken. The entire catheter construct was then removed synchronously without incident.  INTERPRETATION:    Right internal carotid artery, pre-embolization: Injection demonstrates patency of the internal carotid artery, with normal bifurcation into anterior and middle cerebral arteries. The previously described ophthalmic segment aneurysm is again identified. Capillary phase is unremarkable.  Venous phases normal.  Right internal carotid,  post-embolization: Post-deployment angiograms taken immediately and a delayed fashion reveal widely patent carotid artery. Pipeline device is widely patent with stable position and good vessel wall apposition extending from the horizontal cavernous to supraclinoid internal carotid. No filling defects are seen. There remains significant contrast stasis within the aneurysm.  Right internal carotid artery, final control: Injection demonstrates patency of the internal carotid artery, with normal bifurcation. The distal branches of the anterior cerebral and middle cerebral arteries are normal. Pipeline device is stable, without any filling defects seen. There remains significant contrast stasis within the aneurysm. Capillary phase does not demonstrate any perfusion deficits. Venous phases is unremarkable.  Right femoral:     Normal vessel. No significant atherosclerotic disease. Arterial sheath in adequate position.   DISPOSITION:   Upon completion of the study, the femoral sheath was removed and hemostasis obtained using a 8-Fr AngioSeal closure device. Good proximal and distal lower extremity pulses were documented upon achievement of hemostasis. The procedure was well tolerated and no early complications were observed.   The patient was extubated and taken to the postanesthesia care unit in stable hemodynamic condition.  IMPRESSION:   1. Successful Pipeline embolization of  a right ophthlamic segment carotid aneurysm without local thrombotic or distal embolic complication.   Augusto Blonder, MD [facility]

## 2023-09-19 NOTE — Plan of Care (Signed)
  Problem: Education: Goal: Knowledge of General Education information will improve Description: Including pain rating scale, medication(s)/side effects and non-pharmacologic comfort measures Outcome: Progressing   Problem: Health Behavior/Discharge Planning: Goal: Ability to manage health-related needs will improve Outcome: Progressing   Problem: Clinical Measurements: Goal: Ability to maintain clinical measurements within normal limits will improve Outcome: Progressing Goal: Will remain free from infection Outcome: Progressing Goal: Diagnostic test results will improve Outcome: Progressing Goal: Respiratory complications will improve Outcome: Progressing Goal: Cardiovascular complication will be avoided Outcome: Progressing   Problem: Activity: Goal: Risk for activity intolerance will decrease Outcome: Progressing   Problem: Nutrition: Goal: Adequate nutrition will be maintained Outcome: Progressing   Problem: Coping: Goal: Level of anxiety will decrease Outcome: Progressing   Problem: Elimination: Goal: Will not experience complications related to bowel motility Outcome: Progressing Goal: Will not experience complications related to urinary retention Outcome: Progressing   Problem: Pain Managment: Goal: General experience of comfort will improve and/or be controlled Outcome: Progressing   Problem: Safety: Goal: Ability to remain free from injury will improve Outcome: Progressing   Problem: Skin Integrity: Goal: Risk for impaired skin integrity will decrease Outcome: Progressing   Problem: Cardiovascular: Goal: Ability to achieve and maintain adequate cardiovascular perfusion will improve Outcome: Progressing Goal: Vascular access site(s) Level 0-1 will be maintained Outcome: Progressing

## 2023-09-19 NOTE — Progress Notes (Signed)
 Upon assessment, blood noted on gauze covering recent right femoral IR site. No oozing past the gauze. Dressing changed and active oozing was noted. Pressure held for 10 minutes and site re-dressed after. Dr. Nat Badger notified and advised to extend bedrest/HOB flat an additional 2 hours. If site bleeds again okay to use Quickclot.

## 2023-09-19 NOTE — Consult Note (Signed)
 NAME:  Christy Lynch, MRN:  [id number], DOB:  [birth year], LOS: 0 ADMISSION DATE:  09/19/2023, CONSULTATION DATE:  09/19/23 REFERRING MD:  NSGY, CHIEF COMPLAINT:  post op   History of Present Illness:   70 yof with PMH as below significant for HTN, HLD, and GERD found incidentally to have large right ophthalmic aneurysm, asymptomatic per pt but previous hx of headaches.  Pretreated with aspirin and plavix.  Underwent elective pipeline embolization of right internal carotid artery aneurysm 5/8 by Dr. Nat Badger under general anesthesia.  Extubated in PACU.  PCCM consulted for post op management in while in ICU.   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 rate 14, height 5\' 7"  (1.702 m), weight 100.8 kg, SpO2 97%.        Intake/Output Summary (Last 24 hours) at 09/19/2023 1825 Last data filed at 09/19/2023 1613 Gross per 24 hour  Intake 800 ml  Output 420 ml  Net 380 ml   Filed Weights   09/19/23 1128 09/19/23 1635  Weight: 103.1 kg 100.8 kg    Examination: General:  pleasant older female lying in bed in NAD watching TV HEENT: MM pink/moist, pupils 3/r Neuro:  Aox 4, non focal, MAE CV: rr, NSR, no murmur, left radial aline, R femoral groin site  soft, guaze saturated, dressing intact, distal pulses intact PULM:  non labored, CTA, RA GI: obese, soft, bs+, foley Extremities: warm/dry, trace ankle edema  Skin: no rashes   Preop labs 5/2> sCr 1.23, H/H 10.5/ 33.6  Resolved Hospital Problem list    Assessment & Plan:   Right ophthalmic aneurysm s/p elective pipeline embolization Hx headaches - per NSGY - SBP goal <160, management as below - passed swallow screen, clears for now, advance as tolerated - ASA/ plavix - flat for 6hrs, neurovascular/ neuro checks per protocol - multimodal pain management - keep Aline/ foley, likely d/c in am - further imaging per NSGY, stat CTH if any changes  - cont pta Topamax     HTN - would watch renal function/ UOP closely s/p angiography - prn labetalol  - s/p atenolol, lisinopril/ HTCZ this evening, would hold if sCr elevated in am   Fibromyalgia - cont Cymbalta   GERD - PPI   Best Practice (right click and "Reselect all SmartList Selections" daily)   Diet/type: clear liquids DVT prophylaxis SCD Pressure ulcer(s): N/A GI prophylaxis: N/A Lines: Arterial Line Foley:  Yes, and it is still needed Code Status:  full code Last date of multidisciplinary goals of care discussion [per primary]  Husband at bedside  Labs   CBC: Recent Labs  Lab 09/13/23 1405  WBC 5.5  NEUTROABS 3.0  HGB 10.5*  HCT 33.6*  MCV 87.3  PLT 329    Basic Metabolic Panel: Recent Labs  Lab 09/13/23 1405  NA 139  K 3.9  CL 103  CO2 26  GLUCOSE 95  BUN 16  CREATININE 1.23*  CALCIUM 9.9   GFR: Estimated Creatinine Clearance: 53.4 mL/min (A) (by C-G formula based on SCr of 1.23 mg/dL (H)). Recent Labs  Lab 09/13/23 1405  WBC 5.5    Liver Function Tests: No results for input(s): "AST", "ALT", "ALKPHOS", "BILITOT", "PROT", "ALBUMIN" in the last 168 hours. No results for input(s): "LIPASE", "AMYLASE" in the last 168 hours. No results for input(s): "AMMONIA" in the last 168  hours.  ABG    Component Value Date/Time   PHART 7.580 (H) 03/23/2013 1532   PCO2ART 22.5 (L) 03/23/2013 1532   PO2ART 55.0 (L) 03/23/2013 1532   HCO3 21.0 03/23/2013 1532   TCO2 22 03/23/2013 1532   O2SAT 93.0 03/23/2013 1532     Coagulation Profile: Recent Labs  Lab 09/13/23 1405  INR 1.0    Cardiac Enzymes: No results for input(s): "CKTOTAL", "CKMB", "CKMBINDEX", "TROPONINI" in the last 168 hours.  HbA1C: Hemoglobin A1C  Date/Time Value Ref Range Status  08/10/2017 05:02 PM 5.8%  Final    Comment:    [facility] MED ASSIST EVENT    CBG: No results for input(s): "GLUCAP" in the last 168 hours.  Review of Systems:   Review of Systems  Constitutional:  Negative for chills and fever.  Eyes:  Negative for blurred vision, double vision and photophobia.  Respiratory:  Negative for shortness of breath.   Cardiovascular:  Negative for chest pain.  Gastrointestinal:  Negative for abdominal pain, nausea and vomiting.  Neurological:  Positive for headaches. Negative for dizziness, tingling, sensory change, focal weakness, loss of consciousness and weakness.   Past Medical History:  She,  has a past medical history of Anemia, Breast Cancer (2024), Bursitis of hip, Esophageal reflux, Family history of breast cancer, Family history of colon cancer, Family history of kidney cancer, Fibromyalgia, Headache, Hypertension, and Other and unspecified hyperlipidemia.   Surgical History:   Past Surgical History:  Procedure Laterality Date   ABDOMINAL HYSTERECTOMY     BREAST BIOPSY Left    neg bx early 2000's   BREAST BIOPSY Left 04/30/2022   US  LT BREAST BX W LOC DEV 1ST LESION IMG BX SPEC US  GUIDE 04/30/2022 [facility]   BREAST BIOPSY Left 04/30/2022   US  LT BREAST BX W LOC DEV EA ADD LESION IMG BX SPEC US  GUIDE 04/30/2022 [facility]   BREAST BIOPSY  07/17/2022   MM LT RADIOACTIVE SEED LOC MAMMO GUIDE 07/17/2022 [facility]   BREAST BIOPSY  07/17/2022   MM LT  RADIOACTIVE SEED EA ADD LESION LOC MAMMO GUIDE 07/17/2022 [facility]   BREAST BIOPSY  07/17/2022   MM RT RADIOACTIVE SEED LOC MAMMO GUIDE 07/17/2022 [facility]   BREAST LUMPECTOMY WITH RADIOACTIVE SEED AND SENTINEL LYMPH NODE BIOPSY Bilateral 07/19/2022   Procedure: BILATERAL BREAST LUMPECTOMY WITH RADIOACTIVE SEED AND BILATERAL SENTINEL LYMPH NODE BIOPSY;  Surgeon: Lockie Rima, MD;  Location: [facility];  Service: General;  Laterality: Bilateral;   IR ANGIO INTRA EXTRACRAN SEL COM CAROTID INNOMINATE UNI L MOD SED  08/20/2023   IR ANGIO INTRA EXTRACRAN SEL INTERNAL CAROTID UNI R MOD SED  08/20/2023   IR ANGIO VERTEBRAL SEL VERTEBRAL UNI L MOD SED  08/20/2023   LEFT HEART CATH  12/12/2006   PORT-A-CATH REMOVAL N/A 08/06/2023  Procedure: REMOVAL PORT-A-CATH;  Surgeon: Lockie Rima, MD;  Location: [facility];  Service: General;  Laterality: N/A;   PORTACATH PLACEMENT Left 07/19/2022   Procedure: INSERTION PORT-A-CATH;  Surgeon: Lockie Rima, MD;  Location: [facility];  Service: General;  Laterality: Left;     Social History:   reports that she has quit smoking. She has never used smokeless tobacco. She reports that she does not drink alcohol and does not use drugs.   Family History:  Her family history includes Breast cancer in her cousin; Breast cancer (age of onset: 22) in her mother; Colon cancer (age of onset: 9) in her maternal uncle; Hypertension in her mother; Kidney cancer (age of onset: 2) in her brother; Kidney disease in her father; Stroke in her mother.   Allergies Allergies  Allergen Reactions   Flagyl [Metronidazole] Nausea And Vomiting and Other (See Comments)    headache     Home Medications  Prior to Admission medications   Medication Sig Start Date End Date Taking? Authorizing Provider  aspirin EC 81 MG tablet Take 81 mg by mouth daily. Swallow whole.   Yes [provider]  atenolol (TENORMIN) 50 MG tablet Take  50 mg by mouth every evening.   Yes [provider]  clopidogrel (PLAVIX) 75 MG tablet Take 75 mg by mouth See admin instructions. Take one tablet by mouth 7 days before surgery including the day of the surgery   Yes [provider]  DULoxetine (CYMBALTA) 60 MG capsule Take 60 mg by mouth every evening.   Yes [provider]  esomeprazole (NEXIUM) 40 MG capsule Take 40 mg by mouth daily at 12 noon.   Yes [provider]  letrozole  (FEMARA ) 2.5 MG tablet Take 1 tablet (2.5 mg total) by mouth daily. 07/15/23  Yes Causey, Lindsey Cornetto, NP  lisinopril-hydrochlorothiazide (ZESTORETIC) 20-25 MG tablet Take 1 tablet by mouth daily. 04/06/22  Yes [provider]  Multiple Vitamin (MULTIVITAMIN WITH MINERALS) TABS tablet Take 1 tablet by mouth every evening.   Yes [provider]  topiramate  (TOPAMAX ) 50 MG tablet Take 50 mg by mouth daily.   Yes [provider]  VITAMIN D PO Take 1 tablet by mouth daily.   Yes [provider]  oxyCODONE  (OXY IR/ROXICODONE ) 5 MG immediate release tablet Take 1 tablet (5 mg total) by mouth every 6 (six) hours as needed for severe pain (pain score 7-10). Patient not taking: Reported on 09/13/2023 08/06/23   Lockie Rima, MD     Critical care time: 60 mins       Early Glisson, MSN, AG-ACNP-BC [facility] 09/19/2023, 7:05 PM  See Amion for pager If no response to pager , please call [phone] until 7pm After 7:00 pm call Elink  [phone]

## 2023-09-19 NOTE — Anesthesia Procedure Notes (Signed)
 Arterial Line Insertion Start/End5/12/2023 12:50 PM, 09/19/2023 12:55 PM Performed by: Lethaniel Rave, MD, Grier Leber, CRNA, anesthesiologist  Patient location: Pre-op. Preanesthetic checklist: patient identified, IV checked, site marked, risks and benefits discussed, surgical consent, monitors and equipment checked, pre-op evaluation, timeout performed and anesthesia consent Lidocaine  1% used for infiltration Left, radial was placed Catheter size: 20 G Hand hygiene performed  and maximum sterile barriers used   Attempts: 1 Procedure performed using ultrasound guided technique. Ultrasound Notes:anatomy identified, needle tip was noted to be adjacent to the nerve/plexus identified and no ultrasound evidence of intravascular and/or intraneural injection Following insertion, dressing applied and Biopatch. Post procedure assessment: normal and unchanged  Patient tolerated the procedure well with no immediate complications. Additional procedure comments: 1 attempt by CRNA, unsuccessful. 1 attempt by MD, successful using U/S.Aaron Aas

## 2023-09-19 NOTE — Transfer of Care (Signed)
 Immediate Anesthesia Transfer of Care Note  Patient: Christy  C Lynch  Procedure(s) Performed: RADIOLOGY WITH ANESTHESIA  Patient Location: PACU  Anesthesia Type:General  Level of Consciousness: awake, alert , oriented, and patient cooperative  Airway & Oxygen Therapy: Patient Spontanous Breathing and Patient connected to face mask oxygen  Post-op Assessment: Report given to RN, Post -op Vital signs reviewed and stable, and Patient moving all extremities X 4  Post vital signs: Reviewed and stable  Last Vitals:  Vitals Value Taken Time  BP 114/57 09/19/23 1545  Temp    Pulse 64 09/19/23 1549  Resp 16 09/19/23 1549  SpO2 100 % 09/19/23 1549  Vitals shown include unfiled device data.  Last Pain:  Vitals:   09/19/23 1117  PainSc: 0-No pain      Patients Stated Pain Goal: 0 (09/19/23 1117)  Complications: No notable events documented.

## 2023-09-19 NOTE — H&P (Signed)
 HPI:     [name] is a 68 year old woman I am seeing in follow-up.  She recently underwent diagnostic cerebral angiogram in further workup of an incidentally discovered large right ophthalmic aneurysm.  She does not report any right leg access site issues.   Patient Active Problem List   Diagnosis Date Noted   Malignant neoplasm of upper-outer quadrant of right breast in female, estrogen receptor positive (HCC) 07/15/2023   Abnormal brain MRI 07/02/2023   Chemotherapy-induced peripheral neuropathy (HCC) 10/15/2022   Port-A-Cath in place 08/14/2022   Genetic testing 07/30/2022   Family history of breast cancer 07/18/2022   Family history of colon cancer 07/18/2022   Family history of kidney cancer 07/18/2022   Malignant neoplasm of upper-outer quadrant of left breast in female, estrogen receptor positive (HCC) 05/22/2022   Unilateral primary osteoarthritis, left knee 10/14/2019   Vertigo 08/21/2013   Chest pain 07/02/2013   Essential hypertension 07/02/2013   Hyperlipidemia 07/02/2013   Past Medical History:  Diagnosis Date   Anemia    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   Bursitis of hip    Esophageal reflux    Family history of breast cancer    Family history of colon cancer    Family history of kidney cancer    Fibromyalgia    Headache    Hypertension    Other and unspecified hyperlipidemia     Past Surgical History:  Procedure Laterality Date   ABDOMINAL HYSTERECTOMY     BREAST BIOPSY Left    neg bx early 2000's   BREAST BIOPSY Left 04/30/2022   US  LT BREAST BX W LOC DEV 1ST LESION IMG BX SPEC US  GUIDE 04/30/2022 [facility]   BREAST BIOPSY Left 04/30/2022   US  LT BREAST BX W LOC DEV EA ADD LESION IMG BX SPEC US  GUIDE 04/30/2022 [facility]   BREAST BIOPSY  07/17/2022   MM LT RADIOACTIVE SEED LOC MAMMO GUIDE 07/17/2022 [facility]   BREAST BIOPSY  07/17/2022   MM LT RADIOACTIVE SEED EA ADD LESION LOC MAMMO GUIDE 07/17/2022  [facility]   BREAST BIOPSY  07/17/2022   MM RT RADIOACTIVE SEED LOC MAMMO GUIDE 07/17/2022 [facility]   BREAST LUMPECTOMY WITH RADIOACTIVE SEED AND SENTINEL LYMPH NODE BIOPSY Bilateral 07/19/2022   Procedure: BILATERAL BREAST LUMPECTOMY WITH RADIOACTIVE SEED AND BILATERAL SENTINEL LYMPH NODE BIOPSY;  Surgeon: Lockie Rima, MD;  Location: [facility];  Service: General;  Laterality: Bilateral;   IR ANGIO INTRA EXTRACRAN SEL COM CAROTID INNOMINATE UNI L MOD SED  08/20/2023   IR ANGIO INTRA EXTRACRAN SEL INTERNAL CAROTID UNI R MOD SED  08/20/2023   IR ANGIO VERTEBRAL SEL VERTEBRAL UNI L MOD SED  08/20/2023   LEFT HEART CATH  12/12/2006   PORT-A-CATH REMOVAL N/A 08/06/2023   Procedure: REMOVAL PORT-A-CATH;  Surgeon: Lockie Rima, MD;  Location: [facility];  Service: General;  Laterality: N/A;   PORTACATH PLACEMENT Left 07/19/2022   Procedure: INSERTION PORT-A-CATH;  Surgeon: Lockie Rima, MD;  Location:  SURGERY CENTER;  Service: General;  Laterality: Left;    Medications Prior to Admission  Medication Sig Dispense Refill Last Dose/Taking   aspirin EC 81 MG tablet Take 81 mg by mouth daily. Swallow whole.   09/19/2023 at  8:30 AM   atenolol (TENORMIN) 50 MG tablet Take 50 mg by mouth every evening.   09/18/2023 Bedtime   clopidogrel (PLAVIX) 75 MG tablet Take 75 mg by mouth See  admin instructions. Take one tablet by mouth 7 days before surgery including the day of the surgery   09/19/2023 at  8:30 AM   DULoxetine (CYMBALTA) 60 MG capsule Take 60 mg by mouth every evening.   09/18/2023 Evening   esomeprazole (NEXIUM) 40 MG capsule Take 40 mg by mouth daily at 12 noon.   09/18/2023 Evening   letrozole  (FEMARA ) 2.5 MG tablet Take 1 tablet (2.5 mg total) by mouth daily. 90 tablet 3 09/19/2023 at  8:30 AM   lisinopril-hydrochlorothiazide (ZESTORETIC) 20-25 MG tablet Take 1 tablet by mouth daily.   09/18/2023   Multiple Vitamin (MULTIVITAMIN WITH MINERALS) TABS tablet Take 1  tablet by mouth every evening.   09/18/2023   topiramate  (TOPAMAX ) 50 MG tablet Take 50 mg by mouth daily.   09/19/2023 at  8:30 AM   VITAMIN D PO Take 1 tablet by mouth daily.   09/18/2023   oxyCODONE  (OXY IR/ROXICODONE ) 5 MG immediate release tablet Take 1 tablet (5 mg total) by mouth every 6 (six) hours as needed for severe pain (pain score 7-10). (Patient not taking: Reported on 09/13/2023) 5 tablet 0 Not Taking   Allergies  Allergen Reactions   Flagyl [Metronidazole] Nausea And Vomiting and Other (See Comments)    headache    Social History   Tobacco Use   Smoking status: Former   Smokeless tobacco: Never  Substance Use Topics   Alcohol use: No    Family History  Problem Relation Age of Onset   Breast cancer Mother 69   Hypertension Mother    Stroke Mother    Kidney disease Father    Kidney cancer Brother 55   Colon cancer Maternal Uncle 3   Breast cancer Cousin        maternal first cousin, dx 30s     Objective:   Patient Vitals for the past 8 hrs:  Height Weight  09/19/23 1128 5\' 7"  (1.702 m) 103.1 kg   No intake/output data recorded. No intake/output data recorded.  CN II-XII grossly intact Speech fluent/appropriate MAEs A&O x3  Assessment:  68 year old woman with an incidentally discovered large right ophthalmic aneurysm measuring approximately 11 mm.  Given the size of this aneurysm and the patient's age I think treatment is indicated.  She has a small contralateral ophthalmic aneurysm measuring 68 about 2 mm which can be monitor radiographically.  Plan:   - pipeline embolization of the right ophthalmic aneurysm today  - Patient started Plavix 75mg  every day 7d prior to procedure today. Patient is already on a daily 81 mg aspirin dose   I have reviewed the angiogram findings with the patient and her husband.  For the reasons above, I have recommended embolization with the pipeline device.  We also spoke about the need for dual anti-platelet therapy both before and  at least 6 months after the procedure.  The risks of the procedure were explained in detail including risk of stroke or hemorrhage during or immediately after the procedure.  This could result in numbness, weakness, paralysis, language difficulty, coma, death.  I also reviewed other risks of procedure including contrast reaction, nephropathy, or groin hematoma.   In addition, I talked to them about the general risks of anesthesia including heart attack, stroke, or DVT/PE.  We reviewed the expected postoperative course and recovery. The patient understood our discussion and does wish to proceed. All the patient's questions were answered.  Enijah Furr CAYLIN Mallie Linnemann, PA-C

## 2023-09-20 ENCOUNTER — Encounter (HOSPITAL_COMMUNITY): Payer: Self-pay | Admitting: Neurosurgery

## 2023-09-20 LAB — CBC
HCT: 30 % — ABNORMAL LOW (ref 36.0–46.0)
Hemoglobin: 9.6 g/dL — ABNORMAL LOW (ref 12.0–15.0)
MCH: 27.2 pg (ref 26.0–34.0)
MCHC: 32 g/dL (ref 30.0–36.0)
MCV: 85 fL (ref 80.0–100.0)
Platelets: 326 10*3/uL (ref 150–400)
RBC: 3.53 MIL/uL — ABNORMAL LOW (ref 3.87–5.11)
RDW: 16.3 % — ABNORMAL HIGH (ref 11.5–15.5)
WBC: 7.9 10*3/uL (ref 4.0–10.5)
nRBC: 0 % (ref 0.0–0.2)

## 2023-09-20 LAB — BASIC METABOLIC PANEL WITH GFR
Anion gap: 9 (ref 5–15)
BUN: 16 mg/dL (ref 8–23)
CO2: 22 mmol/L (ref 22–32)
Calcium: 9.2 mg/dL (ref 8.9–10.3)
Chloride: 105 mmol/L (ref 98–111)
Creatinine, Ser: 1.22 mg/dL — ABNORMAL HIGH (ref 0.44–1.00)
GFR, Estimated: 48 mL/min — ABNORMAL LOW (ref >60)
Glucose, Bld: 134 mg/dL — ABNORMAL HIGH (ref 70–99)
Potassium: 3.8 mmol/L (ref 3.5–5.1)
Sodium: 136 mmol/L (ref 135–145)

## 2023-09-20 MED ORDER — CHLORHEXIDINE GLUCONATE CLOTH 2 % EX PADS: 6.0000 | MEDICATED_PAD | Freq: Every day | CUTANEOUS | Status: DC

## 2023-09-20 NOTE — Progress Notes (Signed)
  NEUROSURGERY PROGRESS NOTE   Pt seen and examined. No issues overnight. Pt without complaint this am  EXAM: Temp:  [97.6 F (36.4 C)-98.8 F (37.1 C)] 98.4 F (36.9 C) (05/09 0800) Pulse Rate:  [55-71] 56 (05/09 0800) Resp:  [9-22] 17 (05/09 0800) BP: (92-119)/(44-68) 103/52 (05/09 0800) SpO2:  [93 %-100 %] 98 % (05/09 0800) Arterial Line BP: (116-150)/(52-71) 120/56 (05/09 0800) Weight:  [100.8 kg-103.1 kg] 100.8 kg (05/08 1635) Intake/Output      05/08 0701 05/09 0700 05/09 0701 05/10 0700   I.V. (mL/kg) 700 (6.9)    IV Piggyback 100    Total Intake(mL/kg) 800 (7.9)    Urine (mL/kg/hr) 1000    Blood 20    Total Output 1020    Net -220          Awake, alert, oriented Speech fluent CN intact 5/5 BUE/BLE Right groin c/d/I, soft  LABS: Lab Results  Component Value Date   CREATININE 1.22 (H) 09/20/2023   BUN 16 09/20/2023   NA 136 09/20/2023   K 3.8 09/20/2023   CL 105 09/20/2023   CO2 22 09/20/2023   Lab Results  Component Value Date   WBC 7.9 09/20/2023   HGB 9.6 (L) 09/20/2023   HCT 30.0 (L) 09/20/2023   MCV 85.0 09/20/2023   PLT 326 09/20/2023    IMPRESSION: - 68 y.o. female POD#1 s/p elective Pipeline embolization RICA aneurysm  PLAN: - d/c a-line - d/c foley - mobilize this am - d/c home   Augusto Blonder, MD [facility]

## 2023-09-20 NOTE — Discharge Summary (Signed)
 Physician Discharge Summary  Patient ID: Christy Lynch  LACOSTA AX MRN: [id number] DOB/AGE: 15-Jan-1956 68 y.o.  Admit date: 09/19/2023 Discharge date: 09/20/2023  Admission Diagnoses:  Cerebral aneurysm  Discharge Diagnoses:  Same Principal Problem:   Cerebral aneurysm without rupture   Discharged Condition: Stable  Hospital Course:  Christy  C Lynch is a 68 y.o. female who underwent elective Pipeline embolization of a RICA aneurysm. She was monitored overnight, at baseline on POD#1 and was discharged in stable condition  Treatments: Surgery - Pipeline embolization RICA aneurysm  Discharge Exam: Blood pressure (!) 103/52, pulse (!) 56, temperature 98.4 F (36.9 C), temperature source Axillary, resp. rate 17, height 5\' 7"  (1.702 m), weight 100.8 kg, SpO2 98%. Awake, alert, oriented Speech fluent, appropriate CN grossly intact 5/5 BUE/BLE Wound c/d/i  Disposition: Discharge disposition: 01-Home or Self Care       Discharge Instructions     Call MD for:  redness, tenderness, or signs of infection (pain, swelling, redness, odor or green/yellow discharge around incision site)   Complete by: As directed    Call MD for:  temperature >100.4   Complete by: As directed    Diet - low sodium heart healthy   Complete by: As directed    Discharge instructions   Complete by: As directed    Walk at home as much as possible, at least 4 times / day   Increase activity slowly   Complete by: As directed    Lifting restrictions   Complete by: As directed    No lifting > 10 lbs   May shower / Bathe   Complete by: As directed    48 hours after surgery   May walk up steps   Complete by: As directed    No wound care   Complete by: As directed    Other Restrictions   Complete by: As directed    No bending/twisting at waist      Allergies as of 09/20/2023       Reactions   Flagyl [metronidazole] Nausea And Vomiting, Other (See Comments)   headache        Medication List      TAKE these medications    aspirin  EC 81 MG tablet Take 81 mg by mouth daily. Swallow whole.   atenolol  50 MG tablet Commonly known as: TENORMIN  Take 50 mg by mouth every evening.   clopidogrel  75 MG tablet Commonly known as: PLAVIX  Take 75 mg by mouth See admin instructions. Take one tablet by mouth 7 days before surgery including the day of the surgery   DULoxetine  60 MG capsule Commonly known as: CYMBALTA  Take 60 mg by mouth every evening.   esomeprazole 40 MG capsule Commonly known as: NEXIUM Take 40 mg by mouth daily at 12 noon.   letrozole  2.5 MG tablet Commonly known as: FEMARA  Take 1 tablet (2.5 mg total) by mouth daily.   lisinopril -hydrochlorothiazide  20-25 MG tablet Commonly known as: ZESTORETIC  Take 1 tablet by mouth daily.   multivitamin with minerals Tabs tablet Take 1 tablet by mouth every evening.   oxyCODONE  5 MG immediate release tablet Commonly known as: Oxy IR/ROXICODONE  Take 1 tablet (5 mg total) by mouth every 6 (six) hours as needed for severe pain (pain score 7-10).   topiramate  50 MG tablet Commonly known as: TOPAMAX  Take 50 mg by mouth daily.   VITAMIN D PO Take 1 tablet by mouth daily.        Follow-up Information     Augusto Blonder, MD  Follow up.   Specialty: Neurosurgery Contact information: [address] [name] [address] [phone]                 Signed: Deatra Face 09/20/2023, 8:39 AM

## 2023-09-23 ENCOUNTER — Other Ambulatory Visit: Payer: Self-pay | Admitting: Internal Medicine

## 2023-10-03 DIAGNOSIS — I671 Cerebral aneurysm, nonruptured: Secondary | ICD-10-CM | POA: Diagnosis not present

## 2023-10-08 DIAGNOSIS — I700 Atherosclerosis of aorta: Secondary | ICD-10-CM | POA: Diagnosis not present

## 2023-10-08 DIAGNOSIS — R42 Dizziness and giddiness: Secondary | ICD-10-CM | POA: Diagnosis not present

## 2023-10-14 DIAGNOSIS — Z79899 Other long term (current) drug therapy: Secondary | ICD-10-CM | POA: Diagnosis not present

## 2023-10-14 DIAGNOSIS — Z299 Encounter for prophylactic measures, unspecified: Secondary | ICD-10-CM | POA: Diagnosis not present

## 2023-10-14 DIAGNOSIS — R0781 Pleurodynia: Secondary | ICD-10-CM | POA: Diagnosis not present

## 2023-10-14 DIAGNOSIS — N1832 Chronic kidney disease, stage 3b: Secondary | ICD-10-CM | POA: Diagnosis not present

## 2023-10-14 DIAGNOSIS — R0789 Other chest pain: Secondary | ICD-10-CM | POA: Diagnosis not present

## 2023-10-14 DIAGNOSIS — E876 Hypokalemia: Secondary | ICD-10-CM | POA: Diagnosis not present

## 2023-10-14 DIAGNOSIS — I10 Essential (primary) hypertension: Secondary | ICD-10-CM | POA: Diagnosis not present

## 2023-10-15 ENCOUNTER — Inpatient Hospital Stay: Attending: Hematology and Oncology | Admitting: Hematology and Oncology

## 2023-10-15 VITALS — BP 98/60 | HR 64 | Temp 97.3°F | Resp 18 | Ht 67.0 in | Wt 219.0 lb

## 2023-10-15 DIAGNOSIS — N189 Chronic kidney disease, unspecified: Secondary | ICD-10-CM | POA: Insufficient documentation

## 2023-10-15 DIAGNOSIS — Z170 Estrogen receptor positive status [ER+]: Secondary | ICD-10-CM | POA: Diagnosis not present

## 2023-10-15 DIAGNOSIS — R519 Headache, unspecified: Secondary | ICD-10-CM | POA: Insufficient documentation

## 2023-10-15 DIAGNOSIS — E876 Hypokalemia: Secondary | ICD-10-CM | POA: Insufficient documentation

## 2023-10-15 DIAGNOSIS — Z1721 Progesterone receptor positive status: Secondary | ICD-10-CM | POA: Diagnosis not present

## 2023-10-15 DIAGNOSIS — Z79811 Long term (current) use of aromatase inhibitors: Secondary | ICD-10-CM | POA: Diagnosis not present

## 2023-10-15 DIAGNOSIS — N898 Other specified noninflammatory disorders of vagina: Secondary | ICD-10-CM | POA: Insufficient documentation

## 2023-10-15 DIAGNOSIS — Z79899 Other long term (current) drug therapy: Secondary | ICD-10-CM | POA: Diagnosis not present

## 2023-10-15 DIAGNOSIS — Z1731 Human epidermal growth factor receptor 2 positive status: Secondary | ICD-10-CM | POA: Diagnosis not present

## 2023-10-15 DIAGNOSIS — C50412 Malignant neoplasm of upper-outer quadrant of left female breast: Secondary | ICD-10-CM | POA: Insufficient documentation

## 2023-10-15 DIAGNOSIS — R0781 Pleurodynia: Secondary | ICD-10-CM | POA: Diagnosis not present

## 2023-10-15 DIAGNOSIS — Z7902 Long term (current) use of antithrombotics/antiplatelets: Secondary | ICD-10-CM | POA: Diagnosis not present

## 2023-10-15 DIAGNOSIS — K5909 Other constipation: Secondary | ICD-10-CM | POA: Insufficient documentation

## 2023-10-15 DIAGNOSIS — G629 Polyneuropathy, unspecified: Secondary | ICD-10-CM | POA: Diagnosis not present

## 2023-10-15 DIAGNOSIS — Z881 Allergy status to other antibiotic agents status: Secondary | ICD-10-CM | POA: Insufficient documentation

## 2023-10-15 DIAGNOSIS — Z5112 Encounter for antineoplastic immunotherapy: Secondary | ICD-10-CM | POA: Diagnosis not present

## 2023-10-15 DIAGNOSIS — R1013 Epigastric pain: Secondary | ICD-10-CM | POA: Diagnosis not present

## 2023-10-15 MED ORDER — METRONIDAZOLE 500 MG PO TABS: 2000.0000 mg | ORAL_TABLET | Freq: Once | ORAL | 0 refills | Status: AC

## 2023-10-15 NOTE — Progress Notes (Signed)
 Patient Care Team: Orlena Bitters, MD as PCP - General (Internal Medicine) Cameron Cea, MD as Consulting Physician (Hematology and Oncology) Lockie Rima, MD as Consulting Physician (General Surgery) Johna Myers, MD as Consulting Physician (Radiation Oncology) Auther Bo, RN as Oncology Nurse Navigator Alane Hsu, RN as Oncology Nurse Navigator  DIAGNOSIS:  Encounter Diagnosis  Name Primary?   Malignant neoplasm of upper-outer quadrant of left breast in female, estrogen receptor positive (HCC) Yes    SUMMARY OF ONCOLOGIC HISTORY: Oncology History  Malignant neoplasm of upper-outer quadrant of left breast in female, estrogen receptor positive (HCC)  04/30/2022 Initial Diagnosis   Screening mammogram to treat left breast mass measuring 0.7 cm, adjacent mass 0.5 cm, biopsy of both revealed grade 3 IDC with DCIS ER 100% PR 100% Ki-67 60%, HER2 3+ positive   05/25/2022 Cancer Staging   Staging form: Breast, AJCC 8th Edition - Clinical: Stage IA (cT1b, cN0, cM0, G3, ER+, PR+, HER2+) - Signed by Cameron Cea, MD on 05/25/2022 Stage prefix: Initial diagnosis Histologic grading system: 3 grade system   07/19/2022 Surgery   Right lumpectomy: Grade 2 IDC, 1 cm with intermediate grade DCIS, ER 95%, PR 90%,, Ki-67 5%, HER2 -, 0/2 lymph nodes negative, anterior margin positive Left lumpectomy: Grade 3 IDC 1.1 cm with DCIS, margins negative, 0/2 lymph nodes negative, ER 100%, PR 100%, HER2 positive 3+, Ki-67 60%   07/19/2022 Cancer Staging   Staging form: Breast, AJCC 8th Edition - Pathologic stage from 07/19/2022: Stage IA (pT1c, pN0, cM0, G3, ER+, PR+, HER2+) - Signed by Percival Brace, NP on 07/15/2023 Histologic grading system: 3 grade system   07/29/2022 Genetic Testing   Negative genetic testing on the CancerNext-Expanded+RNAinsight panel.  The report date is July 29, 2022.  The CancerNext-Expanded gene panel offered by [facility] and includes sequencing and  rearrangement analysis for the following 77 genes: AIP, ALK, APC*, ATM*, AXIN2, BAP1, BARD1, BMPR1A, BRCA1*, BRCA2*, BRIP1*, CDC73, CDH1*, CDK4, CDKN1B, CDKN2A, CHEK2*, CTNNA1, DICER1, FH, FLCN, KIF1B, LZTR1, MAX, MEN1, MET, MLH1*, MSH2*, MSH3, MSH6*, MUTYH*, NF1*, NF2, NTHL1, PALB2*, PHOX2B, PMS2*, POT1, PRKAR1A, PTCH1, PTEN*, RAD51C*, RAD51D*, RB1, RET, SDHA, SDHAF2, SDHB, SDHC, SDHD, SMAD4, SMARCA4, SMARCB1, SMARCE1, STK11, SUFU, TMEM127, TP53*, TSC1, TSC2, and VHL (sequencing and deletion/duplication); EGFR, EGLN1, HOXB13, KIT, MITF, PDGFRA, POLD1, and POLE (sequencing only); EPCAM and GREM1 (deletion/duplication only). DNA and RNA analyses performed for * genes.    08/14/2022 - 07/16/2023 Chemotherapy   Patient is on Treatment Plan : BREAST Paclitaxel  + Trastuzumab  q7d / Trastuzumab  q21d     12/18/2022 - 01/16/2023 Radiation Therapy   First Treatment Date: 2022-12-18 - Last Treatment Date: 2023-01-16   Plan Name: Breast_L Site: Breast, Left Technique: 3D Mode: Photon Dose Per Fraction: 2.66 Gy Prescribed Dose (Delivered / Prescribed): 42.56 Gy / 42.56 Gy Prescribed Fxs (Delivered / Prescribed): 16 / 16   Plan Name: Breast_L_Bst Site: Breast, Left Technique: 3D Mode: Photon Dose Per Fraction: 2 Gy Prescribed Dose (Delivered / Prescribed): 8 Gy / 8 Gy Prescribed Fxs (Delivered / Prescribed): 4 / 4   Plan Name: Breast_R Site: Breast, Right Technique: 3D Mode: Photon Dose Per Fraction: 2.66 Gy Prescribed Dose (Delivered / Prescribed): 42.56 Gy / 42.56 Gy Prescribed Fxs (Delivered / Prescribed): 16 / 16   Plan Name: Breast_R_Bst Site: Breast, Right Technique: 3D Mode: Photon Dose Per Fraction: 2 Gy Prescribed Dose (Delivered / Prescribed): 8 Gy / 8 Gy Prescribed Fxs (Delivered / Prescribed): 4 / 4   02/2023 -  Anti-estrogen oral therapy   Anastrozole  daily x 7 years   Malignant neoplasm of upper-outer quadrant of right breast in female, estrogen receptor positive (HCC)  07/19/2022  Cancer Staging   Staging form: Breast, AJCC 8th Edition - Pathologic stage from 07/19/2022: Stage IA (pT1b, pN0, cM0, G2, ER+, PR+, HER2-) - Signed by Percival Brace, NP on 07/15/2023 Stage prefix: Initial diagnosis Histologic grading system: 3 grade system   07/15/2023 Initial Diagnosis   Malignant neoplasm of upper-outer quadrant of right breast in female, estrogen receptor positive (HCC)     CHIEF COMPLIANT: Epigastric pain that radiates to the back, vaginal discharge, constipation  HISTORY OF PRESENT ILLNESS:   History of Present Illness Christy Lynch is a 68 year old female who presents with constipation and vaginal discharge.  She experiences severe constipation, requiring medication for relief, which significantly impacts her daily life. She also has a persistent, malodorous vaginal discharge, initially thought to be related to hormone therapy, but it remains unresolved. She has not consulted a gynecologist recently.  A recent D-dimer test returned positive with a value of 6800. She experiences pain radiating from her ribs to her back but has no leg swelling or dyspnea. Two chest x-rays were clear, and further testing is scheduled today.  Her creatinine levels have increased from 1.38 in 2022 to 1.73 currently. She consumes approximately four liters of fluid daily, including water and vitamin-infused drinks. Despite this, her potassium levels are low, and she has been advised to start a potassium supplement.     ALLERGIES:  is allergic to flagyl [metronidazole].  MEDICATIONS:  Current Outpatient Medications  Medication Sig Dispense Refill   aspirin  EC 81 MG tablet Take 81 mg by mouth daily. Swallow whole.     atenolol  (TENORMIN ) 50 MG tablet Take 50 mg by mouth every evening.     clopidogrel  (PLAVIX ) 75 MG tablet Take 75 mg by mouth See admin instructions. Take one tablet by mouth 7 days before surgery including the day of the surgery     DULoxetine  (CYMBALTA ) 60  MG capsule Take 60 mg by mouth every evening.     esomeprazole (NEXIUM) 40 MG capsule Take 40 mg by mouth daily at 12 noon.     HYDROcodone -acetaminophen  (NORCO/VICODIN) 5-325 MG tablet Take 1 tablet by mouth every 6 (six) hours as needed.     letrozole  (FEMARA ) 2.5 MG tablet Take 1 tablet (2.5 mg total) by mouth daily. 90 tablet 3   lisinopril -hydrochlorothiazide  (ZESTORETIC ) 20-25 MG tablet Take 1 tablet by mouth daily.     methocarbamol (ROBAXIN) 500 MG tablet Take 500 mg by mouth 2 (two) times daily.     metroNIDAZOLE (FLAGYL) 500 MG tablet Take 4 tablets (2,000 mg total) by mouth once for 1 dose. 4 tablet 0   Multiple Vitamin (MULTIVITAMIN WITH MINERALS) TABS tablet Take 1 tablet by mouth every evening.     potassium chloride  (KLOR-CON ) 10 MEQ tablet Take 10 mEq by mouth 2 (two) times daily.     rosuvastatin (CRESTOR) 5 MG tablet Take 5 mg by mouth once a week.     topiramate  (TOPAMAX ) 50 MG tablet Take 1 tablet by mouth once daily 30 tablet 0   VITAMIN D PO Take 1 tablet by mouth daily.     oxyCODONE  (OXY IR/ROXICODONE ) 5 MG immediate release tablet Take 1 tablet (5 mg total) by mouth every 6 (six) hours as needed for severe pain (pain score 7-10). (Patient not taking: Reported on 09/13/2023) 5 tablet 0  No current facility-administered medications for this visit.    PHYSICAL EXAMINATION: ECOG PERFORMANCE STATUS: 1 - Symptomatic but completely ambulatory  Vitals:   10/15/23 0918  BP: 98/60  Pulse: 64  Resp: 18  Temp: (!) 97.3 F (36.3 C)  SpO2: (!) 10%   Filed Weights   10/15/23 0918  Weight: 219 lb (99.3 kg)      LABORATORY DATA:  I have reviewed the data as listed    Latest Ref Rng & Units 09/20/2023    6:08 AM 09/13/2023    2:05 PM 08/20/2023    9:17 AM  CMP  Glucose 70 - 99 mg/dL 191  95  478   BUN 8 - 23 mg/dL 16  16  14    Creatinine 0.44 - 1.00 mg/dL 2.95  6.21  3.08   Sodium 135 - 145 mmol/L 136  139  137   Potassium 3.5 - 5.1 mmol/L 3.8  3.9  3.7   Chloride 98 -  111 mmol/L 105  103  100   CO2 22 - 32 mmol/L 22  26  24    Calcium 8.9 - 10.3 mg/dL 9.2  9.9  9.9     Lab Results  Component Value Date   WBC 7.9 09/20/2023   HGB 9.6 (L) 09/20/2023   HCT 30.0 (L) 09/20/2023   MCV 85.0 09/20/2023   PLT 326 09/20/2023   NEUTROABS 3.0 09/13/2023    ASSESSMENT & PLAN:  Malignant neoplasm of upper-outer quadrant of left breast in female, 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  ------------------------------------------------------------------------------------------------------------------------------------------------ Treatment plan: 07/19/2022:Right lumpectomy: Grade 2 IDC, 1 cm with intermediate grade DCIS, ER 95%, PR 90%,, Ki-67 5%, HER2 -, 0/2 lymph nodes negative, anterior margin positive Left lumpectomy: Grade 3 IDC 1.1 cm with DCIS, margins negative, 0/2 lymph nodes negative, ER 100%, PR 100%, HER2 positive 3+, Ki-67 60% Adjuvant chemotherapy with Taxol  Herceptin  completed 10/29/2022 followed by Herceptin  maintenance completed 07/16/2023 Adjuvant radiation 12/19/2022-01/16/2023 Adjuvant antiestrogen therapy started 07/15/2023 ------------------------------------------------------------------------  Current treatment: Letrozole  started 07/15/2023   Anastrozole  toxicities: Tolerating extremely well without any side effects.  Denies any hot flashes or arthralgias or myalgias. 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  Was seen by Dr. Mark Sil and referred for vascular for intervention.  Status post embolization of RICA aneurysm by Dr. Nat Badger on 09/19/2023   Mild peripheral neuropathy: Monitoring closely.  Mostly tips of the fingers and toes.   Breast cancer surveillance: Mammogram 08/13/2023:  Benign (6 cm seroma), density category C Breast MRI has been ordered for September  Epigastric discomfort radiating to the back with elevated D-dimer: Her primary care physician is working her up for a PE.  Because of her kidney issues she probably cannot do contrast CT scan but will benefit from a VQ scan.  She is talking to her PCP today to discuss this further.  Renal insufficiency: I discussed with her that part of it could be coming from Zestoretic  and she will discuss with her PCP about discontinuing it.  She has a 1 month to see Loris Ros for follow-up.  ------------------------------------- Assessment and Plan Assessment & Plan Elevated D-dimer D-dimer elevated at 6800, indicating potential thromboembolism. No leg swelling or significant respiratory symptoms. Chest x-ray clear; CT  scan not feasible due to renal impairment. - Order VQ scan to assess for pulmonary embolism. - Consult with primary care physician regarding further testing and management.  Chronic kidney disease, unspecified Chronic kidney disease with worsening renal function. Creatinine increased from 1.38 in 2022 to 1.73, likely due to Zestoretic -induced diuresis. Hypokalemia noted, likely medication-related. - Consider discontinuing Zestoretic  and recheck renal function in 2-3 weeks. - Increase fluid intake. - Discuss medication change with primary care physician. - Consider potassium supplementation as advised by primary care physician.  Vaginal discharge, unspecified Vaginal discharge possibly due to mild infection or hormone therapy. Previous nausea with Flagyl noted, not an allergy. - Prescribe Flagyl, 4 tablets to be taken at once, or 2 tablets followed by another 2 tablets after 30 minutes if concerned about nausea.  Constipation Chronic constipation requiring pharmacological intervention. Current focus on thromboembolism and renal function. - Increase fluid intake. - Use over-the-counter Colace (50 mg) once  or twice daily as needed.  Follow-up Follow-up plans established for ongoing management and monitoring. - Contact primary care physician to confirm scheduled tests and discuss discontinuation of Zestoretic . - Follow up with primary care physician regarding renal function and potential medication changes. - MRI of breast scheduled for September and follow-up in December.      No orders of the defined types were placed in this encounter.  The patient has a good understanding of the overall plan. she agrees with it. she will call with any problems that may develop before the next visit here. Total time spent: 30 mins including face to face time and time spent for planning, charting and co-ordination of care   Christy K Salma Walrond, MD 10/15/23

## 2023-10-15 NOTE — Assessment & Plan Note (Signed)
 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  ------------------------------------------------------------------------------------------------------------------------------------------------ Treatment plan: 07/19/2022:Right lumpectomy: Grade 2 IDC, 1 cm with intermediate grade DCIS, ER 95%, PR 90%,, Ki-67 5%, HER2 -, 0/2 lymph nodes negative, anterior margin positive Left lumpectomy: Grade 3 IDC 1.1 cm with DCIS, margins negative, 0/2 lymph nodes negative, ER 100%, PR 100%, HER2 positive 3+, Ki-67 60% Adjuvant chemotherapy with Taxol  Herceptin  completed 10/29/2022 followed by Herceptin  maintenance completed 07/16/2023 Adjuvant radiation 12/19/2022-01/16/2023 Adjuvant antiestrogen therapy started 07/15/2023 ------------------------------------------------------------------------  Current treatment: Letrozole  started 07/15/2023   Anastrozole  toxicities: Tolerating extremely well without any side effects.  Denies any hot flashes or arthralgias or myalgias. 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  Was seen by Dr. Mark Sil and referred for vascular for intervention.  Status post embolization of RICA aneurysm by Dr. Nat Badger on 09/19/2023   Mild peripheral neuropathy: Monitoring closely.  Mostly tips of the fingers and toes.   Breast cancer surveillance: Mammogram 08/13/2023: Benign (6 cm seroma), density category C Breast MRI has been ordered for September  Return to clinic in 1 year for follow-up

## 2023-10-21 ENCOUNTER — Other Ambulatory Visit: Payer: Self-pay | Admitting: Internal Medicine

## 2023-10-30 DIAGNOSIS — R079 Chest pain, unspecified: Secondary | ICD-10-CM | POA: Diagnosis not present

## 2023-11-13 ENCOUNTER — Other Ambulatory Visit: Payer: Self-pay | Admitting: Student

## 2023-12-03 ENCOUNTER — Other Ambulatory Visit: Payer: Self-pay | Admitting: Internal Medicine

## 2023-12-09 DIAGNOSIS — E7800 Pure hypercholesterolemia, unspecified: Secondary | ICD-10-CM | POA: Diagnosis not present

## 2023-12-09 DIAGNOSIS — M869 Osteomyelitis, unspecified: Secondary | ICD-10-CM | POA: Diagnosis not present

## 2023-12-09 DIAGNOSIS — K5909 Other constipation: Secondary | ICD-10-CM | POA: Diagnosis not present

## 2023-12-09 DIAGNOSIS — R42 Dizziness and giddiness: Secondary | ICD-10-CM | POA: Diagnosis not present

## 2023-12-09 DIAGNOSIS — I10 Essential (primary) hypertension: Secondary | ICD-10-CM | POA: Diagnosis not present

## 2023-12-09 DIAGNOSIS — Z0000 Encounter for general adult medical examination without abnormal findings: Secondary | ICD-10-CM | POA: Diagnosis not present

## 2023-12-09 DIAGNOSIS — Z87891 Personal history of nicotine dependence: Secondary | ICD-10-CM | POA: Diagnosis not present

## 2023-12-09 DIAGNOSIS — Z299 Encounter for prophylactic measures, unspecified: Secondary | ICD-10-CM | POA: Diagnosis not present

## 2023-12-10 ENCOUNTER — Encounter: Payer: Self-pay | Admitting: Adult Health

## 2023-12-10 DIAGNOSIS — Z79899 Other long term (current) drug therapy: Secondary | ICD-10-CM | POA: Diagnosis not present

## 2023-12-10 DIAGNOSIS — E7800 Pure hypercholesterolemia, unspecified: Secondary | ICD-10-CM | POA: Diagnosis not present

## 2023-12-10 DIAGNOSIS — R5383 Other fatigue: Secondary | ICD-10-CM | POA: Diagnosis not present

## 2024-01-01 ENCOUNTER — Other Ambulatory Visit: Payer: Self-pay | Admitting: Internal Medicine

## 2024-01-15 ENCOUNTER — Ambulatory Visit
Admission: RE | Admit: 2024-01-15 | Discharge: 2024-01-15 | Disposition: A | Discharge status: Home / Self Care | Source: Ambulatory Visit | Attending: Adult Health | Admitting: Adult Health

## 2024-01-15 ENCOUNTER — Ambulatory Visit: Payer: Self-pay | Admitting: *Deleted

## 2024-01-15 DIAGNOSIS — Z170 Estrogen receptor positive status [ER+]: Secondary | ICD-10-CM

## 2024-01-15 DIAGNOSIS — Z1239 Encounter for other screening for malignant neoplasm of breast: Secondary | ICD-10-CM | POA: Diagnosis not present

## 2024-01-15 DIAGNOSIS — Z9189 Other specified personal risk factors, not elsewhere classified: Secondary | ICD-10-CM

## 2024-01-15 DIAGNOSIS — C50411 Malignant neoplasm of upper-outer quadrant of right female breast: Secondary | ICD-10-CM

## 2024-01-15 DIAGNOSIS — Z803 Family history of malignant neoplasm of breast: Secondary | ICD-10-CM | POA: Diagnosis not present

## 2024-01-15 DIAGNOSIS — Z853 Personal history of malignant neoplasm of breast: Secondary | ICD-10-CM | POA: Diagnosis not present

## 2024-01-15 MED ORDER — GADOPICLENOL 0.5 MMOL/ML IV SOLN
10.0000 mL | Freq: Once | INTRAVENOUS | Status: AC | PRN
  Administered 2024-01-15: 10 mL via INTRAVENOUS

## 2024-01-30 ENCOUNTER — Other Ambulatory Visit: Payer: Self-pay | Admitting: Internal Medicine

## 2024-02-11 DIAGNOSIS — D649 Anemia, unspecified: Secondary | ICD-10-CM | POA: Diagnosis not present

## 2024-02-11 DIAGNOSIS — I10 Essential (primary) hypertension: Secondary | ICD-10-CM | POA: Diagnosis not present

## 2024-02-11 DIAGNOSIS — Z299 Encounter for prophylactic measures, unspecified: Secondary | ICD-10-CM | POA: Diagnosis not present

## 2024-02-11 DIAGNOSIS — K5909 Other constipation: Secondary | ICD-10-CM | POA: Diagnosis not present

## 2024-02-27 ENCOUNTER — Other Ambulatory Visit: Payer: Self-pay | Admitting: Internal Medicine

## 2024-02-28 ENCOUNTER — Other Ambulatory Visit: Payer: Self-pay | Admitting: Neurosurgery

## 2024-02-28 DIAGNOSIS — I671 Cerebral aneurysm, nonruptured: Secondary | ICD-10-CM

## 2024-03-24 ENCOUNTER — Other Ambulatory Visit: Payer: Self-pay | Admitting: Neurosurgery

## 2024-03-24 ENCOUNTER — Other Ambulatory Visit: Payer: Self-pay

## 2024-03-24 ENCOUNTER — Ambulatory Visit (HOSPITAL_COMMUNITY)
Admission: RE | Admit: 2024-03-24 | Discharge: 2024-03-24 | Disposition: A | Discharge status: Home / Self Care | Source: Ambulatory Visit | Attending: Neurosurgery | Admitting: Neurosurgery

## 2024-03-24 DIAGNOSIS — I671 Cerebral aneurysm, nonruptured: Secondary | ICD-10-CM | POA: Insufficient documentation

## 2024-03-24 DIAGNOSIS — Z7982 Long term (current) use of aspirin: Secondary | ICD-10-CM | POA: Diagnosis not present

## 2024-03-24 DIAGNOSIS — Z7902 Long term (current) use of antithrombotics/antiplatelets: Secondary | ICD-10-CM | POA: Diagnosis not present

## 2024-03-24 DIAGNOSIS — Z87891 Personal history of nicotine dependence: Secondary | ICD-10-CM | POA: Insufficient documentation

## 2024-03-24 HISTORY — PX: IR US GUIDE VASC ACCESS RIGHT: IMG2390

## 2024-03-24 HISTORY — PX: IR ANGIO INTRA EXTRACRAN SEL INTERNAL CAROTID UNI R MOD SED: IMG5362

## 2024-03-24 LAB — PROTIME-INR
INR: 1 (ref 0.8–1.2)
Prothrombin Time: 13.6 s (ref 11.4–15.2)

## 2024-03-24 LAB — CBC WITH DIFFERENTIAL/PLATELET
Abs Immature Granulocytes: 0.12 K/uL — ABNORMAL HIGH (ref 0.00–0.07)
Basophils Absolute: 0 K/uL (ref 0.0–0.1)
Basophils Relative: 0 %
Eosinophils Absolute: 0.1 K/uL (ref 0.0–0.5)
Eosinophils Relative: 1 %
HCT: 33.9 % — ABNORMAL LOW (ref 36.0–46.0)
Hemoglobin: 10.8 g/dL — ABNORMAL LOW (ref 12.0–15.0)
Immature Granulocytes: 1 %
Lymphocytes Relative: 26 %
Lymphs Abs: 2.5 K/uL (ref 0.7–4.0)
MCH: 28.2 pg (ref 26.0–34.0)
MCHC: 31.9 g/dL (ref 30.0–36.0)
MCV: 88.5 fL (ref 80.0–100.0)
Monocytes Absolute: 0.8 K/uL (ref 0.1–1.0)
Monocytes Relative: 9 %
Neutro Abs: 5.9 K/uL (ref 1.7–7.7)
Neutrophils Relative %: 63 %
Platelets: 330 K/uL (ref 150–400)
RBC: 3.83 MIL/uL — ABNORMAL LOW (ref 3.87–5.11)
RDW: 14.2 % (ref 11.5–15.5)
WBC: 9.5 K/uL (ref 4.0–10.5)
nRBC: 0 % (ref 0.0–0.2)

## 2024-03-24 LAB — BASIC METABOLIC PANEL WITH GFR
Anion gap: 11 (ref 5–15)
BUN: 26 mg/dL — ABNORMAL HIGH (ref 8–23)
CO2: 26 mmol/L (ref 22–32)
Calcium: 9.2 mg/dL (ref 8.9–10.3)
Chloride: 103 mmol/L (ref 98–111)
Creatinine, Ser: 1.24 mg/dL — ABNORMAL HIGH (ref 0.44–1.00)
GFR, Estimated: 47 mL/min — ABNORMAL LOW (ref >60)
Glucose, Bld: 96 mg/dL (ref 70–99)
Potassium: 4.3 mmol/L (ref 3.5–5.1)
Sodium: 140 mmol/L (ref 135–145)

## 2024-03-24 LAB — APTT: aPTT: 27 s (ref 24–36)

## 2024-03-24 MED ORDER — CHLORHEXIDINE GLUCONATE CLOTH 2 % EX PADS: 6.0000 | MEDICATED_PAD | Freq: Once | CUTANEOUS | Status: DC

## 2024-03-24 MED ORDER — LIDOCAINE HCL 1 % IJ SOLN
INTRAMUSCULAR | Status: AC
  Filled 2024-03-24: qty 20

## 2024-03-24 MED ORDER — MIDAZOLAM HCL (PF) 2 MG/2ML IJ SOLN
INTRAMUSCULAR | Status: AC | PRN
Start: 2024-03-24 — End: 2024-03-24
  Administered 2024-03-24: .5 mg via INTRAVENOUS

## 2024-03-24 MED ORDER — IOHEXOL 300 MG/ML  SOLN
100.0000 mL | Freq: Once | INTRAMUSCULAR | Status: AC | PRN
  Administered 2024-03-24: 25 mL via INTRA_ARTERIAL

## 2024-03-24 MED ORDER — FENTANYL CITRATE (PF) 100 MCG/2ML IJ SOLN
INTRAMUSCULAR | Status: AC
  Filled 2024-03-24: qty 2

## 2024-03-24 MED ORDER — CEFAZOLIN SODIUM-DEXTROSE 2-4 GM/100ML-% IV SOLN: 2.0000 g | INTRAVENOUS | Status: DC

## 2024-03-24 MED ORDER — HEPARIN SODIUM (PORCINE) 1000 UNIT/ML IJ SOLN
INTRAMUSCULAR | Status: AC
  Filled 2024-03-24: qty 10

## 2024-03-24 MED ORDER — MIDAZOLAM HCL 2 MG/2ML IJ SOLN
INTRAMUSCULAR | Status: AC
  Filled 2024-03-24: qty 2

## 2024-03-24 MED ORDER — NITROGLYCERIN 1 MG/10 ML FOR IR/CATH LAB
INTRA_ARTERIAL | Status: AC
  Filled 2024-03-24: qty 10

## 2024-03-24 MED ORDER — HEPARIN SODIUM (PORCINE) 1000 UNIT/ML IJ SOLN
INTRAMUSCULAR | Status: AC | PRN
Start: 2024-03-24 — End: 2024-03-24
  Administered 2024-03-24: 3000 [IU] via INTRAVENOUS

## 2024-03-24 MED ORDER — VERAPAMIL HCL 2.5 MG/ML IV SOLN: INTRA_ARTERIAL | Status: AC | PRN

## 2024-03-24 MED ORDER — FENTANYL CITRATE (PF) 100 MCG/2ML IJ SOLN
INTRAMUSCULAR | Status: AC | PRN
  Administered 2024-03-24: 25 ug via INTRAVENOUS

## 2024-03-24 MED ORDER — VERAPAMIL HCL 2.5 MG/ML IV SOLN
INTRAVENOUS | Status: AC
  Filled 2024-03-24: qty 2

## 2024-03-24 NOTE — Discharge Instructions (Signed)

## 2024-03-24 NOTE — H&P (Signed)
 Chief Complaint   Aneurysm  History of Present Illness  Christy Lynch is a 68 y.o. 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 She has done very well after the procedure and remains on ASA/Brilinta.  Past Medical History   Past Medical History:  Diagnosis Date   Anemia    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   Bursitis of hip    Esophageal reflux    Family history of breast cancer    Family history of colon cancer    Family history of kidney cancer    Fibromyalgia    Headache    Hypertension    Other and unspecified hyperlipidemia     Past Surgical History   Past Surgical History:  Procedure Laterality Date   ABDOMINAL HYSTERECTOMY     BREAST BIOPSY Left    neg bx early 2000's   BREAST BIOPSY Left 04/30/2022   US  LT BREAST BX W LOC DEV 1ST LESION IMG BX SPEC US  GUIDE 04/30/2022 [facility]   BREAST BIOPSY Left 04/30/2022   US  LT BREAST BX W LOC DEV EA ADD LESION IMG BX SPEC US  GUIDE 04/30/2022 [facility]   BREAST BIOPSY  07/17/2022   MM LT RADIOACTIVE SEED LOC MAMMO GUIDE 07/17/2022 [facility]   BREAST BIOPSY  07/17/2022   MM LT RADIOACTIVE SEED EA ADD LESION LOC MAMMO GUIDE 07/17/2022 [facility]   BREAST BIOPSY  07/17/2022   MM RT RADIOACTIVE SEED LOC MAMMO GUIDE 07/17/2022 [facility]   BREAST LUMPECTOMY WITH RADIOACTIVE SEED AND SENTINEL LYMPH NODE BIOPSY Bilateral 07/19/2022   Procedure: BILATERAL BREAST LUMPECTOMY WITH RADIOACTIVE SEED AND BILATERAL SENTINEL LYMPH NODE BIOPSY;  Surgeon: Aron Shoulders, MD;  Location: [facility];  Service: General;  Laterality: Bilateral;   IR ANGIO INTRA EXTRACRAN SEL COM CAROTID INNOMINATE UNI L MOD SED  08/20/2023   IR ANGIO INTRA EXTRACRAN SEL INTERNAL CAROTID UNI R MOD SED  08/20/2023   IR ANGIO INTRA EXTRACRAN SEL INTERNAL CAROTID UNI R MOD SED  09/19/2023   IR ANGIO VERTEBRAL SEL VERTEBRAL UNI L MOD SED  08/20/2023   IR  ANGIOGRAM FOLLOW UP STUDY  09/19/2023   IR TRANSCATH/EMBOLIZ  09/19/2023   LEFT HEART CATH  12/12/2006   PORT-A-CATH REMOVAL N/A 08/06/2023   Procedure: REMOVAL PORT-A-CATH;  Surgeon: Aron Shoulders, MD;  Location: [facility];  Service: General;  Laterality: N/A;   PORTACATH PLACEMENT Left 07/19/2022   Procedure: INSERTION PORT-A-CATH;  Surgeon: Aron Shoulders, MD;  Location: [facility];  Service: General;  Laterality: Left;   RADIOLOGY WITH ANESTHESIA N/A 09/19/2023   Procedure: RADIOLOGY WITH ANESTHESIA;  Surgeon: Lanis Pupa, MD;  Location: [facility] OR;  Service: Radiology;  Laterality: N/A;  Pipeline Embolization of right ICA aneurysm    Social History   Social History   Tobacco Use   Smoking status: Former   Smokeless tobacco: Never  Vaping Use   Vaping status: Never Used  Substance Use Topics   Alcohol use: No   Drug use: No    Medications   Prior to Admission medications   Medication Sig Start Date End Date Taking? Authorizing Provider  aspirin  EC 81 MG tablet Take 81 mg by mouth daily. Swallow whole.   Yes [provider]  atenolol  (TENORMIN ) 50 MG tablet Take 50 mg by mouth every evening.   Yes [provider]  clopidogrel  (PLAVIX ) 75 MG tablet Take 75  mg by mouth See admin instructions. Take one tablet by mouth 7 days before surgery including the day of the surgery   Yes [provider]  DULoxetine  (CYMBALTA ) 60 MG capsule Take 60 mg by mouth every evening.   Yes [provider]  esomeprazole (NEXIUM) 40 MG capsule Take 40 mg by mouth daily at 12 noon.   Yes [provider]  letrozole  (FEMARA ) 2.5 MG tablet Take 1 tablet (2.5 mg total) by mouth daily. 07/15/23  Yes Causey, Lindsey Cornetto, NP  lisinopril -hydrochlorothiazide  (ZESTORETIC ) 20-25 MG tablet Take 1 tablet by mouth daily. 04/06/22  Yes [provider]  methocarbamol (ROBAXIN) 500 MG tablet Take 500 mg by mouth 2 (two) times daily. 10/14/23   Yes [provider]  Multiple Vitamin (MULTIVITAMIN WITH MINERALS) TABS tablet Take 1 tablet by mouth every evening.   Yes [provider]  potassium chloride  (KLOR-CON ) 10 MEQ tablet Take 10 mEq by mouth 2 (two) times daily. 10/14/23  Yes [provider]  rosuvastatin (CRESTOR) 5 MG tablet Take 5 mg by mouth once a week. 09/18/23  Yes [provider]  topiramate  (TOPAMAX ) 50 MG tablet Take 1 tablet by mouth once daily 02/27/24  Yes Vaslow, Zachary K, MD  VITAMIN D PO Take 1 tablet by mouth daily.   Yes [provider]  HYDROcodone -acetaminophen  (NORCO/VICODIN) 5-325 MG tablet Take 1 tablet by mouth every 6 (six) hours as needed. 10/14/23   [provider]  oxyCODONE  (OXY IR/ROXICODONE ) 5 MG immediate release tablet Take 1 tablet (5 mg total) by mouth every 6 (six) hours as needed for severe pain (pain score 7-10). Patient not taking: Reported on 09/13/2023 08/06/23   Aron Shoulders, MD    Allergies   Allergies  Allergen Reactions   Flagyl  [Metronidazole ] Nausea And Vomiting and Other (See Comments)    headache    Review of Systems  ROS  Neurologic Exam  Awake, alert, oriented Memory and concentration grossly intact Speech fluent, appropriate CN grossly intact Motor exam: Upper Extremities Deltoid Bicep Tricep Grip  Right 5/5 5/5 5/5 5/5  Left 5/5 5/5 5/5 5/5   Lower Extremities IP Quad PF DF EHL  Right 5/5 5/5 5/5 5/5 5/5  Left 5/5 5/5 5/5 5/5 5/5   Sensation grossly intact to LT  Impression  - 68 y.o. female 6 months s/p Pipeline embolization of right ophthalmic aneurysm, doing well  Plan  - Will proceed with routine short-term angiogram  I have reviewed the indications for the procedure as well as the details of the procedure and the expected postoperative course and recovery at length with the patient in the office. We have also reviewed in detail the risks, benefits, and alternatives to the procedure. All questions were  answered and Christy Lynch provided informed consent to proceed.  Gerldine Maizes, MD [facility]

## 2024-03-24 NOTE — Progress Notes (Signed)
 Discharge instructions reviewed with patient and Darryl significant other at the bedside. Denies questions or concerns.PT tolerated PO intake without difficulty. No complaints of n/v. TR Band removed no s/s complications at the incision site. PT ambulated in the hallway. Was able to void prior to discharge. PT escorted from the unit via wheel chair to personal vehicle.

## 2024-03-27 NOTE — Op Note (Signed)
  ENDOVASCULAR NEUROSURGERY OPERATIVE NOTE   PROCEDURE: Diagnostic Cerebral Angiogram   SURGEON:   Dr. Gerldine Maizes, MD  HISTORY:   The patient is a 68 y.o. 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  Patient has done very well from a neurologic standpoint.  She comes in today for routine short-term angiographic follow-up.  APPROACH:   The technical aspects of the procedure as well as its potential risks and benefits were reviewed with the patient. These risks included but were not limited bleeding, infection, allergic reaction, damage to organs/vital structures, stroke, non-diagnostic procedure, and the catastrophic outcomes of heart attack, coma, and death. With an understanding of these risks, informed consent was obtained and witnessed.    The patient was placed in the supine position on the angiography table and the skin of right wrist and groin prepped in the usual sterile fashion. The procedure was performed under local anesthesia (1%-solution of bicarbonate-bufferred Lidoacaine) and conscious sedation with Versed  and fentanyl  monitored by the in-suite nurse and myself, including non-invasive blood pressure and continuous pulse oxymetry.    Access to the right radial artery was then obtained using ultrasound guidance in order to directly visualize the micropuncture needle within the lumen of the right radial artery.  A 5 French glide slender sheath was then placed using standard Seldinger technique.  HEPARIN :  3000 Units total.    CONTRAST AGENT:  See IR records   FLUOROSCOPY TIME:  See IR records    CATHETER(S) AND WIRE(S):    5-French Simmons-2 glidecatheter   0.035" glidewire    VESSELS CATHETERIZED:   Right internal carotid   Right Radial  VESSELS STUDIED:   Right internal carotid, head Right Radial  PROCEDURAL NARRATIVE:   A 5-Fr terumo glide catheter was advanced over a 0.035 glidewire into the aortic  arch. The right internal carotid was then catheterized and cervical/cerebral angiograms taken. After review of images, the catheter was removed without incident.    INTERPRETATION:   Right internal carotid, head:   Injection reveals the presence of a widely patent ICA, M1, and A1 segments and their branches.  Pipeline device is seen extending from the cavernous to supraclinoid internal carotid artery.  There has been complete occlusion of the previously seen ophthalmic aneurysm.  There is mild focal stenosis within the distal aspect of the anterior genu of the cavernous carotid.  There is no significant flow limitation. The parenchymal and venous phases are unremarkable. The venous sinuses are widely patent.    Right radial: Normal vessel. No significant atherosclerotic disease. Arterial sheath in adequate position.   DISPOSITION:  Upon completion of the study, the sheath was removed and hemostasis obtained using a Terumo TR band. Good proximal and distal extremity pulses were documented upon achievement of hemostasis. The procedure was well tolerated and no early complications were observed.  The patient was transferred to the recovery area for further care.    IMPRESSION:  1.  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    Gerldine Maizes, MD [facility]

## 2024-03-29 ENCOUNTER — Other Ambulatory Visit: Payer: Self-pay | Admitting: Internal Medicine

## 2024-03-30 ENCOUNTER — Encounter: Payer: Self-pay | Admitting: Hematology and Oncology

## 2024-03-30 ENCOUNTER — Encounter (HOSPITAL_COMMUNITY): Payer: Self-pay

## 2024-04-14 ENCOUNTER — Inpatient Hospital Stay: Attending: Adult Health | Admitting: Adult Health

## 2024-04-14 ENCOUNTER — Telehealth: Payer: Self-pay

## 2024-04-14 ENCOUNTER — Encounter: Payer: Self-pay | Admitting: Adult Health

## 2024-04-14 VITALS — BP 109/47 | HR 56 | Temp 97.3°F | Resp 18 | Wt 221.3 lb

## 2024-04-14 DIAGNOSIS — Z90710 Acquired absence of both cervix and uterus: Secondary | ICD-10-CM | POA: Insufficient documentation

## 2024-04-14 DIAGNOSIS — Z823 Family history of stroke: Secondary | ICD-10-CM | POA: Insufficient documentation

## 2024-04-14 DIAGNOSIS — Z9189 Other specified personal risk factors, not elsewhere classified: Secondary | ICD-10-CM

## 2024-04-14 DIAGNOSIS — Z803 Family history of malignant neoplasm of breast: Secondary | ICD-10-CM | POA: Insufficient documentation

## 2024-04-14 DIAGNOSIS — C50412 Malignant neoplasm of upper-outer quadrant of left female breast: Secondary | ICD-10-CM | POA: Insufficient documentation

## 2024-04-14 DIAGNOSIS — Z1721 Progesterone receptor positive status: Secondary | ICD-10-CM | POA: Diagnosis not present

## 2024-04-14 DIAGNOSIS — Z87891 Personal history of nicotine dependence: Secondary | ICD-10-CM | POA: Diagnosis not present

## 2024-04-14 DIAGNOSIS — Z79899 Other long term (current) drug therapy: Secondary | ICD-10-CM | POA: Insufficient documentation

## 2024-04-14 DIAGNOSIS — Z8419 Family history of other disorders of kidney and ureter: Secondary | ICD-10-CM | POA: Diagnosis not present

## 2024-04-14 DIAGNOSIS — Z1732 Human epidermal growth factor receptor 2 negative status: Secondary | ICD-10-CM | POA: Insufficient documentation

## 2024-04-14 DIAGNOSIS — Z8051 Family history of malignant neoplasm of kidney: Secondary | ICD-10-CM | POA: Insufficient documentation

## 2024-04-14 DIAGNOSIS — I10 Essential (primary) hypertension: Secondary | ICD-10-CM | POA: Insufficient documentation

## 2024-04-14 DIAGNOSIS — K5909 Other constipation: Secondary | ICD-10-CM | POA: Diagnosis not present

## 2024-04-14 DIAGNOSIS — C50411 Malignant neoplasm of upper-outer quadrant of right female breast: Secondary | ICD-10-CM | POA: Diagnosis present

## 2024-04-14 DIAGNOSIS — D649 Anemia, unspecified: Secondary | ICD-10-CM | POA: Insufficient documentation

## 2024-04-14 DIAGNOSIS — Z800 Family history of malignant neoplasm of digestive organs: Secondary | ICD-10-CM | POA: Diagnosis not present

## 2024-04-14 DIAGNOSIS — Z79811 Long term (current) use of aromatase inhibitors: Secondary | ICD-10-CM | POA: Diagnosis not present

## 2024-04-14 DIAGNOSIS — Z883 Allergy status to other anti-infective agents status: Secondary | ICD-10-CM | POA: Insufficient documentation

## 2024-04-14 DIAGNOSIS — Z170 Estrogen receptor positive status [ER+]: Secondary | ICD-10-CM | POA: Insufficient documentation

## 2024-04-14 DIAGNOSIS — K5900 Constipation, unspecified: Secondary | ICD-10-CM

## 2024-04-14 NOTE — Telephone Encounter (Signed)
 Encounter Note  Action Taken: Contacted patient's PCP regarding labs collected at last visit.  Communication: Spoke with staff at Dr. Rosamond' office; last lab visit documented as July.  Follow-up: Coordinator will fax lab results for review.  Plan: Lab work to be ordered based on prior results and Guardant kit request during today's visit.

## 2024-04-14 NOTE — Progress Notes (Unsigned)
 [facility] Cancer Follow up:    Christy Leta NOVAK, MD [address] [name] [address]   DIAGNOSIS: Cancer Staging  Malignant neoplasm of upper-outer quadrant of left breast in female, estrogen receptor positive (HCC) Staging form: Breast, AJCC 8th Edition - Clinical: Stage IA (cT1b, cN0, cM0, G3, ER+, PR+, HER2+) - Signed by Odean Potts, MD on 05/25/2022 Stage prefix: Initial diagnosis Histologic grading system: 3 grade system - Pathologic stage from 07/19/2022: Stage IA (pT1c, pN0, cM0, G3, ER+, PR+, HER2+) - Signed by Crawford Morna Pickle, NP on 07/15/2023 Histologic grading system: 3 grade system  Malignant neoplasm of upper-outer quadrant of right breast in female, estrogen receptor positive (HCC) Staging form: Breast, AJCC 8th Edition - Pathologic stage from 07/19/2022: Stage IA (pT1b, pN0, cM0, G2, ER+, PR+, HER2-) - Signed by Crawford Morna Pickle, NP on 07/15/2023 Stage prefix: Initial diagnosis Histologic grading system: 3 grade system    SUMMARY OF ONCOLOGIC HISTORY: Oncology History  Malignant neoplasm of upper-outer quadrant of left breast in female, estrogen receptor positive (HCC)  04/30/2022 Initial Diagnosis   Screening mammogram to treat left breast mass measuring 0.7 cm, adjacent mass 0.5 cm, biopsy of both revealed grade 3 IDC with DCIS ER 100% PR 100% Ki-67 60%, HER2 3+ positive   05/25/2022 Cancer Staging   Staging form: Breast, AJCC 8th Edition - Clinical: Stage IA (cT1b, cN0, cM0, G3, ER+, PR+, HER2+) - Signed by Odean Potts, MD on 05/25/2022 Stage prefix: Initial diagnosis Histologic grading system: 3 grade system   07/19/2022 Surgery   Right lumpectomy: Grade 2 IDC, 1 cm with intermediate grade DCIS, ER 95%, PR 90%,, Ki-67 5%, HER2 -, 0/2 lymph nodes negative, anterior margin positive Left lumpectomy: Grade 3 IDC 1.1 cm with DCIS, margins negative, 0/2 lymph nodes negative, ER 100%, PR 100%, HER2 positive 3+, Ki-67 60%   07/19/2022 Cancer Staging    Staging form: Breast, AJCC 8th Edition - Pathologic stage from 07/19/2022: Stage IA (pT1c, pN0, cM0, G3, ER+, PR+, HER2+) - Signed by Crawford Morna Pickle, NP on 07/15/2023 Histologic grading system: 3 grade system   07/29/2022 Genetic Testing   Negative genetic testing on the CancerNext-Expanded+RNAinsight panel.  The report date is July 29, 2022.  The CancerNext-Expanded gene panel offered by [facility] and includes sequencing and rearrangement analysis for the following 77 genes: AIP, ALK, APC*, ATM*, AXIN2, BAP1, BARD1, BMPR1A, BRCA1*, BRCA2*, BRIP1*, CDC73, CDH1*, CDK4, CDKN1B, CDKN2A, CHEK2*, CTNNA1, DICER1, FH, FLCN, KIF1B, LZTR1, MAX, MEN1, MET, MLH1*, MSH2*, MSH3, MSH6*, MUTYH*, NF1*, NF2, NTHL1, PALB2*, PHOX2B, PMS2*, POT1, PRKAR1A, PTCH1, PTEN*, RAD51C*, RAD51D*, RB1, RET, SDHA, SDHAF2, SDHB, SDHC, SDHD, SMAD4, SMARCA4, SMARCB1, SMARCE1, STK11, SUFU, TMEM127, TP53*, TSC1, TSC2, and VHL (sequencing and deletion/duplication); EGFR, EGLN1, HOXB13, KIT, MITF, PDGFRA, POLD1, and POLE (sequencing only); EPCAM and GREM1 (deletion/duplication only). DNA and RNA analyses performed for * genes.    08/14/2022 - 07/16/2023 Chemotherapy   Patient is on Treatment Plan : BREAST Paclitaxel  + Trastuzumab  q7d / Trastuzumab  q21d     12/18/2022 - 01/16/2023 Radiation Therapy   First Treatment Date: 2022-12-18 - Last Treatment Date: 2023-01-16   Plan Name: Breast_L Site: Breast, Left Technique: 3D Mode: Photon Dose Per Fraction: 2.66 Gy Prescribed Dose (Delivered / Prescribed): 42.56 Gy / 42.56 Gy Prescribed Fxs (Delivered / Prescribed): 16 / 16   Plan Name: Breast_L_Bst Site: Breast, Left Technique: 3D Mode: Photon Dose Per Fraction: 2 Gy Prescribed Dose (Delivered / Prescribed): 8 Gy / 8 Gy Prescribed Fxs (Delivered /  Prescribed): 4 / 4   Plan Name: Breast_R Site: Breast, Right Technique: 3D Mode: Photon Dose Per Fraction: 2.66 Gy Prescribed Dose (Delivered / Prescribed): 42.56 Gy / 42.56  Gy Prescribed Fxs (Delivered / Prescribed): 16 / 16   Plan Name: Breast_R_Bst Site: Breast, Right Technique: 3D Mode: Photon Dose Per Fraction: 2 Gy Prescribed Dose (Delivered / Prescribed): 8 Gy / 8 Gy Prescribed Fxs (Delivered / Prescribed): 4 / 4   02/2023 -  Anti-estrogen oral therapy   Anastrozole  daily x 7 years   Malignant neoplasm of upper-outer quadrant of right breast in female, estrogen receptor positive (HCC)  07/19/2022 Cancer Staging   Staging form: Breast, AJCC 8th Edition - Pathologic stage from 07/19/2022: Stage IA (pT1b, pN0, cM0, G2, ER+, PR+, HER2-) - Signed by Crawford Morna Pickle, NP on 07/15/2023 Stage prefix: Initial diagnosis Histologic grading system: 3 grade system   07/15/2023 Initial Diagnosis   Malignant neoplasm of upper-outer quadrant of right breast in female, estrogen receptor positive (HCC)     CURRENT THERAPY:  INTERVAL HISTORY:  Discussed the use of AI scribe software for clinical note transcription with the patient, who gave verbal consent to proceed.  History of Present Illness Christy  C Lynch is a 68 year old female with bilateral breast cancer who presents for follow-up of her breast cancer.  She takes letrozole  daily. Her bilateral mammogram on August 13, 2023 showed no evidence of malignancy with breast density category C. Bilateral breast MRI on January 15, 2024 also showed no evidence of malignancy with density category B.  She undergoes supplemental screening with MRI as her initial right breast cancer was mammographically occult.   She has chronic constipation and has tried multiple over-the-counter agents without relief, with episodes of up to two weeks without a bowel movement. Vitamin D used for anemia worsens her constipation. She has not seen gastroenterology.  Recent blood work showed low iron, and she is concerned this is causing fatigue. She is unsure if B12 has been checked.  She has not had a recent bone density test, and  one has been ordered.     Patient Active Problem List   Diagnosis Date Noted   Cerebral aneurysm without rupture 09/19/2023   Malignant neoplasm of upper-outer quadrant of right breast in female, estrogen receptor positive (HCC) 07/15/2023   Abnormal brain MRI 07/02/2023   Chemotherapy-induced peripheral neuropathy 10/15/2022   Port-A-Cath in place 08/14/2022   Genetic testing 07/30/2022   Family history of breast cancer 07/18/2022   Family history of colon cancer 07/18/2022   Family history of kidney cancer 07/18/2022   Malignant neoplasm of upper-outer quadrant of left breast in female, estrogen receptor positive (HCC) 05/22/2022   Unilateral primary osteoarthritis, left knee 10/14/2019   Vertigo 08/21/2013   Chest pain 07/02/2013   Essential hypertension 07/02/2013   Hyperlipidemia 07/02/2013    is allergic to flagyl  [metronidazole ].  MEDICAL HISTORY: Past Medical History:  Diagnosis Date   Anemia    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   Bursitis of hip    Esophageal reflux    Family history of breast cancer    Family history of colon cancer    Family history of kidney cancer    Fibromyalgia    Headache    Hypertension    Other and unspecified hyperlipidemia     SURGICAL HISTORY: Past Surgical History:  Procedure Laterality Date   ABDOMINAL HYSTERECTOMY     BREAST BIOPSY Left  neg bx early 2000's   BREAST BIOPSY Left 04/30/2022   US  LT BREAST BX W LOC DEV 1ST LESION IMG BX SPEC US  GUIDE 04/30/2022 [facility]   BREAST BIOPSY Left 04/30/2022   US  LT BREAST BX W LOC DEV EA ADD LESION IMG BX SPEC US  GUIDE 04/30/2022 [facility]   BREAST BIOPSY  07/17/2022   MM LT RADIOACTIVE SEED LOC MAMMO GUIDE 07/17/2022 [facility]   BREAST BIOPSY  07/17/2022   MM LT RADIOACTIVE SEED EA ADD LESION LOC MAMMO GUIDE 07/17/2022 [facility]   BREAST BIOPSY  07/17/2022   MM RT RADIOACTIVE SEED LOC MAMMO GUIDE 07/17/2022 [facility]    BREAST LUMPECTOMY WITH RADIOACTIVE SEED AND SENTINEL LYMPH NODE BIOPSY Bilateral 07/19/2022   Procedure: BILATERAL BREAST LUMPECTOMY WITH RADIOACTIVE SEED AND BILATERAL SENTINEL LYMPH NODE BIOPSY;  Surgeon: Aron Shoulders, MD;  Location: [facility];  Service: General;  Laterality: Bilateral;   IR ANGIO INTRA EXTRACRAN SEL COM CAROTID INNOMINATE UNI L MOD SED  08/20/2023   IR ANGIO INTRA EXTRACRAN SEL INTERNAL CAROTID UNI R MOD SED  08/20/2023   IR ANGIO INTRA EXTRACRAN SEL INTERNAL CAROTID UNI R MOD SED  09/19/2023   IR ANGIO INTRA EXTRACRAN SEL INTERNAL CAROTID UNI R MOD SED  03/24/2024   IR ANGIO VERTEBRAL SEL VERTEBRAL UNI L MOD SED  08/20/2023   IR ANGIOGRAM FOLLOW UP STUDY  09/19/2023   IR TRANSCATH/EMBOLIZ  09/19/2023   IR US  GUIDE VASC ACCESS RIGHT  03/24/2024   LEFT HEART CATH  12/12/2006   PORT-A-CATH REMOVAL N/A 08/06/2023   Procedure: REMOVAL PORT-A-CATH;  Surgeon: Aron Shoulders, MD;  Location: [facility];  Service: General;  Laterality: N/A;   PORTACATH PLACEMENT Left 07/19/2022   Procedure: INSERTION PORT-A-CATH;  Surgeon: Aron Shoulders, MD;  Location: [facility];  Service: General;  Laterality: Left;   RADIOLOGY WITH ANESTHESIA N/A 09/19/2023   Procedure: RADIOLOGY WITH ANESTHESIA;  Surgeon: Lanis Pupa, MD;  Location: [facility];  Service: Radiology;  Laterality: N/A;  Pipeline Embolization of right ICA aneurysm    SOCIAL HISTORY: Social History   Socioeconomic History   Marital status: Married    Spouse name: Not on file   Number of children: Not on file   Years of education: Not on file   Highest education level: Not on file  Occupational History   Not on file  Tobacco Use   Smoking status: Former   Smokeless tobacco: Never  Vaping Use   Vaping status: Never Used  Substance and Sexual Activity   Alcohol use: No   Drug use: No   Sexual activity: Yes    Birth control/protection: Surgical    Comment: hyst  Other Topics Concern   Not on  file  Social History Narrative   Not on file   Social Drivers of Health   Financial Resource Strain: Low Risk  (04/14/2024)   Overall Financial Resource Strain (CARDIA)    Difficulty of Paying Living Expenses: Not hard at all  Food Insecurity: No Food Insecurity (04/14/2024)   Hunger Vital Sign    Worried About Running Out of Food in the Last Year: Never true    Ran Out of Food in the Last Year: Never true  Transportation Needs: No Transportation Needs (04/14/2024)   PRAPARE - Administrator, Civil Service (Medical): No    Lack of Transportation (Non-Medical): No  Physical Activity: Inactive (04/14/2024)   Exercise Vital Sign    Days  of Exercise per Week: 0 days    Minutes of Exercise per Session: 0 min  Stress: No Stress Concern Present (04/14/2024)   [employer] of Occupational Health - Occupational Stress Questionnaire    Feeling of Stress: Not at all  Social Connections: Moderately Isolated (04/14/2024)   Social Connection and Isolation Panel    Frequency of Communication with Friends and Family: More than three times a week    Frequency of Social Gatherings with Friends and Family: More than three times a week    Attends Religious Services: Never    Database Administrator or Organizations: No    Attends Banker Meetings: Never    Marital Status: Married  Catering Manager Violence: Not At Risk (04/14/2024)   Humiliation, Afraid, Rape, and Kick questionnaire    Fear of Current or Ex-Partner: No    Emotionally Abused: No    Physically Abused: No    Sexually Abused: No    FAMILY HISTORY: Family History  Problem Relation Age of Onset   Breast cancer Mother 13   Hypertension Mother    Stroke Mother    Kidney disease Father    Kidney cancer Brother 43   Colon cancer Maternal Uncle 38   Breast cancer Cousin        maternal first cousin, dx 30s    Review of Systems  Constitutional:  Negative for appetite change, chills, fatigue, fever and  unexpected weight change.  HENT:   Negative for hearing loss, lump/mass and trouble swallowing.   Eyes:  Negative for eye problems and icterus.  Respiratory:  Negative for chest tightness, cough and shortness of breath.   Cardiovascular:  Negative for chest pain, leg swelling and palpitations.  Gastrointestinal:  Positive for constipation. Negative for abdominal distention, abdominal pain, blood in stool, diarrhea, nausea, rectal pain and vomiting.  Endocrine: Negative for hot flashes.  Genitourinary:  Negative for difficulty urinating.   Musculoskeletal:  Negative for arthralgias.  Skin:  Negative for itching and rash.  Neurological:  Negative for dizziness, extremity weakness, headaches and numbness.  Hematological:  Negative for adenopathy. Does not bruise/bleed easily.  Psychiatric/Behavioral:  Negative for depression. The patient is not nervous/anxious.       PHYSICAL EXAMINATION   Onc Performance Status - 04/14/24 1056       ECOG Perf Status   ECOG Perf Status Restricted in physically strenuous activity but ambulatory and able to carry out work of a light or sedentary nature, e.g., light house work, office work      KPS SCALE   KPS % SCORE Able to carry on normal activity, minor s/s of disease          Vitals:   04/14/24 1052  BP: (!) 109/47  Pulse: (!) 56  Resp: 18  Temp: (!) 97.3 F (36.3 C)  SpO2: 100%    Physical Exam Chest:  Breasts:    Right: No swelling or bleeding.     Left: No swelling or bleeding.     Comments: Left breast s/p lumpectomy and radiation, no sign of local recurrence; right breast s/p lumpectomy and radiation, no sign of local recurrence.     LABORATORY DATA:  CBC    Component Value Date/Time   WBC 9.5 03/24/2024 0740   RBC 3.83 (L) 03/24/2024 0740   HGB 10.8 (L) 03/24/2024 0740   HGB 10.8 (L) 07/16/2023 0910   HCT 33.9 (L) 03/24/2024 0740   PLT 330 03/24/2024 0740   PLT 347 07/16/2023  0910   MCV 88.5 03/24/2024 0740   MCH  28.2 03/24/2024 0740   MCHC 31.9 03/24/2024 0740   RDW 14.2 03/24/2024 0740   LYMPHSABS 2.5 03/24/2024 0740   MONOABS 0.8 03/24/2024 0740   EOSABS 0.1 03/24/2024 0740   BASOSABS 0.0 03/24/2024 0740    CMP     Component Value Date/Time   NA 140 03/24/2024 0740   K 4.3 03/24/2024 0740   CL 103 03/24/2024 0740   CO2 26 03/24/2024 0740   GLUCOSE 96 03/24/2024 0740   BUN 26 (H) 03/24/2024 0740   CREATININE 1.24 (H) 03/24/2024 0740   CREATININE 1.24 (H) 07/16/2023 0910   CALCIUM 9.2 03/24/2024 0740   PROT 7.1 07/16/2023 0910   ALBUMIN 3.8 07/16/2023 0910   AST 10 (L) 07/16/2023 0910   ALT 11 07/16/2023 0910   ALKPHOS 101 07/16/2023 0910   BILITOT 0.7 07/16/2023 0910   GFRNONAA 47 (L) 03/24/2024 0740   GFRNONAA 47 (L) 07/16/2023 0910   GFRAA 81 (L) 03/23/2013 1443     ASSESSMENT and THERAPY PLAN:   No problem-specific Assessment & Plan notes found for this encounter.   Assessment and Plan Assessment & Plan Bilateral stage IA invasive breast cancer, ER/PR positive, HER2 positive (left), HER2 negative (right). Status post bilateral lumpectomies, adjuvant chemotherapy with Taxol  and Herceptin , maintenance Herceptin , adjuvant radiation, and currently on letrozole . Recent imaging shows no malignancy. Discussed Guardant Reveal blood test for recurrence detection.  - Continue letrozole  daily. - Ordered mammogram for April and MRI for September. - Will order Guardant Reveal blood test  - Scheduled follow-up in six months. - Will obtain labs from PCP and may potentially order additional testing for next week as well.    Constipation Chronic constipation worsened by vitamin D. Over-the-counter remedies ineffective. Low iron levels, but oral iron not advised due to constipation risk. Referral to GI specialist considered. - Referred to GI specialist in [facility] at her request. - Ensure adequate hydration. - Recommended she keep a stool diary to document her OTC meds and water  intake prior to her GI appointment.    All questions were answered. The patient knows to call the clinic with any problems, questions or concerns. We can certainly see the patient much sooner if necessary.  Total encounter time:40 minutes*in face-to-face visit time, chart review, lab review, care coordination, order entry, and documentation of the encounter time.    Morna Kendall, NP 04/14/24 11:15 AM Medical Oncology and Hematology [facility] [address] [place], [name] [address] Tel. [phone]    Fax. [phone]  *Total Encounter Time as defined by the Centers for Medicare and Medicaid Services includes, in addition to the face-to-face time of a patient visit (documented in the note above) non-face-to-face time: obtaining and reviewing outside history, ordering and reviewing medications, tests or procedures, care coordination (communications with other health care professionals or caregivers) and documentation in the medical record.

## 2024-04-15 ENCOUNTER — Other Ambulatory Visit: Payer: Self-pay | Admitting: Adult Health

## 2024-04-15 DIAGNOSIS — D649 Anemia, unspecified: Secondary | ICD-10-CM

## 2024-04-15 DIAGNOSIS — C50411 Malignant neoplasm of upper-outer quadrant of right female breast: Secondary | ICD-10-CM

## 2024-04-15 DIAGNOSIS — C50412 Malignant neoplasm of upper-outer quadrant of left female breast: Secondary | ICD-10-CM

## 2024-04-21 ENCOUNTER — Inpatient Hospital Stay

## 2024-04-21 ENCOUNTER — Encounter: Payer: Self-pay | Admitting: Internal Medicine

## 2024-04-21 DIAGNOSIS — Z170 Estrogen receptor positive status [ER+]: Secondary | ICD-10-CM

## 2024-04-21 DIAGNOSIS — D649 Anemia, unspecified: Secondary | ICD-10-CM

## 2024-04-21 DIAGNOSIS — C50412 Malignant neoplasm of upper-outer quadrant of left female breast: Secondary | ICD-10-CM | POA: Diagnosis not present

## 2024-04-21 LAB — CBC WITH DIFFERENTIAL (CANCER CENTER ONLY)
Abs Immature Granulocytes: 0.02 K/uL (ref 0.00–0.07)
Basophils Absolute: 0.1 K/uL (ref 0.0–0.1)
Basophils Relative: 1 %
Eosinophils Absolute: 0.1 K/uL (ref 0.0–0.5)
Eosinophils Relative: 2 %
HCT: 33.4 % — ABNORMAL LOW (ref 36.0–46.0)
Hemoglobin: 10.9 g/dL — ABNORMAL LOW (ref 12.0–15.0)
Immature Granulocytes: 0 %
Lymphocytes Relative: 30 %
Lymphs Abs: 1.9 K/uL (ref 0.7–4.0)
MCH: 28.4 pg (ref 26.0–34.0)
MCHC: 32.6 g/dL (ref 30.0–36.0)
MCV: 87 fL (ref 80.0–100.0)
Monocytes Absolute: 0.5 K/uL (ref 0.1–1.0)
Monocytes Relative: 8 %
Neutro Abs: 3.9 K/uL (ref 1.7–7.7)
Neutrophils Relative %: 59 %
Platelet Count: 320 K/uL (ref 150–400)
RBC: 3.84 MIL/uL — ABNORMAL LOW (ref 3.87–5.11)
RDW: 13.8 % (ref 11.5–15.5)
WBC Count: 6.5 K/uL (ref 4.0–10.5)
nRBC: 0 % (ref 0.0–0.2)

## 2024-04-21 LAB — CMP (CANCER CENTER ONLY)
ALT: 10 U/L (ref 0–44)
AST: 15 U/L (ref 15–41)
Albumin: 4.2 g/dL (ref 3.5–5.0)
Alkaline Phosphatase: 118 U/L (ref 38–126)
Anion gap: 11 (ref 5–15)
BUN: 20 mg/dL (ref 8–23)
CO2: 26 mmol/L (ref 22–32)
Calcium: 10 mg/dL (ref 8.9–10.3)
Chloride: 100 mmol/L (ref 98–111)
Creatinine: 1.16 mg/dL — ABNORMAL HIGH (ref 0.44–1.00)
GFR, Estimated: 51 mL/min — ABNORMAL LOW (ref >60)
Glucose, Bld: 99 mg/dL (ref 70–99)
Potassium: 3.8 mmol/L (ref 3.5–5.1)
Sodium: 137 mmol/L (ref 135–145)
Total Bilirubin: 0.6 mg/dL (ref 0.0–1.2)
Total Protein: 7.8 g/dL (ref 6.5–8.1)

## 2024-04-21 LAB — FERRITIN: Ferritin: 41 ng/mL (ref 11–307)

## 2024-04-21 LAB — IRON AND IRON BINDING CAPACITY (CC-WL,HP ONLY)
Iron: 43 ug/dL (ref 28–170)
Saturation Ratios: 11 % (ref 10.4–31.8)
TIBC: 384 ug/dL (ref 250–450)
UIBC: 341 ug/dL

## 2024-04-21 LAB — FOLATE: Folate: 9.8 ng/mL (ref >5.9)

## 2024-04-21 LAB — VITAMIN B12: Vitamin B-12: 358 pg/mL (ref 180–914)

## 2024-04-22 LAB — GUARDANT REVEAL

## 2024-04-22 LAB — PROTEIN ELECTROPHORESIS, SERUM, WITH REFLEX
A/G Ratio: 0.9 (ref 0.7–1.7)
Albumin ELP: 3.4 g/dL (ref 2.9–4.4)
Alpha-1-Globulin: 0.3 g/dL (ref 0.0–0.4)
Alpha-2-Globulin: 0.7 g/dL (ref 0.4–1.0)
Beta Globulin: 1.3 g/dL (ref 0.7–1.3)
Gamma Globulin: 1.2 g/dL (ref 0.4–1.8)
Globulin, Total: 3.6 g/dL (ref 2.2–3.9)
Total Protein ELP: 7 g/dL (ref 6.0–8.5)

## 2024-04-28 ENCOUNTER — Encounter: Payer: Self-pay | Admitting: Adult Health

## 2024-05-01 ENCOUNTER — Ambulatory Visit (HOSPITAL_COMMUNITY)
Admission: RE | Admit: 2024-05-01 | Discharge: 2024-05-01 | Disposition: A | Discharge status: Home / Self Care | Source: Ambulatory Visit | Attending: Adult Health | Admitting: Adult Health

## 2024-05-01 ENCOUNTER — Other Ambulatory Visit: Payer: Self-pay | Admitting: Internal Medicine

## 2024-05-01 DIAGNOSIS — Z780 Asymptomatic menopausal state: Secondary | ICD-10-CM | POA: Diagnosis not present

## 2024-05-01 DIAGNOSIS — Z79890 Hormone replacement therapy (postmenopausal): Secondary | ICD-10-CM | POA: Insufficient documentation

## 2024-05-01 DIAGNOSIS — Z13820 Encounter for screening for osteoporosis: Secondary | ICD-10-CM | POA: Diagnosis not present

## 2024-05-01 DIAGNOSIS — Z79811 Long term (current) use of aromatase inhibitors: Secondary | ICD-10-CM | POA: Diagnosis present

## 2024-05-01 DIAGNOSIS — M810 Age-related osteoporosis without current pathological fracture: Secondary | ICD-10-CM | POA: Insufficient documentation

## 2024-05-04 ENCOUNTER — Ambulatory Visit: Payer: Self-pay

## 2024-05-04 NOTE — Telephone Encounter (Signed)
-----   Message from Morna Kendall, NP sent at 05/04/2024 12:53 PM EST ----- Patient bone density is consistent with osteopenia.  I recommend repeating testing in 2 years time and continuing with calcium vitamin D and weightbearing exercises.

## 2024-06-02 ENCOUNTER — Encounter (INDEPENDENT_AMBULATORY_CARE_PROVIDER_SITE_OTHER): Payer: Self-pay | Admitting: *Deleted

## 2024-06-05 ENCOUNTER — Other Ambulatory Visit: Payer: Self-pay | Admitting: Internal Medicine

## 2024-07-02 ENCOUNTER — Ambulatory Visit: Admitting: Internal Medicine

## 2024-10-19 ENCOUNTER — Inpatient Hospital Stay: Attending: Adult Health

## 2024-10-19 ENCOUNTER — Inpatient Hospital Stay: Admitting: Hematology and Oncology
